# Patient Record
Sex: Male | Born: 1970 | Race: Black or African American | Hispanic: No | Marital: Single | Smoking: Former smoker
Health system: Southern US, Community
[De-identification: ages and names within clinical notes are randomized; demographics above are authoritative.]

## PROBLEM LIST (undated history)

## (undated) DIAGNOSIS — E119 Type 2 diabetes mellitus without complications: Secondary | ICD-10-CM

## (undated) DIAGNOSIS — K219 Gastro-esophageal reflux disease without esophagitis: Secondary | ICD-10-CM

## (undated) DIAGNOSIS — E782 Mixed hyperlipidemia: Secondary | ICD-10-CM

## (undated) DIAGNOSIS — I10 Essential (primary) hypertension: Secondary | ICD-10-CM

## (undated) DIAGNOSIS — G4730 Sleep apnea, unspecified: Secondary | ICD-10-CM

## (undated) HISTORY — PX: KNEE SURGERY: SHX244

---

## 2011-01-15 ENCOUNTER — Emergency Department (HOSPITAL_BASED_OUTPATIENT_CLINIC_OR_DEPARTMENT_OTHER)
Admission: EM | Admit: 2011-01-15 | Discharge: 2011-01-16 | Disposition: A | Discharge status: Home / Self Care | Payer: Self-pay | Attending: Emergency Medicine | Admitting: Emergency Medicine

## 2011-01-15 ENCOUNTER — Encounter: Payer: Self-pay | Admitting: *Deleted

## 2011-01-15 DIAGNOSIS — K219 Gastro-esophageal reflux disease without esophagitis: Secondary | ICD-10-CM | POA: Insufficient documentation

## 2011-01-15 DIAGNOSIS — R350 Frequency of micturition: Secondary | ICD-10-CM | POA: Insufficient documentation

## 2011-01-15 DIAGNOSIS — Z202 Contact with and (suspected) exposure to infections with a predominantly sexual mode of transmission: Secondary | ICD-10-CM | POA: Insufficient documentation

## 2011-01-15 DIAGNOSIS — I10 Essential (primary) hypertension: Secondary | ICD-10-CM | POA: Insufficient documentation

## 2011-01-15 HISTORY — DX: Gastro-esophageal reflux disease without esophagitis: K21.9

## 2011-01-15 HISTORY — DX: Essential (primary) hypertension: I10

## 2011-01-15 LAB — GLUCOSE, CAPILLARY: Glucose-Capillary: 91 mg/dL (ref 70–99)

## 2011-01-15 LAB — URINALYSIS, ROUTINE W REFLEX MICROSCOPIC
Bilirubin Urine: NEGATIVE
Glucose, UA: NEGATIVE mg/dL
Hgb urine dipstick: NEGATIVE
Protein, ur: NEGATIVE mg/dL
Urobilinogen, UA: 1 mg/dL (ref 0.0–1.0)

## 2011-01-15 NOTE — ED Notes (Signed)
Pt c/o bilateral side pain and urinary symptoms

## 2011-01-16 MED ORDER — AZITHROMYCIN 250 MG PO TABS
1000.0000 mg | ORAL_TABLET | Freq: Once | ORAL | Status: AC
  Administered 2011-01-16: 1000 mg via ORAL
  Filled 2011-01-16: qty 4

## 2011-01-16 MED ORDER — METRONIDAZOLE 500 MG PO TABS
2000.0000 mg | ORAL_TABLET | Freq: Once | ORAL | Status: AC
  Administered 2011-01-16: 2000 mg via ORAL
  Filled 2011-01-16: qty 4

## 2011-01-16 MED ORDER — CEFTRIAXONE SODIUM 250 MG IJ SOLR
250.0000 mg | Freq: Once | INTRAMUSCULAR | Status: AC
  Administered 2011-01-16: 250 mg via INTRAMUSCULAR
  Filled 2011-01-16: qty 250

## 2011-01-16 NOTE — ED Provider Notes (Signed)
Scribed for Hanley Seamen, MD, the patient was seen in room MH06/MH06 . This chart was scribed by Ellie Lunch.   CSN: [id number] Arrival date & time: 01/15/2011 11:10 PM   First MD Initiated Contact with Patient 01/16/11 0017      Chief Complaint  Patient presents with  . Urinary Frequency    (Consider location/radiation/quality/duration/timing/severity/associated sxs/prior treatment) HPI Cody [name] is a 40 y.o. male who presents to the Emergency Department complaining of dysuria. Pt reports that his partner dx with trichomoniasis last week. He is here to be tested for trichomoniasis and other STD's. Pt reports dysuria with associated pain in side and lower abdominal pain. Denies penile discharge, nausea, and vomiting. There are no other associated symptoms and no other alleviating or aggravating factors.    Past Medical History  Diagnosis Date  . Hypertension   . Acid reflux     History reviewed. No pertinent past surgical history.  History reviewed. No pertinent family history.  History  Substance Use Topics  . Smoking status: Current Everyday Smoker  . Smokeless tobacco: Not on file  . Alcohol Use: Yes    Review of Systems  Gastrointestinal: Positive for abdominal pain. Negative for nausea and vomiting.  Genitourinary: Positive for dysuria. Negative for discharge.  All other systems reviewed and are negative.    Allergies  Review of patient's allergies indicates no known allergies.  Home Medications   Current Outpatient Rx  Name Route Sig Dispense Refill  . LISINOPRIL-HYDROCHLOROTHIAZIDE 20-25 MG PO TABS Oral Take 1 tablet by mouth daily.      Marland Kitchen RANITIDINE HCL 150 MG PO TABS Oral Take 150 mg by mouth daily.        BP 149/93  Pulse 74  Temp(Src) 98 F (36.7 C) (Oral)  Resp 18  Ht 6\' 2"  (1.88 m)  Wt 300 lb (136.079 kg)  BMI 38.52 kg/m2  SpO2 100%  Physical Exam  Nursing note and vitals reviewed. Constitutional: He is oriented to person, place, and  time. He appears well-developed and well-nourished. No distress.  HENT:  Head: Normocephalic and atraumatic.  Eyes: Conjunctivae and EOM are normal.  Neck: Neck supple.  Cardiovascular: Normal rate, regular rhythm and normal heart sounds.   Pulmonary/Chest: Effort normal and breath sounds normal. No accessory muscle usage. No respiratory distress.  Abdominal: Soft. There is tenderness.       suprapubic tenderness  Genitourinary:       Normal external genital exam. Uncircumcised. No penile discharge.   Musculoskeletal: Normal range of motion.  Neurological: He is alert and oriented to person, place, and time.  Skin: Skin is warm and dry.  Psychiatric: He has a normal mood and affect.    ED Course  Procedures (including critical care time)  DIAGNOSTIC STUDIES: Oxygen Saturation is 100% on room air, normal by my interpretation.     MDM   Nursing notes and vitals signs, including pulse oximetry, reviewed.  Summary of this visit's results, reviewed by myself:  Labs:  Results for orders placed during the hospital encounter of 01/15/11  URINALYSIS, ROUTINE W REFLEX MICROSCOPIC      Component Value Range   Color, Urine YELLOW  YELLOW    Appearance CLEAR  CLEAR    Specific Gravity, Urine 1.019  1.005 - 1.030    pH 6.5  5.0 - 8.0    Glucose, UA NEGATIVE  NEGATIVE (mg/dL)   Hgb urine dipstick NEGATIVE  NEGATIVE    Bilirubin Urine NEGATIVE  NEGATIVE  Ketones, ur NEGATIVE  NEGATIVE (mg/dL)   Protein, ur NEGATIVE  NEGATIVE (mg/dL)   Urobilinogen, UA 1.0  0.0 - 1.0 (mg/dL)   Nitrite NEGATIVE  NEGATIVE    Leukocytes, UA NEGATIVE  NEGATIVE   GLUCOSE, CAPILLARY      Component Value Range   Glucose-Capillary 91  70 - 99 (mg/dL)   Imaging Studies: No results found.  I personally performed the services described in this documentation, which was scribed in my presence.  The recorded information has been reviewed and considered.  We'll treat for trichomoniasis given that his  girlfriend was diagnosed with this. His girlfriend is also on doxycycline suggesting that she was either diagnosed with, or suspect of having, chlamydia. We will thus treat for GC and Chlamydia as well.     Hanley Seamen, MD 01/16/11 [phone]

## 2011-01-17 LAB — GC/CHLAMYDIA PROBE AMP, GENITAL: GC Probe Amp, Genital: NEGATIVE

## 2015-08-19 ENCOUNTER — Emergency Department (HOSPITAL_BASED_OUTPATIENT_CLINIC_OR_DEPARTMENT_OTHER): Payer: No Typology Code available for payment source

## 2015-08-19 ENCOUNTER — Emergency Department (HOSPITAL_BASED_OUTPATIENT_CLINIC_OR_DEPARTMENT_OTHER)
Admission: EM | Admit: 2015-08-19 | Discharge: 2015-08-19 | Disposition: A | Discharge status: Home / Self Care | Payer: No Typology Code available for payment source | Attending: Emergency Medicine | Admitting: Emergency Medicine

## 2015-08-19 ENCOUNTER — Encounter (HOSPITAL_BASED_OUTPATIENT_CLINIC_OR_DEPARTMENT_OTHER): Payer: Self-pay

## 2015-08-19 DIAGNOSIS — Y999 Unspecified external cause status: Secondary | ICD-10-CM | POA: Diagnosis not present

## 2015-08-19 DIAGNOSIS — Z87891 Personal history of nicotine dependence: Secondary | ICD-10-CM | POA: Insufficient documentation

## 2015-08-19 DIAGNOSIS — I10 Essential (primary) hypertension: Secondary | ICD-10-CM | POA: Insufficient documentation

## 2015-08-19 DIAGNOSIS — M549 Dorsalgia, unspecified: Secondary | ICD-10-CM

## 2015-08-19 DIAGNOSIS — Y92410 Unspecified street and highway as the place of occurrence of the external cause: Secondary | ICD-10-CM | POA: Insufficient documentation

## 2015-08-19 DIAGNOSIS — M542 Cervicalgia: Secondary | ICD-10-CM | POA: Insufficient documentation

## 2015-08-19 DIAGNOSIS — Y939 Activity, unspecified: Secondary | ICD-10-CM | POA: Insufficient documentation

## 2015-08-19 MED ORDER — METHOCARBAMOL 500 MG PO TABS: 500.0000 mg | ORAL_TABLET | Freq: Four times a day (QID) | ORAL | Status: DC | PRN

## 2015-08-19 MED ORDER — IBUPROFEN 800 MG PO TABS: 800.0000 mg | ORAL_TABLET | Freq: Three times a day (TID) | ORAL | Status: DC | PRN

## 2015-08-19 MED ORDER — KETOROLAC TROMETHAMINE 60 MG/2ML IM SOLN
60.0000 mg | Freq: Once | INTRAMUSCULAR | Status: AC
  Administered 2015-08-19: 60 mg via INTRAMUSCULAR
  Filled 2015-08-19: qty 2
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
## 2015-08-19 NOTE — ED Notes (Signed)
PA at bedside.
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
## 2015-12-03 ENCOUNTER — Emergency Department (HOSPITAL_BASED_OUTPATIENT_CLINIC_OR_DEPARTMENT_OTHER)
Admission: EM | Admit: 2015-12-03 | Discharge: 2015-12-03 | Disposition: A | Discharge status: Home / Self Care | Payer: Self-pay | Attending: Emergency Medicine | Admitting: Emergency Medicine

## 2015-12-03 ENCOUNTER — Encounter (HOSPITAL_BASED_OUTPATIENT_CLINIC_OR_DEPARTMENT_OTHER): Payer: Self-pay | Admitting: Adult Health

## 2015-12-03 DIAGNOSIS — Z79899 Other long term (current) drug therapy: Secondary | ICD-10-CM | POA: Insufficient documentation

## 2015-12-03 DIAGNOSIS — I10 Essential (primary) hypertension: Secondary | ICD-10-CM | POA: Insufficient documentation

## 2015-12-03 DIAGNOSIS — R0602 Shortness of breath: Secondary | ICD-10-CM | POA: Insufficient documentation

## 2015-12-03 DIAGNOSIS — Z87891 Personal history of nicotine dependence: Secondary | ICD-10-CM | POA: Insufficient documentation

## 2015-12-03 DIAGNOSIS — L03115 Cellulitis of right lower limb: Secondary | ICD-10-CM | POA: Insufficient documentation

## 2015-12-03 LAB — BASIC METABOLIC PANEL
ANION GAP: 8 (ref 5–15)
BUN: 16 mg/dL (ref 6–20)
CALCIUM: 9.5 mg/dL (ref 8.9–10.3)
CO2: 26 mmol/L (ref 22–32)
Chloride: 102 mmol/L (ref 101–111)
Creatinine, Ser: 1.41 mg/dL — ABNORMAL HIGH (ref 0.61–1.24)
GFR, EST NON AFRICAN AMERICAN: 58 mL/min — AB (ref >60)
Glucose, Bld: 120 mg/dL — ABNORMAL HIGH (ref 65–99)
POTASSIUM: 4 mmol/L (ref 3.5–5.1)
Sodium: 136 mmol/L (ref 135–145)

## 2015-12-03 LAB — SEDIMENTATION RATE: Sed Rate: 1 mm/hr (ref 0–16)

## 2015-12-03 LAB — CBC WITH DIFFERENTIAL/PLATELET
BASOS ABS: 0 10*3/uL (ref 0.0–0.1)
Basophils Relative: 0 %
EOS PCT: 0 %
Eosinophils Absolute: 0 10*3/uL (ref 0.0–0.7)
HEMATOCRIT: 39.6 % (ref 39.0–52.0)
Hemoglobin: 13.7 g/dL (ref 13.0–17.0)
LYMPHS ABS: 0.9 10*3/uL (ref 0.7–4.0)
Lymphocytes Relative: 7 %
MCH: 32 pg (ref 26.0–34.0)
MCHC: 34.6 g/dL (ref 30.0–36.0)
MCV: 92.5 fL (ref 78.0–100.0)
MONO ABS: 0.5 10*3/uL (ref 0.1–1.0)
MONOS PCT: 4 %
NEUTROS PCT: 89 %
Neutro Abs: 11.9 10*3/uL — ABNORMAL HIGH (ref 1.7–7.7)
PLATELETS: 88 10*3/uL — AB (ref 150–400)
RBC: 4.28 MIL/uL (ref 4.22–5.81)
RDW: 12.6 % (ref 11.5–15.5)
WBC: 13.3 10*3/uL — AB (ref 4.0–10.5)

## 2015-12-03 MED ORDER — VANCOMYCIN HCL 10 G IV SOLR
2000.0000 mg | Freq: Once | INTRAVENOUS | Status: DC
  Filled 2015-12-03: qty 2000

## 2015-12-03 MED ORDER — DOXYCYCLINE HYCLATE 100 MG PO CAPS: 100.0000 mg | ORAL_CAPSULE | Freq: Two times a day (BID) | ORAL | 0 refills | Status: DC

## 2015-12-03 MED ORDER — ACETAMINOPHEN 500 MG PO TABS
1000.0000 mg | ORAL_TABLET | Freq: Once | ORAL | Status: AC
  Administered 2015-12-03: 1000 mg via ORAL
  Filled 2015-12-03: qty 2

## 2015-12-03 MED ORDER — CEPHALEXIN 500 MG PO CAPS: 500.0000 mg | ORAL_CAPSULE | Freq: Three times a day (TID) | ORAL | 0 refills | Status: DC

## 2015-12-03 MED ORDER — VANCOMYCIN HCL IN DEXTROSE 1-5 GM/200ML-% IV SOLN
1000.0000 mg | INTRAVENOUS | Status: AC
  Administered 2015-12-03 (×2): 1000 mg via INTRAVENOUS
  Filled 2015-12-03 (×2): qty 200

## 2015-12-03 NOTE — ED Provider Notes (Signed)
[facility] Provider Note   CSN: [id number] Arrival date & time: 12/03/15  1748  By signing my name below, I, Cody [name], attest that this documentation has been prepared under the direction and in the presence of Lyndal Pulley, MD . Electronically Signed: Nelwyn [name], Scribe. 12/03/2015. 6:17 PM.   History   Chief Complaint Chief Complaint  Patient presents with  . Leg Pain   The history is provided by the patient. No language interpreter was used.  Leg Pain   This is a new problem. The current episode started more than 2 days ago. The problem occurs constantly. The problem has been rapidly worsening. The pain is present in the right upper leg. The quality of the pain is described as constant ("Like my leg is on fire"). The pain is moderate. The symptoms are aggravated by activity and contact.   HPI Comments:  Cody [name] is a 45 y.o. male with PMHx of HTN who presents to the Emergency Department complaining of worsening constant right leg pain onset 5 days ago. He describes his pain as "like [his] leg is on fire", exacerbated by movement and palpation and rapidly worsening today. He endorses associated swelling, fever, nausea, and shortness of breath. Pt denies any hx of DM or kidney problems.   Past Medical History:  Diagnosis Date  . Acid reflux   . Hypertension     There are no active problems to display for this patient.   Past Surgical History:  Procedure Laterality Date  . KNEE SURGERY       Home Medications    Prior to Admission medications   Medication Sig Start Date End Date Taking? Authorizing Provider  ibuprofen (ADVIL,MOTRIN) 800 MG tablet Take 1 tablet (800 mg total) by mouth every 8 (eight) hours as needed. 08/19/15   Trixie Dredge, PA-C  lisinopril-hydrochlorothiazide (PRINZIDE,ZESTORETIC) 20-25 MG per tablet Take 1 tablet by mouth daily.      Historical Provider, MD  methocarbamol (ROBAXIN) 500 MG tablet Take 1-2 tablets (500-1,000 mg total) by  mouth every 6 (six) hours as needed for muscle spasms (and pain). 08/19/15   Trixie Dredge, PA-C  ranitidine (ZANTAC) 150 MG tablet Take 150 mg by mouth daily.      Historical Provider, MD    Family History History reviewed. No pertinent family history.  Social History Social History  Substance Use Topics  . Smoking status: Former Games developer  . Smokeless tobacco: Not on file  . Alcohol use No    Allergies   Review of patient's allergies indicates no known allergies.   Review of Systems Review of Systems  Constitutional: Positive for fever.  Respiratory: Positive for shortness of breath.   Gastrointestinal: Positive for nausea.  Musculoskeletal: Positive for arthralgias and myalgias.       Positive for Swelling  All other systems reviewed and are negative.    Physical Exam Updated Vital Signs BP (!) 185/110 (BP Location: Right Arm)   Pulse 87   Temp 100.8 F (38.2 C) (Oral)   Resp 22   SpO2 96%   Physical Exam  Constitutional: He appears well-developed and well-nourished.  HENT:  Head: Normocephalic and atraumatic.  Eyes: Conjunctivae are normal.  Neck: Neck supple.  Cardiovascular: Normal rate and regular rhythm.   No murmur heard. Pulmonary/Chest: No respiratory distress.  Abdominal: Soft. There is no tenderness.  Musculoskeletal: He exhibits no edema.  Neurological: He is alert.  Skin: Skin is warm and dry. There is erythema.  8cm area of  induration and erythema that is well demarcated extending to the posterior thigh from medial thigh. Tenderness over hamstring muscles.  Psychiatric: He has a normal mood and affect.  Nursing note and vitals reviewed.    ED Treatments / Results  DIAGNOSTIC STUDIES:  Oxygen Saturation is 96% on RA, adequate by my interpretation.    COORDINATION OF CARE:  7:03 PM Discussed treatment plan with pt at bedside which included imaging and antibiotics and pt agreed to plan.  Labs (all labs ordered are listed, but only abnormal  results are displayed) Labs Reviewed  CBC WITH DIFFERENTIAL/PLATELET - Abnormal; Notable for the following:       Result Value   WBC 13.3 (*)    Platelets 88 (*)    Neutro Abs 11.9 (*)    All other components within normal limits  BASIC METABOLIC PANEL - Abnormal; Notable for the following:    Glucose, Bld 120 (*)    Creatinine, Ser 1.41 (*)    GFR calc non Af Amer 58 (*)    All other components within normal limits  SEDIMENTATION RATE  C-REACTIVE PROTEIN    EKG  EKG Interpretation None       Radiology No results found.  Procedures Procedures (including critical care time)  Emergency Focused Ultrasound Exam Limited Ultrasound of Soft Tissue   Performed and interpreted by Dr. Clydene PughKnott Indication: evaluation for infection or foreign body Transverse and Sagittal views of right thigh are obtained in real time for the purposes of evaluation of skin and underlying soft tissues.  Findings: no heterogeneous fluid collection, + hyperemia/edema of surrounding tissue Interpretation: no abscess, + cellulitis Images archived electronically.  CPT Codes:   Lower extremity [phone]    Medications Ordered in ED Medications  acetaminophen (TYLENOL) tablet 1,000 mg (1,000 mg Oral Given 12/03/15 1917)  vancomycin (VANCOCIN) IVPB 1000 mg/200 mL premix (0 mg Intravenous Stopped 12/03/15 2202)     Initial Impression / Assessment and Plan / ED Course  I have reviewed the triage vital signs and the nursing notes.  Pertinent labs & imaging results that were available during my care of the patient were reviewed by me and considered in my medical decision making (see chart for details).  Clinical Course    45 y.o. male presents with apparent cellulitis of right medial thigh extending posteriorly. Progressed over last few days. No indication of traumatic onset. Demarcated area as pictured above has been spreading today. Distribution not c/w DVT but considered, after hours for doppler  evaluation which is not available. No elevation of inflammatory markers but low grade fever and WBC evident today. No crepitus or extension to groin to suggest nec fasc or fournier's. Dose of IV vancomycin provided here. Will place on doxy and keflex for double coverage of cellulitis. I recommended he return in 24 hours or less for recheck to monitor progress on treatment, would consider serial visits to monitor progression or admission for IV ABx if clinically worsening.   Final Clinical Impressions(s) / ED Diagnoses   Final diagnoses:  Cellulitis of right leg without foot    New Prescriptions Discharge Medication List as of 12/03/2015  9:18 PM    START taking these medications   Details  cephALEXin (KEFLEX) 500 MG capsule Take 1 capsule (500 mg total) by mouth 3 (three) times daily., Starting Sat 12/03/2015, Until Tue 12/13/2015, Print    doxycycline (VIBRAMYCIN) 100 MG capsule Take 1 capsule (100 mg total) by mouth 2 (two) times daily., Starting Sat 12/03/2015, Print  I personally performed the services described in this documentation, which was scribed in my presence. The recorded information has been reviewed and is accurate.       Lyndal Pulley, MD 12/04/15 [phone]

## 2015-12-03 NOTE — ED Notes (Signed)
MD at bedside. 

## 2015-12-03 NOTE — ED Triage Notes (Signed)
Presents with right leg pain and swelling-described as "it feels like my lg is on fire" began last night associatd with nausea and SOB.

## 2015-12-04 ENCOUNTER — Encounter (HOSPITAL_BASED_OUTPATIENT_CLINIC_OR_DEPARTMENT_OTHER): Payer: Self-pay | Admitting: Emergency Medicine

## 2015-12-04 ENCOUNTER — Inpatient Hospital Stay (HOSPITAL_COMMUNITY): Payer: Self-pay

## 2015-12-04 ENCOUNTER — Inpatient Hospital Stay (HOSPITAL_BASED_OUTPATIENT_CLINIC_OR_DEPARTMENT_OTHER)
Admission: EM | Admit: 2015-12-04 | Discharge: 2015-12-06 | DRG: 603 | Disposition: A | Discharge status: Home / Self Care | Payer: Self-pay | Attending: Internal Medicine | Admitting: Internal Medicine

## 2015-12-04 ENCOUNTER — Emergency Department (HOSPITAL_BASED_OUTPATIENT_CLINIC_OR_DEPARTMENT_OTHER): Payer: Self-pay

## 2015-12-04 DIAGNOSIS — L0390 Cellulitis, unspecified: Secondary | ICD-10-CM

## 2015-12-04 DIAGNOSIS — E119 Type 2 diabetes mellitus without complications: Secondary | ICD-10-CM

## 2015-12-04 DIAGNOSIS — Z79899 Other long term (current) drug therapy: Secondary | ICD-10-CM

## 2015-12-04 DIAGNOSIS — K219 Gastro-esophageal reflux disease without esophagitis: Secondary | ICD-10-CM | POA: Diagnosis present

## 2015-12-04 DIAGNOSIS — Z87891 Personal history of nicotine dependence: Secondary | ICD-10-CM

## 2015-12-04 DIAGNOSIS — L03115 Cellulitis of right lower limb: Principal | ICD-10-CM

## 2015-12-04 DIAGNOSIS — I10 Essential (primary) hypertension: Secondary | ICD-10-CM | POA: Diagnosis present

## 2015-12-04 DIAGNOSIS — N179 Acute kidney failure, unspecified: Secondary | ICD-10-CM | POA: Diagnosis present

## 2015-12-04 DIAGNOSIS — D696 Thrombocytopenia, unspecified: Secondary | ICD-10-CM | POA: Diagnosis present

## 2015-12-04 LAB — PROTIME-INR
INR: 1.14
PROTHROMBIN TIME: 14.7 s (ref 11.4–15.2)

## 2015-12-04 LAB — CBC WITH DIFFERENTIAL/PLATELET
Basophils Absolute: 0 10*3/uL (ref 0.0–0.1)
Basophils Relative: 0 %
EOS PCT: 1 %
Eosinophils Absolute: 0.2 10*3/uL (ref 0.0–0.7)
HEMATOCRIT: 38.8 % — AB (ref 39.0–52.0)
HEMOGLOBIN: 13.2 g/dL (ref 13.0–17.0)
LYMPHS ABS: 1.8 10*3/uL (ref 0.7–4.0)
Lymphocytes Relative: 11 %
MCH: 31.9 pg (ref 26.0–34.0)
MCHC: 34 g/dL (ref 30.0–36.0)
MCV: 93.7 fL (ref 78.0–100.0)
MONO ABS: 0.5 10*3/uL (ref 0.1–1.0)
MONOS PCT: 3 %
NEUTROS ABS: 13.8 10*3/uL — AB (ref 1.7–7.7)
Neutrophils Relative %: 85 %
Platelets: 85 10*3/uL — ABNORMAL LOW (ref 150–400)
RBC: 4.14 MIL/uL — AB (ref 4.22–5.81)
RDW: 12.7 % (ref 11.5–15.5)
WBC: 16.3 10*3/uL — ABNORMAL HIGH (ref 4.0–10.5)

## 2015-12-04 LAB — RETICULOCYTES
RBC.: 4.19 MIL/uL — ABNORMAL LOW (ref 4.22–5.81)
RETIC COUNT ABSOLUTE: 79.6 10*3/uL (ref 19.0–186.0)
Retic Ct Pct: 1.9 % (ref 0.4–3.1)

## 2015-12-04 LAB — C-REACTIVE PROTEIN: CRP: 0.5 mg/dL (ref <1.0)

## 2015-12-04 LAB — COMPREHENSIVE METABOLIC PANEL
ALBUMIN: 4.5 g/dL (ref 3.5–5.0)
ALK PHOS: 44 U/L (ref 38–126)
ALT: 43 U/L (ref 17–63)
AST: 28 U/L (ref 15–41)
Anion gap: 5 (ref 5–15)
BILIRUBIN TOTAL: 1 mg/dL (ref 0.3–1.2)
BUN: 22 mg/dL — AB (ref 6–20)
CALCIUM: 9.1 mg/dL (ref 8.9–10.3)
CO2: 27 mmol/L (ref 22–32)
Chloride: 103 mmol/L (ref 101–111)
Creatinine, Ser: 1.61 mg/dL — ABNORMAL HIGH (ref 0.61–1.24)
GFR calc Af Amer: 57 mL/min — ABNORMAL LOW (ref >60)
GFR calc non Af Amer: 49 mL/min — ABNORMAL LOW (ref >60)
GLUCOSE: 110 mg/dL — AB (ref 65–99)
Potassium: 3.7 mmol/L (ref 3.5–5.1)
Sodium: 135 mmol/L (ref 135–145)
TOTAL PROTEIN: 7.2 g/dL (ref 6.5–8.1)

## 2015-12-04 LAB — TSH: TSH: 3.85 u[IU]/mL (ref 0.350–4.500)

## 2015-12-04 LAB — LACTIC ACID, PLASMA: Lactic Acid, Venous: 1 mmol/L (ref 0.5–1.9)

## 2015-12-04 MED ORDER — VANCOMYCIN HCL IN DEXTROSE 1-5 GM/200ML-% IV SOLN
1000.0000 mg | Freq: Once | INTRAVENOUS | Status: AC
  Administered 2015-12-04: 1000 mg via INTRAVENOUS
  Filled 2015-12-04: qty 200

## 2015-12-04 MED ORDER — OXYCODONE-ACETAMINOPHEN 5-325 MG PO TABS
2.0000 | ORAL_TABLET | Freq: Once | ORAL | Status: AC
  Administered 2015-12-04: 2 via ORAL
  Filled 2015-12-04 (×2): qty 2

## 2015-12-04 MED ORDER — CEFAZOLIN SODIUM-DEXTROSE 2-4 GM/100ML-% IV SOLN
2.0000 g | Freq: Three times a day (TID) | INTRAVENOUS | Status: DC
  Administered 2015-12-04 – 2015-12-06 (×5): 2 g via INTRAVENOUS
  Filled 2015-12-04 (×8): qty 100

## 2015-12-04 MED ORDER — FAMOTIDINE 20 MG PO TABS
20.0000 mg | ORAL_TABLET | Freq: Two times a day (BID) | ORAL | Status: DC
  Administered 2015-12-04 – 2015-12-06 (×4): 20 mg via ORAL
  Filled 2015-12-04 (×4): qty 1

## 2015-12-04 MED ORDER — HYDROMORPHONE HCL 1 MG/ML IJ SOLN
1.0000 mg | Freq: Once | INTRAMUSCULAR | Status: AC
  Administered 2015-12-04: 1 mg via INTRAVENOUS
  Filled 2015-12-04: qty 1

## 2015-12-04 MED ORDER — CEFTRIAXONE SODIUM 1 G IJ SOLR: 1.0000 g | Freq: Once | INTRAMUSCULAR | Status: DC

## 2015-12-04 MED ORDER — OXYCODONE HCL 5 MG PO TABS
5.0000 mg | ORAL_TABLET | ORAL | Status: DC | PRN
  Administered 2015-12-05: 5 mg via ORAL
  Filled 2015-12-04: qty 1

## 2015-12-04 MED ORDER — HYDROMORPHONE HCL 1 MG/ML IJ SOLN
1.0000 mg | INTRAMUSCULAR | Status: DC | PRN
  Administered 2015-12-04 – 2015-12-05 (×3): 1 mg via INTRAVENOUS
  Filled 2015-12-04 (×3): qty 1

## 2015-12-04 MED ORDER — HYDROMORPHONE HCL 1 MG/ML IJ SOLN: 0.5000 mg | INTRAMUSCULAR | Status: DC | PRN

## 2015-12-04 MED ORDER — IOPAMIDOL (ISOVUE-300) INJECTION 61%
INTRAVENOUS | Status: AC
  Administered 2015-12-04: 100 mL
  Filled 2015-12-04: qty 100

## 2015-12-04 MED ORDER — SODIUM CHLORIDE 0.9 % IV SOLN
INTRAVENOUS | Status: DC
  Administered 2015-12-04: via INTRAVENOUS

## 2015-12-04 MED ORDER — VANCOMYCIN HCL 10 G IV SOLR
1500.0000 mg | Freq: Two times a day (BID) | INTRAVENOUS | Status: DC
  Administered 2015-12-05 – 2015-12-06 (×4): 1500 mg via INTRAVENOUS
  Filled 2015-12-04 (×5): qty 1500

## 2015-12-04 MED ORDER — ACETAMINOPHEN 325 MG PO TABS
650.0000 mg | ORAL_TABLET | ORAL | Status: DC | PRN
  Administered 2015-12-05 – 2015-12-06 (×2): 650 mg via ORAL
  Filled 2015-12-04 (×3): qty 2

## 2015-12-04 MED ORDER — AMLODIPINE BESYLATE 5 MG PO TABS
5.0000 mg | ORAL_TABLET | Freq: Every day | ORAL | Status: DC
  Administered 2015-12-04: 5 mg via ORAL
  Filled 2015-12-04: qty 1

## 2015-12-04 MED ORDER — SODIUM CHLORIDE 0.9 % IV SOLN
Freq: Once | INTRAVENOUS | Status: AC
  Administered 2015-12-04: 1000 mL via INTRAVENOUS

## 2015-12-04 MED ORDER — HYDRALAZINE HCL 20 MG/ML IJ SOLN
20.0000 mg | Freq: Four times a day (QID) | INTRAMUSCULAR | Status: DC | PRN
  Administered 2015-12-05 (×2): 20 mg via INTRAVENOUS
  Filled 2015-12-04 (×2): qty 1

## 2015-12-04 NOTE — ED Provider Notes (Signed)
[facility] Provider Note   CSN: [id number] Arrival date & time: 12/04/15  1302     History   Chief Complaint Chief Complaint  Patient presents with  . Follow-up    HPI Cody [name] is a 45 y.o. male.  The history is provided by the patient. No language interpreter was used.  Leg Pain   This is a new problem. The problem occurs constantly. The problem has been gradually worsening. The pain is present in the right upper leg and right toes. The quality of the pain is described as aching. The pain is at a severity of 7/10. The pain is moderate. He has tried nothing for the symptoms. The treatment provided no relief.  Pt seen here yesterday and diagnosed with cellulitis.  Pt reports increased swelling and increased pain.  Pt did not get rx for antibiotics.   Past Medical History:  Diagnosis Date  . Acid reflux   . Hypertension     There are no active problems to display for this patient.   Past Surgical History:  Procedure Laterality Date  . KNEE SURGERY         Home Medications    Prior to Admission medications   Medication Sig Start Date End Date Taking? Authorizing Provider  lisinopril-hydrochlorothiazide (PRINZIDE,ZESTORETIC) 20-25 MG per tablet Take 1 tablet by mouth daily.     Yes Historical Provider, MD  ranitidine (ZANTAC) 150 MG tablet Take 150 mg by mouth daily.     Yes Historical Provider, MD  cephALEXin (KEFLEX) 500 MG capsule Take 1 capsule (500 mg total) by mouth 3 (three) times daily. 12/03/15 12/13/15  Lyndal Pulley, MD  doxycycline (VIBRAMYCIN) 100 MG capsule Take 1 capsule (100 mg total) by mouth 2 (two) times daily. 12/03/15   Lyndal Pulley, MD  ibuprofen (ADVIL,MOTRIN) 800 MG tablet Take 1 tablet (800 mg total) by mouth every 8 (eight) hours as needed. 08/19/15   Trixie Dredge, PA-C  methocarbamol (ROBAXIN) 500 MG tablet Take 1-2 tablets (500-1,000 mg total) by mouth every 6 (six) hours as needed for muscle spasms (and pain). 08/19/15   Trixie Dredge,  PA-C    Family History History reviewed. No pertinent family history.  Social History Social History  Substance Use Topics  . Smoking status: Former Games developer  . Smokeless tobacco: Not on file  . Alcohol use No     Allergies   Review of patient's allergies indicates no known allergies.   Review of Systems Review of Systems  All other systems reviewed and are negative.    Physical Exam Updated Vital Signs BP (!) 157/110 (BP Location: Right Arm)   Pulse 74   Temp 98.7 F (37.1 C) (Oral)   Resp 20   Ht 6\' 2"  (1.88 m)   Wt (!) 151 kg   SpO2 97%   BMI 42.75 kg/m   Physical Exam  Constitutional: He appears well-developed and well-nourished.  HENT:  Head: Normocephalic and atraumatic.  Eyes: Conjunctivae are normal.  Neck: Neck supple.  Cardiovascular: Normal rate and regular rhythm.   No murmur heard. Pulmonary/Chest: Effort normal and breath sounds normal. No respiratory distress.  Abdominal: Soft. There is no tenderness.  Musculoskeletal: He exhibits edema and tenderness.  Large swollen discolored area left inner thigh,  Area has extended beyond markings.   Neurological: He is alert.  Skin: Skin is warm and dry.  Psychiatric: He has a normal mood and affect.  Nursing note and vitals reviewed.    ED Treatments / Results  Labs (all labs ordered are listed, but only abnormal results are displayed) Labs Reviewed  CBC WITH DIFFERENTIAL/PLATELET - Abnormal; Notable for the following:       Result Value   WBC 16.3 (*)    RBC 4.14 (*)    HCT 38.8 (*)    Platelets 85 (*)    Neutro Abs 13.8 (*)    All other components within normal limits  COMPREHENSIVE METABOLIC PANEL - Abnormal; Notable for the following:    Glucose, Bld 110 (*)    BUN 22 (*)    Creatinine, Ser 1.61 (*)    GFR calc non Af Amer 49 (*)    GFR calc Af Amer 57 (*)    All other components within normal limits    EKG  EKG Interpretation None       Radiology US Venous Img Lower  Unilateral Right  Result Date: 12/04/2015 EXAM: RIGHT LOWER EXTREMITY VENOUS DOPPLER ULTRASOUND TECHNIQUE: Gray-scale sonography with graded compression, as well as color Doppler and duplex ultrasound were performed to evaluate the lower extremity deep venous systems from the level of the common femoral vein and including the common femoral, femoral, profunda femoral, popliteal and calf veins including the posterior tibial, peroneal and gastrocnemius veins when visible. The superficial great saphenous vein was also interrogated. Spectral Doppler was utilized to evaluate flow at rest and with distal augmentation maneuvers in the common femoral, femoral and popliteal veins. COMPARISON:  None. FINDINGS: Contralateral Common Femoral Vein: Respiratory phasicity is normal and symmetric with the symptomatic side. No evidence of thrombus. Normal compressibility. Common Femoral Vein: No evidence of thrombus. Normal compressibility, respiratory phasicity and response to augmentation. Saphenofemoral Junction: No evidence of thrombus. Normal compressibility and flow on color Doppler imaging. Profunda Femoral Vein: No evidence of thrombus. Normal compressibility and flow on color Doppler imaging. Femoral Vein: No evidence of thrombus. Normal compressibility, respiratory phasicity and response to augmentation. Popliteal Vein: No evidence of thrombus. Normal compressibility, respiratory phasicity and response to augmentation. Calf Veins: No evidence of thrombus. Normal compressibility and flow on color Doppler imaging. The peroneal vein was not visualized. Superficial Great Saphenous Vein: No evidence of thrombus. Normal compressibility and flow on color Doppler imaging. Venous Reflux:  None. Other Findings: Multiple prominent nodes in the upper medial thigh with the largest measuring 2.2 x 0.8 x 3.4 cm. These are in the region of symptoms. IMPRESSION: No evidence of deep venous thrombosis. There are several prominent lymph  nodes in the upper medial thigh at the region of swelling. Electronically Signed   By: Gerome Sam III M.D   On: 12/04/2015 15:35    Procedures Procedures (including critical care time)  Medications Ordered in ED Medications  HYDROmorphone (DILAUDID) injection 1 mg (not administered)  0.9 %  sodium chloride infusion (1,000 mLs Intravenous New Bag/Given 12/04/15 1511)     Initial Impression / Assessment and Plan / ED Course  I have reviewed the triage vital signs and the nursing notes.  Pertinent labs & imaging results that were available during my care of the patient were reviewed by me and considered in my medical decision making (see chart for details).  Clinical Course    Due to increased swelling, redness and elevation of WBC's.  I will ask hospitalist to admit for treatment.  Final Clinical Impressions(s) / ED Diagnoses   Final diagnoses:  Cellulitis of leg, right    New Prescriptions New Prescriptions   No medications on file     Elson Areas,  PA-C 12/04/15 1622    Benjiman CoreNathan Pickering, MD 12/06/15 [phone]

## 2015-12-04 NOTE — ED Notes (Signed)
Report called to Daniel, RN.

## 2015-12-04 NOTE — Progress Notes (Signed)
Pharmacy Antibiotic Note Cody [name] is a 45 y.o. male admitted on 12/04/2015 with cellulitis.  Pharmacy has been consulted for Ancef and Vancomycin dosing.  Plan: 1. Vancomycin 1500 IV every 12 hours.  Goal trough 10-15 mcg/mL.  2. Ancef 2 grams IV every 8 hours. 3. SCr every 72 hours.   Height: 6\' 2"  (188 cm) Weight: (!) 333 lb (151 kg) IBW/kg (Calculated) : 82.2  Temp (24hrs), Avg:98.4 F (36.9 C), Min:98 F (36.7 C), Max:98.7 F (37.1 C)   Recent Labs Lab 12/03/15 1925 12/04/15 1515  WBC 13.3* 16.3*  CREATININE 1.41* 1.61*    Estimated Creatinine Clearance: 88 mL/min (by C-G formula based on SCr of 1.61 mg/dL).    No Known Allergies  Antimicrobials this admission: 9/10 vancomycin  >>  9/10 Ancef >>   Dose adjustments this admission: n/a  Microbiology results: 9/10 BCx: px   Thank you for allowing pharmacy to be a part of this patient's care.  Pollyann SamplesAndy Roizy Harold, PharmD, BCPS 12/04/2015, 9:42 PM Pager: [phone]

## 2015-12-04 NOTE — ED Notes (Signed)
Pt sent home with 2 Rx anitbiotics, filled one, could not fill the second due to cost.

## 2015-12-04 NOTE — H&P (Signed)
History and Physical    Cody [name] WUJ:[id number] DOB: 11/14/1970 DOA: 12/04/2015  PCP: The patient sees a Dr[facility] Hyacinth [facility]Miller [facility] [place]  [facility]  Chief Complaint: Progressive right thigh pain, swelling, redness  HPI: Cody [name] is a 45 y.o. gentleman with a history of HTN, GERD, and pre-diabetes (but no known history of renal disease) who developed right thigh pain and swelling two days ago.  No known history of trauma or bites.  He reports subjective fevers, chills, and sweats.  He has had light-headedness but no LOC.  Nausea, but no vomiting.  PO intake has been down.  He went to the ED yesterday for evaluation.  He received IV vancomycin and was discharged with prescriptions for oral doxycycline and keflex.  Both prescriptions were sent to the pharmacy but were never picked up.  The patient came back to the ED today for repeat evaluation.  He reports progressive symptoms over the past 24 hours.  ED Course: RLE ultrasound negative for DVT.  WBC increased from 13 to 16.  Platelet count stable around 85,000.  BUN 22.  Creatinine 1.6.  IV vancomycin ordered.  Hospitalist asked to admit.  Review of Systems: As per HPI otherwise 10 point review of systems negative.    Past Medical History:  Diagnosis Date  . Acid reflux   . Hypertension     Past Surgical History:  Procedure Laterality Date  . KNEE SURGERY       reports that he has quit smoking. He does not have any smokeless tobacco history on file. He reports that he does not drink alcohol or use drugs. He is a Scientist, research (physical sciences)freelance contractor.  He is uninsured.  He is engaged.  He has a 39six yo son.  No Known Allergies  FAMILY HISTORY: Maternal grandmother is deceased.  She had HTN and DM. Mother deceased.  She had HTN, DM, ESRD requiring HD.  Prior to Admission medications   Medication Sig Start Date End Date Taking? Authorizing Provider  lisinopril-hydrochlorothiazide  (PRINZIDE,ZESTORETIC) 20-25 MG per tablet Take 1 tablet by mouth daily.     Yes Historical Provider, MD  ranitidine (ZANTAC) 150 MG tablet Take 150 mg by mouth daily.     Yes Historical Provider, MD  cephALEXin (KEFLEX) 500 MG capsule Take 1 capsule (500 mg total) by mouth 3 (three) times daily. 12/03/15 12/13/15  Lyndal Pulleyaniel Knott, MD  doxycycline (VIBRAMYCIN) 100 MG capsule Take 1 capsule (100 mg total) by mouth 2 (two) times daily. 12/03/15   Lyndal Pulleyaniel Knott, MD    Physical Exam: Vitals:   12/04/15 1318 12/04/15 1617 12/04/15 1745 12/04/15 2115  BP:  (!) 168/111 (!) 188/102 (!) 185/11  Pulse:  63 71 71  Resp:  16 16   Temp:   98 F (36.7 C) 98.5 F (36.9 C)  TempSrc:    Oral  SpO2:  100% 97% 98%  Weight: (!) 151 kg (333 lb)     Height: 6\' 2"  (1.88 m)         Constitutional: NAD, calm, comfortable Vitals:   12/04/15 1318 12/04/15 1617 12/04/15 1745 12/04/15 2115  BP:  (!) 168/111 (!) 188/102 (!) 185/11  Pulse:  63 71 71  Resp:  16 16   Temp:   98 F (36.7 C) 98.5 F (36.9 C)  TempSrc:    Oral  SpO2:  100% 97% 98%  Weight: (!) 151 kg (333 lb)     Height: 6\' 2"  (1.88 m)  Eyes: PERRL, lids and conjunctivae normal ENMT: Mucous membranes are moist. Posterior pharynx clear of any exudate or lesions. Normal dentition.  Neck: normal appearance, supple, no masses Respiratory: clear to auscultation bilaterally, no wheezing, no crackles. Normal respiratory effort. No accessory muscle use.  Cardiovascular: Normal rate, regular rhythm, no murmurs / rubs / gallops. No extremity edema. 2+ pedal pulses. No carotid bruits.  GI: abdomen is soft and compressible.  No distention.  No tenderness.  No masses palpated.  Bowel sounds are present. Musculoskeletal:  No joint deformity in upper and lower extremities. Good ROM, no contractures. Normal muscle tone.  Skin: Right thigh has large area of erythema and induration.  Skin is warm to touch.  No ulcers or active drainage.  Mild fluctuance  He has dry  scaly skin on his feet.  There is a fissure at the base of one of his toes on his right foot.  No ulceration or active drainage. Neurologic: No focal deficits. Psychiatric: Normal judgment and insight. Alert and oriented x 3. Normal mood.     Labs on Admission: I have personally reviewed following labs and imaging studies  CBC:  Recent Labs Lab 12/03/15 1925 12/04/15 1515  WBC 13.3* 16.3*  NEUTROABS 11.9* 13.8*  HGB 13.7 13.2  HCT 39.6 38.8*  MCV 92.5 93.7  PLT 88* 85*   Basic Metabolic Panel:  Recent Labs Lab 12/03/15 1925 12/04/15 1515  NA 136 135  K 4.0 3.7  CL 102 103  CO2 26 27  GLUCOSE 120* 110*  BUN 16 22*  CREATININE 1.41* 1.61*  CALCIUM 9.5 9.1   GFR: Estimated Creatinine Clearance: 88 mL/min (by C-G formula based on SCr of 1.61 mg/dL). Liver Function Tests:  Recent Labs Lab 12/04/15 1515  AST 28  ALT 43  ALKPHOS 44  BILITOT 1.0  PROT 7.2  ALBUMIN 4.5   Urine analysis:    Component Value Date/Time   COLORURINE YELLOW 01/15/2011 2309   APPEARANCEUR CLEAR 01/15/2011 2309   LABSPEC 1.019 01/15/2011 2309   PHURINE 6.5 01/15/2011 2309   GLUCOSEU NEGATIVE 01/15/2011 2309   HGBUR NEGATIVE 01/15/2011 2309   BILIRUBINUR NEGATIVE 01/15/2011 2309   KETONESUR NEGATIVE 01/15/2011 2309   PROTEINUR NEGATIVE 01/15/2011 2309   UROBILINOGEN 1.0 01/15/2011 2309   NITRITE NEGATIVE 01/15/2011 2309   LEUKOCYTESUR NEGATIVE 01/15/2011 2309   Sepsis Labs:  Lactic acid level is 1  Radiological Exams on Admission: Ct Femur Right W Contrast  Result Date: 12/05/2015 CLINICAL DATA:  Cellulitis. Right thigh swelling, redness and tenderness. Concern for abscess. EXAM: CT OF THE LOWER RIGHT EXTREMITY WITH CONTRAST TECHNIQUE: Multidetector CT imaging of the lower right extremity was performed according to the standard protocol following intravenous contrast administration. COMPARISON:  None. CONTRAST:  100 cc Isovue-300 IV FINDINGS: Bones/Joint/Cartilage No acute  osseous abnormality. Right knee osteoarthritis with small joint effusion. No fracture or bony destructive change. Muscles and Tendons No evidence of intramuscular fluid collection allowing for lack contrast and body habitus. Quadriceps and included patellar tendons are intact. Soft tissues Skin thickening and reticulation involving the medial thigh skin and subcutaneous tissues. No soft tissue air. No focal fluid collection to suggest abscess. Separate from the inflammatory changes is today thin, 1 cm, crescentic prepatellar collection that is most consistent with bursitis. Prominent right inguinal lymph nodes. IMPRESSION: 1. Skin and soft tissue inflammation in the upper medial right thigh, most consistent with cellulitis. No evidence of subjacent abscess. 2. Right knee osteoarthritis with small joint effusion. Small amount of  prepatellar fluid is likely secondary to bursitis, and not adjacent to the thigh subcutaneous edema. Electronically Signed   By: Rubye Oaks M.D.   On: 12/05/2015 00:55   US Venous Img Lower Unilateral Right  Result Date: 12/04/2015 EXAM: RIGHT LOWER EXTREMITY VENOUS DOPPLER ULTRASOUND TECHNIQUE: Gray-scale sonography with graded compression, as well as color Doppler and duplex ultrasound were performed to evaluate the lower extremity deep venous systems from the level of the common femoral vein and including the common femoral, femoral, profunda femoral, popliteal and calf veins including the posterior tibial, peroneal and gastrocnemius veins when visible. The superficial great saphenous vein was also interrogated. Spectral Doppler was utilized to evaluate flow at rest and with distal augmentation maneuvers in the common femoral, femoral and popliteal veins. COMPARISON:  None. FINDINGS: Contralateral Common Femoral Vein: Respiratory phasicity is normal and symmetric with the symptomatic side. No evidence of thrombus. Normal compressibility. Common Femoral Vein: No evidence of  thrombus. Normal compressibility, respiratory phasicity and response to augmentation. Saphenofemoral Junction: No evidence of thrombus. Normal compressibility and flow on color Doppler imaging. Profunda Femoral Vein: No evidence of thrombus. Normal compressibility and flow on color Doppler imaging. Femoral Vein: No evidence of thrombus. Normal compressibility, respiratory phasicity and response to augmentation. Popliteal Vein: No evidence of thrombus. Normal compressibility, respiratory phasicity and response to augmentation. Calf Veins: No evidence of thrombus. Normal compressibility and flow on color Doppler imaging. The peroneal vein was not visualized. Superficial Great Saphenous Vein: No evidence of thrombus. Normal compressibility and flow on color Doppler imaging. Venous Reflux:  None. Other Findings: Multiple prominent nodes in the upper medial thigh with the largest measuring 2.2 x 0.8 x 3.4 cm. These are in the region of symptoms. IMPRESSION: No evidence of deep venous thrombosis. There are several prominent lymph nodes in the upper medial thigh at the region of swelling. Electronically Signed   By: Gerome Sam III M.D   On: 12/04/2015 15:35    Assessment/Plan Principal Problem:   Cellulitis of leg, right Active Problems:   AKI (acute kidney injury) (HCC)   Thrombocytopenia (HCC)   Cellulitis      Right thigh cellulitis without evidence of severe sepsis or abscess formation at this point --Admit to inpatient status as I suspect he will need at least a couple of days of IV antibiotics --IV ancef and vancomycin for now --Blood cultures pending --Analgesics as needed --Follow WBC count  AKI --Hydrate with NS --HOLD lisinopril-HCTZ combo tablet --Repeat BMP in the AM  Thrombocytopenia --etiology unclear --Serological work-up including HIV and hep C antibodies, B12 and folate levels  HTN --Manage with hydralazine and amlodipine for now  GERD --Pepcid BID    DVT  prophylaxis: SCDs (bleeding risk with low platelets) Code Status: FULL Family Communication: Patient's fiance present in the room at time of admission. Disposition Plan: Expect he will go home when evaluation is complete. Consults called: NONE Admission status: Inpatient, med surg   TIME SPENT: 75 minutes   Jerene Bears MD [facility] Pager [phone]  If 7PM-7AM, please contact night-coverage [email or web address] Password [facility]  12/04/2015, 9:38 PM

## 2015-12-04 NOTE — ED Triage Notes (Signed)
Patient c/o right leg pain, patient was seen here yesterday and d/c, advised to follow up today. Patient states he did not get all his medications filled as they were too expensive. Patient states that he noticed a wound to her right foot and thinks this may have been the source of the infection they were looking for last night.

## 2015-12-04 NOTE — ED Notes (Signed)
Patient transported to Ultrasound 

## 2015-12-05 ENCOUNTER — Encounter (HOSPITAL_COMMUNITY): Payer: Self-pay | Admitting: *Deleted

## 2015-12-05 DIAGNOSIS — K219 Gastro-esophageal reflux disease without esophagitis: Secondary | ICD-10-CM | POA: Diagnosis present

## 2015-12-05 DIAGNOSIS — N179 Acute kidney failure, unspecified: Secondary | ICD-10-CM

## 2015-12-05 DIAGNOSIS — I10 Essential (primary) hypertension: Secondary | ICD-10-CM | POA: Diagnosis present

## 2015-12-05 DIAGNOSIS — L03115 Cellulitis of right lower limb: Principal | ICD-10-CM

## 2015-12-05 DIAGNOSIS — D696 Thrombocytopenia, unspecified: Secondary | ICD-10-CM

## 2015-12-05 LAB — URINALYSIS, ROUTINE W REFLEX MICROSCOPIC
BILIRUBIN URINE: NEGATIVE
GLUCOSE, UA: 250 mg/dL — AB
Hgb urine dipstick: NEGATIVE
KETONES UR: NEGATIVE mg/dL
LEUKOCYTES UA: NEGATIVE
Nitrite: NEGATIVE
PH: 5.5 (ref 5.0–8.0)
Protein, ur: NEGATIVE mg/dL
SPECIFIC GRAVITY, URINE: 1.02 (ref 1.005–1.030)

## 2015-12-05 LAB — CBC WITH DIFFERENTIAL/PLATELET
BASOS PCT: 0 %
Basophils Absolute: 0 10*3/uL (ref 0.0–0.1)
EOS ABS: 0.3 10*3/uL (ref 0.0–0.7)
EOS PCT: 2 %
HCT: 37.3 % — ABNORMAL LOW (ref 39.0–52.0)
HEMOGLOBIN: 12.2 g/dL — AB (ref 13.0–17.0)
Lymphocytes Relative: 15 %
Lymphs Abs: 2 10*3/uL (ref 0.7–4.0)
MCH: 31.2 pg (ref 26.0–34.0)
MCHC: 32.7 g/dL (ref 30.0–36.0)
MCV: 95.4 fL (ref 78.0–100.0)
Monocytes Absolute: 0.6 10*3/uL (ref 0.1–1.0)
Monocytes Relative: 5 %
NEUTROS PCT: 79 %
Neutro Abs: 10.6 10*3/uL — ABNORMAL HIGH (ref 1.7–7.7)
PLATELETS: 78 10*3/uL — AB (ref 150–400)
RBC: 3.91 MIL/uL — ABNORMAL LOW (ref 4.22–5.81)
RDW: 13.3 % (ref 11.5–15.5)
WBC: 13.5 10*3/uL — ABNORMAL HIGH (ref 4.0–10.5)

## 2015-12-05 LAB — VITAMIN B12: Vitamin B-12: 286 pg/mL (ref 180–914)

## 2015-12-05 LAB — IRON AND TIBC
Iron: 92 ug/dL (ref 45–182)
Saturation Ratios: 30 % (ref 17.9–39.5)
TIBC: 307 ug/dL (ref 250–450)
UIBC: 215 ug/dL

## 2015-12-05 LAB — BASIC METABOLIC PANEL
Anion gap: 7 (ref 5–15)
BUN: 16 mg/dL (ref 6–20)
CALCIUM: 8.5 mg/dL — AB (ref 8.9–10.3)
CO2: 27 mmol/L (ref 22–32)
CREATININE: 1.39 mg/dL — AB (ref 0.61–1.24)
Chloride: 104 mmol/L (ref 101–111)
GFR, EST NON AFRICAN AMERICAN: 59 mL/min — AB (ref >60)
Glucose, Bld: 188 mg/dL — ABNORMAL HIGH (ref 65–99)
Potassium: 3.9 mmol/L (ref 3.5–5.1)
SODIUM: 138 mmol/L (ref 135–145)

## 2015-12-05 LAB — GLUCOSE, CAPILLARY
GLUCOSE-CAPILLARY: 147 mg/dL — AB (ref 65–99)
GLUCOSE-CAPILLARY: 129 mg/dL — AB (ref 65–99)
GLUCOSE-CAPILLARY: 127 mg/dL — AB (ref 65–99)

## 2015-12-05 LAB — HIV ANTIBODY (ROUTINE TESTING W REFLEX): HIV SCREEN 4TH GENERATION: NONREACTIVE

## 2015-12-05 LAB — SAVE SMEAR

## 2015-12-05 LAB — FERRITIN: FERRITIN: 300 ng/mL (ref 24–336)

## 2015-12-05 LAB — FOLATE: Folate: 10.5 ng/mL (ref >5.9)

## 2015-12-05 LAB — SEDIMENTATION RATE: SED RATE: 1 mm/h (ref 0–16)

## 2015-12-05 LAB — C-REACTIVE PROTEIN: CRP: 0.6 mg/dL (ref <1.0)

## 2015-12-05 MED ORDER — OXYCODONE-ACETAMINOPHEN 5-325 MG PO TABS
1.0000 | ORAL_TABLET | ORAL | Status: DC | PRN
  Administered 2015-12-05 – 2015-12-06 (×5): 2 via ORAL
  Filled 2015-12-05 (×5): qty 2

## 2015-12-05 MED ORDER — INSULIN ASPART 100 UNIT/ML ~~LOC~~ SOLN
0.0000 [IU] | Freq: Three times a day (TID) | SUBCUTANEOUS | Status: DC
  Administered 2015-12-05 – 2015-12-06 (×4): 1 [IU] via SUBCUTANEOUS

## 2015-12-05 MED ORDER — HYDRALAZINE HCL 25 MG PO TABS
25.0000 mg | ORAL_TABLET | Freq: Three times a day (TID) | ORAL | Status: DC
  Administered 2015-12-05 – 2015-12-06 (×4): 25 mg via ORAL
  Filled 2015-12-05 (×4): qty 1

## 2015-12-05 MED ORDER — INSULIN ASPART 100 UNIT/ML ~~LOC~~ SOLN: 0.0000 [IU] | Freq: Every day | SUBCUTANEOUS | Status: DC

## 2015-12-05 MED ORDER — POLYETHYLENE GLYCOL 3350 17 G PO PACK: 17.0000 g | PACK | Freq: Every day | ORAL | Status: DC | PRN

## 2015-12-05 MED ORDER — HYDROCODONE-ACETAMINOPHEN 5-325 MG PO TABS
1.0000 | ORAL_TABLET | ORAL | Status: DC | PRN
  Administered 2015-12-05: 1 via ORAL
  Filled 2015-12-05 (×2): qty 1

## 2015-12-05 MED ORDER — LISINOPRIL 20 MG PO TABS
20.0000 mg | ORAL_TABLET | Freq: Every day | ORAL | Status: DC
  Administered 2015-12-05 – 2015-12-06 (×2): 20 mg via ORAL
  Filled 2015-12-05 (×2): qty 1

## 2015-12-05 MED ORDER — LIVING WELL WITH DIABETES BOOK
Freq: Once | Status: AC
  Administered 2015-12-05: 16:00:00
  Filled 2015-12-05: qty 1

## 2015-12-05 MED ORDER — AMLODIPINE BESYLATE 5 MG PO TABS
5.0000 mg | ORAL_TABLET | Freq: Two times a day (BID) | ORAL | Status: DC
  Administered 2015-12-05 – 2015-12-06 (×3): 5 mg via ORAL
  Filled 2015-12-05 (×3): qty 1

## 2015-12-05 NOTE — Progress Notes (Addendum)
Inpatient Diabetes Program Recommendations  AACE/ADA: New Consensus Statement on Inpatient Glycemic Control (2015)  Target Ranges:  Prepandial:   less than 140 mg/dL      Peak postprandial:   less than 180 mg/dL (1-2 hours)      Critically ill patients:  140 - 180 mg/dL   Lab Results  Component Value Date   GLUCAP 147 (H) 12/05/2015    Review of Glycemic Control:  Results for Cody [name], Cody [name] (MRN [id number]) as of 12/05/2015 13:35  Ref. Range 12/05/2015 11:28  Glucose-Capillary Latest Ref Range: 65 - 99 mg/dL 119147 (H)   Diabetes history: New onset diabetes Outpatient Diabetes medications: None Current orders for Inpatient glycemic control:  Novolog sensitive tid with meals and HS  Inpatient Diabetes Program Recommendations:   Note new diagnosis of diabetes per MD.  Ordered Living Well with diabetes booklet for patient.  Will likely need oral medication for diabetes started at d/c and follow-up with PCP.  Thanks, Beryl MeagerJenny Shealee Yordy, RN, BC-ADM Inpatient Diabetes Coordinator Pager [phone])  1400 Spoke with patient briefly regarding diabetes.  He states he's never been told that he has diabetes but that MD had mentioned that his blood sugars were elevated.  Discussed that A1C results would give us more information regarding the 2-3 month average of glucose.  I discussed that I had ordered him a book about diabetes and briefly discussed diet and drink choices.  Patient states he is going to call his dr. When he gets out of the hospital, b/c he doesn't think that he has diabetes. He also seemed opposed to booklet about diabetes at this point.  Briefly discussed importance of glucose control for healing but patient not engaged at this point.  Will follow and assess A1C results when available.

## 2015-12-05 NOTE — Progress Notes (Signed)
Progress Note    Cody [name]  ZOX:[id number] DOB: 11-23-1970  DOA: 12/04/2015 PCP: Pcp Not In System    Brief Narrative:   Chief complaint: Follow-up thrombocytopenia and right thigh cellulitis/abscess  Cody [name] is an 45 y.o. male with a PMH of hypertension and GERD, but no prior history of renal disease, who was admitted 12/04/15 for evaluation of right thigh pain and swelling. Doppler studies were negative for DVT. CT revealed findings consistent with cellulitis. He was also incidentally discovered to have thrombocytopenia and acute kidney injury.  Assessment/Plan:   Principal Problem:   Cellulitis of leg, right CT negative for abscess. Empirically placed on Ancef and vancomycin. Follow-up blood cultures.  Active Problems:   AKI (acute kidney injury) (HCC) Continue to hydrate and monitor creatinine closely while on vancomycin given association with nephrotoxicity. Lisinopril/HCTZ remain on hold.    Thrombocytopenia (HCC) May be associated with acute infection. Anemia panel unremarkable. Follow-up HIV/hepatitis C antibodies.    Essential hypertension Blood pressure reasonable this morning. Continue amlodipine    GERD (gastroesophageal reflux disease) Continue Pepcid.    Newly diagnosed diabetes mellitus Patient has a fasting glucose of 188, consistent with a new diagnosis of diabetes. Hemoglobin A1c pending. This will help to determine if he has persistent elevation of his blood glucoses or if they are temporarily elevated due to infection. Counseled the patient extensively regarding his elevated blood glucoses. He has poor health literacy and seemed to think that taking insulin would "cause diabetes ". Will ask diabetes coordinator to counsel patient. Change diet to carbohydrate modified.   Family Communication/Anticipated D/C date and plan/Code Status   DVT prophylaxis: SCDs ordered. Code Status: Full Code.  Family Communication: No family currently at the  bedside. Disposition Plan: Home when can safely transition to oral antibiotics.   Medical Consultants:    None.   Procedures:    None  Anti-Infectives:    Vancomycin 12/04/15--->  Ancef 12/04/15--->  Subjective:   The patient reports that he is having 8/10 aching, throbbing, sharp right thigh pain, associated with skin senstivity. Review of symptoms is positive for mild constipation and negative for nausea, vomiting, diarrhea, fever/chills.  Objective:    Vitals:   12/04/15 2115 12/05/15 0451 12/05/15 1223 12/05/15 1345  BP: (!) 185/11 139/79 (!) 174/60 (!) 171/94  Pulse: 71 (!) 59  76  Resp:      Temp: 98.5 F (36.9 C) 97.9 F (36.6 C)    TempSrc: Oral Oral    SpO2: 98% 98%    Weight:      Height:        Intake/Output Summary (Last 24 hours) at 12/05/15 1359 Last data filed at 12/05/15 0900  Gross per 24 hour  Intake              940 ml  Output             1050 ml  Net             -110 ml   Filed Weights   12/04/15 1318  Weight: (!) 151 kg (333 lb)    Exam: General exam: Appears calm and comfortable.  Respiratory system: Clear to auscultation. Respiratory effort normal. Cardiovascular system: S1 & S2 heard, RRR. No JVD,  rubs, gallops or clicks. No murmurs. Gastrointestinal system: Abdomen is nondistended, soft and nontender. No organomegaly or masses felt. Normal bowel sounds heard. Central nervous system: Alert and oriented. No focal neurological deficits. Extremities: No clubbing, or cyanosis. Skin:  Right thigh cellulitis has pictured below. Psychiatry: Judgement and insight appear normal. Mood & affect appropriate.      Data Reviewed:   I have personally reviewed following labs and imaging studies:  Labs: Basic Metabolic Panel:  Recent Labs Lab 12/03/15 1925 12/04/15 1515 12/05/15 0342  NA 136 135 138  K 4.0 3.7 3.9  CL 102 103 104  CO2 26 27 27   GLUCOSE 120* 110* 188*  BUN 16 22* 16  CREATININE 1.41* 1.61* 1.39*  CALCIUM 9.5  9.1 8.5*   GFR Estimated Creatinine Clearance: 101.9 mL/min (by C-G formula based on SCr of 1.39 mg/dL). Liver Function Tests:  Recent Labs Lab 12/04/15 1515  AST 28  ALT 43  ALKPHOS 44  BILITOT 1.0  PROT 7.2  ALBUMIN 4.5   Coagulation profile  Recent Labs Lab 12/04/15 2219  INR 1.14    CBC:  Recent Labs Lab 12/03/15 1925 12/04/15 1515 12/05/15 0342  WBC 13.3* 16.3* 13.5*  NEUTROABS 11.9* 13.8* 10.6*  HGB 13.7 13.2 12.2*  HCT 39.6 38.8* 37.3*  MCV 92.5 93.7 95.4  PLT 88* 85* 78*   CBG:  Recent Labs Lab 12/05/15 1128  GLUCAP 147*   Hgb A1c: No results for input(s): HGBA1C in the last 72 hours. Thyroid function studies:  Recent Labs  12/04/15 2219  TSH 3.850   Anemia work up:  Recent Labs  12/04/15 2219  VITAMINB12 286  FOLATE 10.5  FERRITIN 300  TIBC 307  IRON 92  RETICCTPCT 1.9   Sepsis Labs:  Recent Labs Lab 12/03/15 1925 12/04/15 1515 12/04/15 2219 12/05/15 0342  WBC 13.3* 16.3*  --  13.5*  LATICACIDVEN  --   --  1.0  --     Microbiology No results found for this or any previous visit (from the past 240 hour(s)).  Radiology: Ct Femur Right W Contrast  Result Date: 12/05/2015 CLINICAL DATA:  Cellulitis. Right thigh swelling, redness and tenderness. Concern for abscess. EXAM: CT OF THE LOWER RIGHT EXTREMITY WITH CONTRAST TECHNIQUE: Multidetector CT imaging of the lower right extremity was performed according to the standard protocol following intravenous contrast administration. COMPARISON:  None. CONTRAST:  100 cc Isovue-300 IV FINDINGS: Bones/Joint/Cartilage No acute osseous abnormality. Right knee osteoarthritis with small joint effusion. No fracture or bony destructive change. Muscles and Tendons No evidence of intramuscular fluid collection allowing for lack contrast and body habitus. Quadriceps and included patellar tendons are intact. Soft tissues Skin thickening and reticulation involving the medial thigh skin and  subcutaneous tissues. No soft tissue air. No focal fluid collection to suggest abscess. Separate from the inflammatory changes is today thin, 1 cm, crescentic prepatellar collection that is most consistent with bursitis. Prominent right inguinal lymph nodes. IMPRESSION: 1. Skin and soft tissue inflammation in the upper medial right thigh, most consistent with cellulitis. No evidence of subjacent abscess. 2. Right knee osteoarthritis with small joint effusion. Small amount of prepatellar fluid is likely secondary to bursitis, and not adjacent to the thigh subcutaneous edema. Electronically Signed   By: Rubye Oaks M.D.   On: 12/05/2015 00:55   US Venous Img Lower Unilateral Right  Result Date: 12/04/2015 EXAM: RIGHT LOWER EXTREMITY VENOUS DOPPLER ULTRASOUND TECHNIQUE: Gray-scale sonography with graded compression, as well as color Doppler and duplex ultrasound were performed to evaluate the lower extremity deep venous systems from the level of the common femoral vein and including the common femoral, femoral, profunda femoral, popliteal and calf veins including the posterior tibial, peroneal and gastrocnemius veins when  visible. The superficial great saphenous vein was also interrogated. Spectral Doppler was utilized to evaluate flow at rest and with distal augmentation maneuvers in the common femoral, femoral and popliteal veins. COMPARISON:  None. FINDINGS: Contralateral Common Femoral Vein: Respiratory phasicity is normal and symmetric with the symptomatic side. No evidence of thrombus. Normal compressibility. Common Femoral Vein: No evidence of thrombus. Normal compressibility, respiratory phasicity and response to augmentation. Saphenofemoral Junction: No evidence of thrombus. Normal compressibility and flow on color Doppler imaging. Profunda Femoral Vein: No evidence of thrombus. Normal compressibility and flow on color Doppler imaging. Femoral Vein: No evidence of thrombus. Normal compressibility,  respiratory phasicity and response to augmentation. Popliteal Vein: No evidence of thrombus. Normal compressibility, respiratory phasicity and response to augmentation. Calf Veins: No evidence of thrombus. Normal compressibility and flow on color Doppler imaging. The peroneal vein was not visualized. Superficial Great Saphenous Vein: No evidence of thrombus. Normal compressibility and flow on color Doppler imaging. Venous Reflux:  None. Other Findings: Multiple prominent nodes in the upper medial thigh with the largest measuring 2.2 x 0.8 x 3.4 cm. These are in the region of symptoms. IMPRESSION: No evidence of deep venous thrombosis. There are several prominent lymph nodes in the upper medial thigh at the region of swelling. Electronically Signed   By: Gerome Samavid  Williams III M.D   On: 12/04/2015 15:35    Medications:   . amLODipine  5 mg Oral BID  .  ceFAZolin (ANCEF) IV  2 g Intravenous Q8H  . famotidine  20 mg Oral BID  . hydrALAZINE  25 mg Oral TID  . insulin aspart  0-5 Units Subcutaneous QHS  . insulin aspart  0-9 Units Subcutaneous TID WC  . living well with diabetes book   Does not apply Once  . vancomycin  1,500 mg Intravenous Q12H   Continuous Infusions: . sodium chloride 75 mL/hr at 12/04/15 2342    Patient requires intensive monitoring of renal function due to treatment with vancomycin and high risk for renal toxicity given his underlying acute kidney injury. Medical decision making is of high complexity and this patient is at high risk of deterioration, therefore this is a level 3 visit.    LOS: 1 day   Aubrei Bouchie  [facility] Pager [phone]. If unable to reach me by pager, please call my cell phone at [phone].  *Please refer to [email or web address], password [email or web address] to get updated schedule on who will round on this patient, as hospitalists switch teams weekly. If 7PM-7AM, please contact night-coverage at [email or web address], password [email or web address] for any overnight needs.  12/05/2015, 1:59 PM

## 2015-12-06 DIAGNOSIS — E119 Type 2 diabetes mellitus without complications: Secondary | ICD-10-CM

## 2015-12-06 LAB — HEMOGLOBIN A1C
HEMOGLOBIN A1C: 6.5 % — AB (ref 4.8–5.6)
Mean Plasma Glucose: 140 mg/dL

## 2015-12-06 LAB — BASIC METABOLIC PANEL
Anion gap: 9 (ref 5–15)
BUN: 9 mg/dL (ref 6–20)
CALCIUM: 9 mg/dL (ref 8.9–10.3)
CHLORIDE: 105 mmol/L (ref 101–111)
CO2: 23 mmol/L (ref 22–32)
CREATININE: 1.25 mg/dL — AB (ref 0.61–1.24)
GFR calc non Af Amer: >60 mL/min (ref >60)
Glucose, Bld: 131 mg/dL — ABNORMAL HIGH (ref 65–99)
Potassium: 4 mmol/L (ref 3.5–5.1)
SODIUM: 137 mmol/L (ref 135–145)

## 2015-12-06 LAB — GLUCOSE, CAPILLARY
GLUCOSE-CAPILLARY: 137 mg/dL — AB (ref 65–99)
Glucose-Capillary: 132 mg/dL — ABNORMAL HIGH (ref 65–99)

## 2015-12-06 LAB — HEPATITIS C ANTIBODY

## 2015-12-06 MED ORDER — TRAMADOL HCL 50 MG PO TABS
50.0000 mg | ORAL_TABLET | Freq: Four times a day (QID) | ORAL | Status: DC | PRN
  Administered 2015-12-06: 50 mg via ORAL
  Filled 2015-12-06: qty 1

## 2015-12-06 MED ORDER — DOXYCYCLINE HYCLATE 100 MG PO CAPS: 100.0000 mg | ORAL_CAPSULE | Freq: Two times a day (BID) | ORAL | 0 refills | Status: DC

## 2015-12-06 NOTE — Progress Notes (Signed)
Pt being discharged home via wheelchair with family. Pt alert and oriented x4. VSS. Pt c/o no pain at this time. No signs of respiratory distress. Education complete and care plans resolved. IV removed with catheter intact and pt tolerated well. No further issues at this time. Pt to follow up with PCP. Tamsen Reist R, RN 

## 2015-12-06 NOTE — Discharge Instructions (Signed)

## 2015-12-06 NOTE — Discharge Summary (Addendum)
Physician Discharge Summary  [facility] ZOX:[id number] DOB: 1970/10/10 DOA: 12/04/2015  PCP: Pcp Not In System: Dr. Jacquelyne Balint at [facility]  Admit date: 12/04/2015 Discharge date: 12/06/2015  Admitted From: Home Discharge disposition: Home    Recommendations for Outpatient Follow-Up:   1. Recommend close F/U of the patient's newly diagnosed diabetes.  He did not accept his diagnosis and refused to discuss any kind of education/treatment for this.   2. PCP please follow up on final blood culture results. He also will need F/U of thrombocytopenia.   Discharge Diagnosis:   Principal Problem:    Cellulitis of leg, right Active Problems:    AKI (acute kidney injury) (HCC)    Thrombocytopenia (HCC)    Essential hypertension    GERD (gastroesophageal reflux disease)    New onset type 2 diabetes mellitus (HCC)   Discharge Condition: Improved.  Diet recommendation: Low sodium, heart healthy.  Carbohydrate-modified.     History of Present Illness:   Cody [name] is an 45 y.o. male with a PMH of hypertension and GERD, but no prior history of renal disease, who was admitted 12/04/15 for evaluation of right thigh pain and swelling. Doppler studies were negative for DVT. CT revealed findings consistent with cellulitis. He was also incidentally discovered to have thrombocytopenia and acute kidney injury.  Hospital Course by Problem:   Principal Problem:   Cellulitis of leg, right CT negative for abscess. Empirically placed on Ancef and vancomycin, with improved appearance of cellulitis.  He had not started any antibiotics prior to admission as he could not afford the Keflex, and had not yet picked up his script for doxy. I advised him to have his pharmacist page me if he could not afford the doxycycline. Declined my offer to have the case management see if he is eligible for help with his prescriptions. Follow-up blood cultures.  Active Problems:   AKI (acute kidney injury)  (HCC) Lisinopril/HCTZ held on admission secondary to AKI, creatinine improved at D/C, OK to resume.    Thrombocytopenia (HCC) May be associated with acute infection. Anemia panel unremarkable. HIV/hepatitis C negative.    Essential hypertension Blood pressure suboptimally controlled. Continue amlodipine    GERD (gastroesophageal reflux disease) Continue Pepcid.   New onset diabetes Hemoglobin A1c is 6.5%, consistent with new onset diabetes. Counseled the patient extensively regarding his elevated blood glucoses. Likely can be controlled with diet changes weight loss. Counseled by diabetes coordinator. The patient is in denial about this and was resistant to discussing. I strongly advised him to F/U with his PCP.   Medical Consultants:    None.   Discharge Exam:   Vitals:   12/05/15 2357 12/06/15 0440  BP: (!) 158/80 (!) 143/78  Pulse:  62  Resp:  20  Temp:  98.2 F (36.8 C)   Vitals:   12/05/15 2000 12/05/15 2111 12/05/15 2357 12/06/15 0440  BP: (!) 191/83 (!) 180/92 (!) 158/80 (!) 143/78  Pulse: (!) 110   62  Resp: 18   20  Temp: 98.3 F (36.8 C)   98.2 F (36.8 C)  TempSrc: Oral   Oral  SpO2: 100%   98%  Weight:      Height:        General exam: Appears comfortable.  Respiratory system: Clear to auscultation. Respiratory effort normal. Cardiovascular system: S1 & S2 heard, RRR. No JVD,  rubs, gallops or clicks. No murmurs. Gastrointestinal system: Abdomen is nondistended, soft and nontender. No organomegaly or masses felt. Normal bowel sounds  heard. Central nervous system: Alert and oriented. No focal neurological deficits. Extremities: No clubbing, or cyanosis. Skin: Right thigh cellulitis less erythematous today. Psychiatry: Judgement and insight appear normal. Mood & affect appropriate.   Appearance of cellulitis on admission:     The results of significant diagnostics from this hospitalization (including imaging, microbiology, ancillary and  laboratory) are listed below for reference.     Procedures and Diagnostic Studies:   Ct Femur Right W Contrast  Result Date: 12/05/2015 CLINICAL DATA:  Cellulitis. Right thigh swelling, redness and tenderness. Concern for abscess. EXAM: CT OF THE LOWER RIGHT EXTREMITY WITH CONTRAST TECHNIQUE: Multidetector CT imaging of the lower right extremity was performed according to the standard protocol following intravenous contrast administration. COMPARISON:  None. CONTRAST:  100 cc Isovue-300 IV FINDINGS: Bones/Joint/Cartilage No acute osseous abnormality. Right knee osteoarthritis with small joint effusion. No fracture or bony destructive change. Muscles and Tendons No evidence of intramuscular fluid collection allowing for lack contrast and body habitus. Quadriceps and included patellar tendons are intact. Soft tissues Skin thickening and reticulation involving the medial thigh skin and subcutaneous tissues. No soft tissue air. No focal fluid collection to suggest abscess. Separate from the inflammatory changes is today thin, 1 cm, crescentic prepatellar collection that is most consistent with bursitis. Prominent right inguinal lymph nodes. IMPRESSION: 1. Skin and soft tissue inflammation in the upper medial right thigh, most consistent with cellulitis. No evidence of subjacent abscess. 2. Right knee osteoarthritis with small joint effusion. Small amount of prepatellar fluid is likely secondary to bursitis, and not adjacent to the thigh subcutaneous edema. Electronically Signed   By: Rubye Oaks M.D.   On: 12/05/2015 00:55   US Venous Img Lower Unilateral Right  Result Date: 12/04/2015 EXAM: RIGHT LOWER EXTREMITY VENOUS DOPPLER ULTRASOUND TECHNIQUE: Gray-scale sonography with graded compression, as well as color Doppler and duplex ultrasound were performed to evaluate the lower extremity deep venous systems from the level of the common femoral vein and including the common femoral, femoral, profunda  femoral, popliteal and calf veins including the posterior tibial, peroneal and gastrocnemius veins when visible. The superficial great saphenous vein was also interrogated. Spectral Doppler was utilized to evaluate flow at rest and with distal augmentation maneuvers in the common femoral, femoral and popliteal veins. COMPARISON:  None. FINDINGS: Contralateral Common Femoral Vein: Respiratory phasicity is normal and symmetric with the symptomatic side. No evidence of thrombus. Normal compressibility. Common Femoral Vein: No evidence of thrombus. Normal compressibility, respiratory phasicity and response to augmentation. Saphenofemoral Junction: No evidence of thrombus. Normal compressibility and flow on color Doppler imaging. Profunda Femoral Vein: No evidence of thrombus. Normal compressibility and flow on color Doppler imaging. Femoral Vein: No evidence of thrombus. Normal compressibility, respiratory phasicity and response to augmentation. Popliteal Vein: No evidence of thrombus. Normal compressibility, respiratory phasicity and response to augmentation. Calf Veins: No evidence of thrombus. Normal compressibility and flow on color Doppler imaging. The peroneal vein was not visualized. Superficial Great Saphenous Vein: No evidence of thrombus. Normal compressibility and flow on color Doppler imaging. Venous Reflux:  None. Other Findings: Multiple prominent nodes in the upper medial thigh with the largest measuring 2.2 x 0.8 x 3.4 cm. These are in the region of symptoms. IMPRESSION: No evidence of deep venous thrombosis. There are several prominent lymph nodes in the upper medial thigh at the region of swelling. Electronically Signed   By: Gerome Sam III M.D   On: 12/04/2015 15:35     Labs:   Basic  Metabolic Panel:  Recent Labs Lab 12/03/15 1925 12/04/15 1515 12/05/15 0342 12/06/15 0344  NA 136 135 138 137  K 4.0 3.7 3.9 4.0  CL 102 103 104 105  CO2 26 27 27 23   GLUCOSE 120* 110* 188* 131*    BUN 16 22* 16 9  CREATININE 1.41* 1.61* 1.39* 1.25*  CALCIUM 9.5 9.1 8.5* 9.0   GFR Estimated Creatinine Clearance: 113.4 mL/min (by C-G formula based on SCr of 1.25 mg/dL). Liver Function Tests:  Recent Labs Lab 12/04/15 1515  AST 28  ALT 43  ALKPHOS 44  BILITOT 1.0  PROT 7.2  ALBUMIN 4.5   Coagulation profile  Recent Labs Lab 12/04/15 2219  INR 1.14    CBC:  Recent Labs Lab 12/03/15 1925 12/04/15 1515 12/05/15 0342  WBC 13.3* 16.3* 13.5*  NEUTROABS 11.9* 13.8* 10.6*  HGB 13.7 13.2 12.2*  HCT 39.6 38.8* 37.3*  MCV 92.5 93.7 95.4  PLT 88* 85* 78*   CBG:  Recent Labs Lab 12/05/15 1128 12/05/15 1641 12/05/15 2234 12/06/15 0645 12/06/15 1148  GLUCAP 147* 129* 127* 137* 132*   Hgb A1c  Recent Labs  12/04/15 2219  HGBA1C 6.5*   Thyroid function studies  Recent Labs  12/04/15 2219  TSH 3.850   Anemia work up  Recent Labs  12/04/15 2219  VITAMINB12 286  FOLATE 10.5  FERRITIN 300  TIBC 307  IRON 92  RETICCTPCT 1.9   Microbiology No results found for this or any previous visit (from the past 240 hour(s)).   Discharge Instructions:   Discharge Instructions    Call MD for:  severe uncontrolled pain    Complete by:  As directed   Call MD for:  temperature >100.4    Complete by:  As directed   Diet - low sodium heart healthy    Complete by:  As directed   Diet Carb Modified    Complete by:  As directed   Discharge instructions    Complete by:  As directed   Your blood tests show that your blood sugars are high and that your average blood sugar has been about 140 over the past 3 months.  High blood sugars can cause problems with your health.  You now have mild diabetes, which can likely be controlled with diet and exercise.  You should follow up with your regular doctor to discuss your test results and treatment options more closely.   Increase activity slowly    Complete by:  As directed       Medication List    STOP taking these  medications   cephALEXin 500 MG capsule Commonly known as:  KEFLEX     TAKE these medications   doxycycline 100 MG capsule Commonly known as:  VIBRAMYCIN Take 1 capsule (100 mg total) by mouth 2 (two) times daily.   lisinopril-hydrochlorothiazide 20-25 MG tablet Commonly known as:  PRINZIDE,ZESTORETIC Take 1 tablet by mouth daily.   ranitidine 150 MG tablet Commonly known as:  ZANTAC Take 150 mg by mouth daily.      Follow-up Information    Dr. Terance IceBrooke Crossley. Schedule an appointment as soon as possible for a visit in 1 week(s).   Why:  Hospital follow up of your cellulitis and high blood sugars.           Time coordinating discharge: > 35 minutes.  Signed:  Deyonte Cadden  Pager [phone] [facility] 12/06/2015, 2:23 PM

## 2015-12-07 NOTE — Progress Notes (Signed)
Responded to consult b/c patient had major life  Transition. Patient was concerned about how his diagnosis of diabetes would affect his life, health, and family relationships -- especially his 47-yr-old son. Patient's mother and a grandparent had died after complication with diabetes. Spiritual, emotional support and prayer provided. Patient will be discharged soon.   12/04/15 1321  Advance Directives (For Healthcare)  Does patient have an advance directive? No  Would patient like information on creating an advanced directive? No - patient declined information

## 2015-12-10 LAB — CULTURE, BLOOD (ROUTINE X 2)
CULTURE: NO GROWTH
CULTURE: NO GROWTH

## 2017-04-21 ENCOUNTER — Other Ambulatory Visit: Payer: Self-pay

## 2017-04-21 ENCOUNTER — Emergency Department (HOSPITAL_BASED_OUTPATIENT_CLINIC_OR_DEPARTMENT_OTHER)
Admission: EM | Admit: 2017-04-21 | Discharge: 2017-04-21 | Disposition: A | Discharge status: Home / Self Care | Payer: Self-pay | Attending: Emergency Medicine | Admitting: Emergency Medicine

## 2017-04-21 ENCOUNTER — Encounter (HOSPITAL_BASED_OUTPATIENT_CLINIC_OR_DEPARTMENT_OTHER): Payer: Self-pay | Admitting: Emergency Medicine

## 2017-04-21 ENCOUNTER — Emergency Department (HOSPITAL_BASED_OUTPATIENT_CLINIC_OR_DEPARTMENT_OTHER): Payer: Self-pay

## 2017-04-21 DIAGNOSIS — Y939 Activity, unspecified: Secondary | ICD-10-CM | POA: Insufficient documentation

## 2017-04-21 DIAGNOSIS — Z87891 Personal history of nicotine dependence: Secondary | ICD-10-CM | POA: Insufficient documentation

## 2017-04-21 DIAGNOSIS — R0789 Other chest pain: Secondary | ICD-10-CM | POA: Insufficient documentation

## 2017-04-21 DIAGNOSIS — S43422A Sprain of left rotator cuff capsule, initial encounter: Secondary | ICD-10-CM | POA: Insufficient documentation

## 2017-04-21 DIAGNOSIS — R079 Chest pain, unspecified: Secondary | ICD-10-CM | POA: Insufficient documentation

## 2017-04-21 DIAGNOSIS — X58XXXA Exposure to other specified factors, initial encounter: Secondary | ICD-10-CM | POA: Insufficient documentation

## 2017-04-21 DIAGNOSIS — Z79899 Other long term (current) drug therapy: Secondary | ICD-10-CM | POA: Insufficient documentation

## 2017-04-21 DIAGNOSIS — L03012 Cellulitis of left finger: Secondary | ICD-10-CM | POA: Insufficient documentation

## 2017-04-21 DIAGNOSIS — I10 Essential (primary) hypertension: Secondary | ICD-10-CM | POA: Insufficient documentation

## 2017-04-21 DIAGNOSIS — E119 Type 2 diabetes mellitus without complications: Secondary | ICD-10-CM | POA: Insufficient documentation

## 2017-04-21 DIAGNOSIS — Y929 Unspecified place or not applicable: Secondary | ICD-10-CM | POA: Insufficient documentation

## 2017-04-21 DIAGNOSIS — Y998 Other external cause status: Secondary | ICD-10-CM | POA: Insufficient documentation

## 2017-04-21 LAB — CBC
HCT: 41.7 % (ref 39.0–52.0)
HEMOGLOBIN: 14.7 g/dL (ref 13.0–17.0)
MCH: 31.7 pg (ref 26.0–34.0)
MCHC: 35.3 g/dL (ref 30.0–36.0)
MCV: 89.9 fL (ref 78.0–100.0)
Platelets: 96 10*3/uL — ABNORMAL LOW (ref 150–400)
RBC: 4.64 MIL/uL (ref 4.22–5.81)
RDW: 12.3 % (ref 11.5–15.5)
WBC: 7.6 10*3/uL (ref 4.0–10.5)

## 2017-04-21 LAB — BASIC METABOLIC PANEL
Anion gap: 9 (ref 5–15)
BUN: 18 mg/dL (ref 6–20)
CO2: 26 mmol/L (ref 22–32)
Calcium: 9.6 mg/dL (ref 8.9–10.3)
Chloride: 100 mmol/L — ABNORMAL LOW (ref 101–111)
Creatinine, Ser: 1.33 mg/dL — ABNORMAL HIGH (ref 0.61–1.24)
GFR calc Af Amer: >60 mL/min (ref >60)
GLUCOSE: 166 mg/dL — AB (ref 65–99)
POTASSIUM: 4 mmol/L (ref 3.5–5.1)
Sodium: 135 mmol/L (ref 135–145)

## 2017-04-21 LAB — TROPONIN I: Troponin I: <0.03 ng/mL (ref <0.03)

## 2017-04-21 MED ORDER — IBUPROFEN 600 MG PO TABS: 600.0000 mg | ORAL_TABLET | Freq: Four times a day (QID) | ORAL | 0 refills | Status: DC | PRN

## 2017-04-21 MED ORDER — SULFAMETHOXAZOLE-TRIMETHOPRIM 800-160 MG PO TABS: 1.0000 | ORAL_TABLET | Freq: Two times a day (BID) | ORAL | 0 refills | Status: AC

## 2017-04-21 MED ORDER — BUPIVACAINE HCL (PF) 0.5 % IJ SOLN
10.0000 mL | Freq: Once | INTRAMUSCULAR | Status: AC
Start: 2017-04-21 — End: 2017-04-21
  Administered 2017-04-21: 10 mL
  Filled 2017-04-21: qty 10

## 2017-04-21 MED ORDER — SULFAMETHOXAZOLE-TRIMETHOPRIM 800-160 MG PO TABS
1.0000 | ORAL_TABLET | Freq: Once | ORAL | Status: AC
  Administered 2017-04-21: 1 via ORAL
  Filled 2017-04-21: qty 1

## 2017-04-21 NOTE — ED Notes (Signed)
ED Provider at bedside. 

## 2017-04-21 NOTE — ED Notes (Signed)
Patient transported to X-ray 

## 2017-04-21 NOTE — ED Provider Notes (Signed)
[facility] EMERGENCY DEPARTMENT Provider Note   CSN: [id number] Arrival date & time: 04/21/17  1244     History   Chief Complaint No chief complaint on file.   HPI Cody [name] is a 47 y.o. male.  Pt presents to the ED today with left shoulder and left upper chest pain.  It hurts to touch and to move.  He also has pain to his left middle finger.  Pt denies sob.  No n/v.      Past Medical History:  Diagnosis Date  . Acid reflux   . Hypertension     Patient Active Problem List   Diagnosis Date Noted  . New onset type 2 diabetes mellitus (HCC) 12/06/2015  . Essential hypertension 12/05/2015  . GERD (gastroesophageal reflux disease) 12/05/2015  . Cellulitis of leg, right 12/04/2015  . AKI (acute kidney injury) (HCC) 12/04/2015  . Thrombocytopenia (HCC) 12/04/2015    Past Surgical History:  Procedure Laterality Date  . KNEE SURGERY         Home Medications    Prior to Admission medications   Medication Sig Start Date End Date Taking? Authorizing Provider  doxycycline (VIBRAMYCIN) 100 MG capsule Take 1 capsule (100 mg total) by mouth 2 (two) times daily. 12/06/15  Yes Rama, Maryruth Bun, MD  lisinopril-hydrochlorothiazide (PRINZIDE,ZESTORETIC) 20-25 MG per tablet Take 1 tablet by mouth daily.     Yes [provider]  ranitidine (ZANTAC) 150 MG tablet Take 150 mg by mouth daily.     Yes [provider]  ibuprofen (ADVIL,MOTRIN) 600 MG tablet Take 1 tablet (600 mg total) by mouth [name] 6 (six) hours as needed. 04/21/17   Jacalyn Lefevre, MD  sulfamethoxazole-trimethoprim (BACTRIM DS,SEPTRA DS) 800-160 MG tablet Take 1 tablet by mouth 2 (two) times daily for 7 days. 04/21/17 04/28/17  Jacalyn Lefevre, MD    Family History No family history on file.  Social History Social History   Tobacco Use  . Smoking status: Former Games developer  . Smokeless tobacco: Never Used  Substance Use Topics  . Alcohol use: No  . Drug use: No     Allergies     Patient has no known allergies.   Review of Systems Review of Systems  Cardiovascular: Positive for chest pain.  Musculoskeletal:       Left shoulder  All other systems reviewed and are negative.    Physical Exam Updated Vital Signs BP (!) 144/100   Pulse 79   Resp 17   Ht 6\' 2"  (1.88 m)   Wt (!) 147 kg (324 lb)   SpO2 100%   BMI 41.60 kg/m   Physical Exam  Constitutional: He is oriented to person, place, and time. He appears well-developed and well-nourished.  HENT:  Head: Normocephalic and atraumatic.  Right Ear: External ear normal.  Left Ear: External ear normal.  Nose: Nose normal.  Mouth/Throat: Oropharynx is clear and moist.  Eyes: Conjunctivae and EOM are normal. Pupils are equal, round, and reactive to light.  Neck: Normal range of motion. Neck supple.  Cardiovascular: Normal rate, regular rhythm, normal heart sounds and intact distal pulses.  Pulmonary/Chest: Effort normal and breath sounds normal.    Abdominal: Soft. Bowel sounds are normal.  Musculoskeletal:       Left shoulder: He exhibits decreased range of motion and tenderness.  Paronychia to left middle finger  Neurological: He is alert and oriented to person, place, and time.  Skin: Skin is warm and dry. Capillary refill takes less than  2 seconds.  Psychiatric: He has a normal mood and affect. His behavior is normal. Judgment and thought content normal.  Nursing note and vitals reviewed.    ED Treatments / Results  Labs (all labs ordered are listed, but only abnormal results are displayed) Labs Reviewed  BASIC METABOLIC PANEL - Abnormal; Notable for the following components:      Result Value   Chloride 100 (*)    Glucose, Bld 166 (*)    Creatinine, Ser 1.33 (*)    All other components within normal limits  CBC - Abnormal; Notable for the following components:   Platelets 96 (*)    All other components within normal limits  TROPONIN I    EKG  EKG Interpretation  Date/Time:  Sunday  April 21 2017 13:08:35 EST Ventricular Rate:  81 PR Interval:  166 QRS Duration: 100 QT Interval:  368 QTC Calculation: 427 R Axis:   -43 Text Interpretation:  Normal sinus rhythm Left axis deviation Pulmonary disease pattern Abnormal ECG No old tracing to compare Confirmed by Jacalyn Lefevre [phone]) on 04/21/2017 4:14:06 PM       Radiology Dg Chest 2 View  Result Date: 04/21/2017 CLINICAL DATA:  Intermittent left-sided chest pain, initial encounter EXAM: CHEST  2 VIEW COMPARISON:  None. FINDINGS: The heart size and mediastinal contours are within normal limits. Both lungs are clear. The visualized skeletal structures are unremarkable. IMPRESSION: No active cardiopulmonary disease. Electronically Signed   By: Alcide Clever M.D.   On: 04/21/2017 15:51   Dg Shoulder Left  Result Date: 04/21/2017 CLINICAL DATA:  Left shoulder pain, no known injury, initial encounter EXAM: LEFT SHOULDER - 2+ VIEW COMPARISON:  None. FINDINGS: Mild degenerative changes of the glenohumeral joint are seen. No acute fracture or dislocation is noted. The underlying bony thorax is within normal limits. IMPRESSION: Mild degenerative change without acute abnormality. Electronically Signed   By: Alcide Clever M.D.   On: 04/21/2017 15:52    Procedures .Marland KitchenIncision and Drainage Date/Time: 04/21/2017 5:05 PM Performed by: Jacalyn Lefevre, MD Authorized by: Jacalyn Lefevre, MD   Consent:    Consent obtained:  Verbal   Consent given by:  Patient   Risks discussed:  Incomplete drainage, bleeding and pain   Alternatives discussed:  No treatment Location:    Type:  Abscess   Size:  1 cm   Location:  Upper extremity   Upper extremity location:  Finger   Finger location:  L long finger Pre-procedure details:    Skin preparation:  Betadine Anesthesia (see MAR for exact dosages):    Anesthesia method:  Nerve block   Block anesthetic:  Bupivacaine 0.5% w/o epi   Block technique:  Digital block   Block injection  procedure:  Anatomic landmarks identified, introduced needle, incremental injection, negative aspiration for blood and anatomic landmarks palpated   Block outcome:  Anesthesia achieved Procedure type:    Complexity:  Simple Procedure details:    Incision types:  Single straight   Incision depth:  Dermal   Scalpel blade:  11   Wound management:  Probed and deloculated   Drainage:  Purulent   Drainage amount:  Moderate   Wound treatment:  Wound left open   (including critical care time)  Medications Ordered in ED Medications  sulfamethoxazole-trimethoprim (BACTRIM DS,SEPTRA DS) 800-160 MG per tablet 1 tablet (not administered)  bupivacaine (MARCAINE) 0.5 % injection 10 mL (10 mLs Infiltration Given by Other 04/21/17 1546)     Initial Impression / Assessment and Plan /  ED Course  I have reviewed the triage vital signs and the nursing notes.  Pertinent labs & imaging results that were available during my care of the patient were reviewed by me and considered in my medical decision making (see chart for details).     CP is not typical and EKG/Troponin ok.  Pain is likely from his shoulder.  Pt also told his blood sugar was slightly high and to f/u with pcp.  Return if worse.  Final Clinical Impressions(s) / ED Diagnoses   Final diagnoses:  Atypical chest pain  Paronychia of left middle finger  Sprain of left rotator cuff capsule, initial encounter    ED Discharge Orders        Ordered    sulfamethoxazole-trimethoprim (BACTRIM DS,SEPTRA DS) 800-160 MG tablet  2 times daily     04/21/17 1704    ibuprofen (ADVIL,MOTRIN) 600 MG tablet  [name] 6 hours PRN     04/21/17 1704       Jacalyn LefevreHaviland, Charlie Char, MD 04/21/17 1707

## 2017-04-21 NOTE — ED Triage Notes (Signed)
Pt c/o intermiittent left sided chest pain for days and swelling to left middle finger x 2-3 days. Pt denies injury to finger. Chest pain does not radiate. Pt denies cough, N/V or shortness of breath.

## 2017-04-21 NOTE — Discharge Instructions (Signed)
Your blood sugar was slightly high today (166).  You will need to get a repeat blood sugar by your doctor in about a month.

## 2017-09-04 ENCOUNTER — Encounter (HOSPITAL_BASED_OUTPATIENT_CLINIC_OR_DEPARTMENT_OTHER): Payer: Self-pay | Admitting: *Deleted

## 2017-09-04 ENCOUNTER — Other Ambulatory Visit: Payer: Self-pay

## 2017-09-04 ENCOUNTER — Emergency Department (HOSPITAL_BASED_OUTPATIENT_CLINIC_OR_DEPARTMENT_OTHER)
Admission: EM | Admit: 2017-09-04 | Discharge: 2017-09-04 | Disposition: A | Discharge status: Home / Self Care | Payer: PRIVATE HEALTH INSURANCE | Attending: Emergency Medicine | Admitting: Emergency Medicine

## 2017-09-04 DIAGNOSIS — Z79899 Other long term (current) drug therapy: Secondary | ICD-10-CM | POA: Diagnosis not present

## 2017-09-04 DIAGNOSIS — Z87891 Personal history of nicotine dependence: Secondary | ICD-10-CM | POA: Diagnosis not present

## 2017-09-04 DIAGNOSIS — I10 Essential (primary) hypertension: Secondary | ICD-10-CM | POA: Diagnosis not present

## 2017-09-04 DIAGNOSIS — J069 Acute upper respiratory infection, unspecified: Secondary | ICD-10-CM | POA: Diagnosis not present

## 2017-09-04 DIAGNOSIS — R05 Cough: Secondary | ICD-10-CM | POA: Diagnosis present

## 2017-09-04 DIAGNOSIS — B9789 Other viral agents as the cause of diseases classified elsewhere: Secondary | ICD-10-CM

## 2017-09-04 MED ORDER — NAPROXEN 500 MG PO TABS: 500.0000 mg | ORAL_TABLET | Freq: Two times a day (BID) | ORAL | 0 refills | Status: DC

## 2017-09-04 MED ORDER — BENZONATATE 100 MG PO CAPS: 200.0000 mg | ORAL_CAPSULE | Freq: Three times a day (TID) | ORAL | 0 refills | Status: DC

## 2017-09-04 MED ORDER — BENZONATATE 100 MG PO CAPS
200.0000 mg | ORAL_CAPSULE | Freq: Once | ORAL | Status: AC
  Administered 2017-09-04: 200 mg via ORAL
  Filled 2017-09-04: qty 2

## 2017-09-04 MED ORDER — KETOROLAC TROMETHAMINE 30 MG/ML IJ SOLN
30.0000 mg | Freq: Once | INTRAMUSCULAR | Status: AC
  Administered 2017-09-04: 30 mg via INTRAMUSCULAR
  Filled 2017-09-04: qty 1

## 2017-09-04 MED ORDER — FLUTICASONE PROPIONATE 50 MCG/ACT NA SUSP: 1.0000 | Freq: Every day | NASAL | 2 refills | Status: DC

## 2017-09-04 NOTE — ED Provider Notes (Signed)
[facility] EMERGENCY DEPARTMENT Provider Note   CSN: [id number] Arrival date & time: 09/04/17  1357     History   Chief Complaint Chief Complaint  Patient presents with  . URI    HPI Cody [name] is a 47 y.o. male with past medical history of hypertension, GERD, who presents to ED for evaluation of 6-day history of chest congestion, sinus pressure, generalized fatigue.  He also reports cough productive with clear mucus.  He was seen and evaluated at [facility] yesterday with unremarkable chest x-ray and EKG.  He was sent home with diagnosis of bronchitis and albuterol inhaler.  He has been using the inhaler with no improvement in his symptoms.  He has not taken any medications to help with his symptoms otherwise.  No sick contacts with similar symptoms.  He states that the symptoms began after he was walking at night in the rain.  States that his clothes were soaked.  He denies any vomiting, abdominal pain, chest pain, hemoptysis, shortness of breath, wheezing, fevers, tick bites, headache, vision changes.  HPI  Past Medical History:  Diagnosis Date  . Acid reflux   . Hypertension     Patient Active Problem List   Diagnosis Date Noted  . New onset type 2 diabetes mellitus (HCC) 12/06/2015  . Essential hypertension 12/05/2015  . GERD (gastroesophageal reflux disease) 12/05/2015  . Cellulitis of leg, right 12/04/2015  . AKI (acute kidney injury) (HCC) 12/04/2015  . Thrombocytopenia (HCC) 12/04/2015    Past Surgical History:  Procedure Laterality Date  . KNEE SURGERY          Home Medications    Prior to Admission medications   Medication Sig Start Date End Date Taking? Authorizing Provider  benzonatate (TESSALON) 100 MG capsule Take 2 capsules (200 mg total) by mouth every 8 (eight) hours. 09/04/17   Afton Lavalle, PA-C  doxycycline (VIBRAMYCIN) 100 MG capsule Take 1 capsule (100 mg total) by mouth 2 (two) times daily. 12/06/15   Rama, Maryruth Bunhristina P, MD    fluticasone (FLONASE) 50 MCG/ACT nasal spray Place 1 spray into both nostrils daily. 09/04/17   Cambell Stanek, PA-C  ibuprofen (ADVIL,MOTRIN) 600 MG tablet Take 1 tablet (600 mg total) by mouth every 6 (six) hours as needed. 04/21/17   Jacalyn LefevreHaviland, Julie, MD  lisinopril-hydrochlorothiazide (PRINZIDE,ZESTORETIC) 20-25 MG per tablet Take 1 tablet by mouth daily.      [provider]  naproxen (NAPROSYN) 500 MG tablet Take 1 tablet (500 mg total) by mouth 2 (two) times daily. 09/04/17   Elizbeth Posa, PA-C  ranitidine (ZANTAC) 150 MG tablet Take 150 mg by mouth daily.      [provider]    Family History History reviewed. No pertinent family history.  Social History Social History   Tobacco Use  . Smoking status: Former Games developermoker  . Smokeless tobacco: Never Used  Substance Use Topics  . Alcohol use: No  . Drug use: No     Allergies   Patient has no known allergies.   Review of Systems Review of Systems  Constitutional: Negative for chills and fever.  HENT: Positive for congestion, sinus pressure and sore throat. Negative for ear discharge, facial swelling, hearing loss, trouble swallowing and voice change.   Respiratory: Positive for cough. Negative for shortness of breath.   Cardiovascular: Negative for chest pain.  Gastrointestinal: Negative for vomiting.  Neurological: Negative for headaches.     Physical Exam Updated Vital Signs BP (!) 177/114   Pulse  76   Temp 98 F (36.7 C) (Oral)   Resp 20   Ht 6\' 2"  (1.88 m)   Wt (!) 147 kg (324 lb)   SpO2 100%   BMI 41.60 kg/m   Physical Exam  Constitutional: He appears well-developed and well-nourished. No distress.  Nontoxic-appearing and in no acute distress.  Peaking in complete sentences without difficulty.  HENT:  Head: Normocephalic and atraumatic.  Right Ear: Tympanic membrane normal.  Left Ear: Tympanic membrane normal.  Nose: Right sinus exhibits maxillary sinus tenderness. Left sinus exhibits  maxillary sinus tenderness.  Mouth/Throat: Uvula is midline.  Patient does not appear to be in acute distress. No trismus or drooling present. No pooling of secretions. Patient is tolerating secretions and is not in respiratory distress. No neck pain or tenderness to palpation of the neck. Full active and passive range of motion of the neck. No evidence of RPA or PTA.  Eyes: Conjunctivae and EOM are normal. Right eye exhibits no discharge. Left eye exhibits no discharge. No scleral icterus.  Neck: Normal range of motion. Neck supple.  Cardiovascular: Normal rate, regular rhythm, normal heart sounds and intact distal pulses. Exam reveals no gallop and no friction rub.  No murmur heard. Pulmonary/Chest: Effort normal and breath sounds normal. No respiratory distress.  Abdominal: Soft. Bowel sounds are normal. He exhibits no distension. There is no tenderness. There is no guarding.  Musculoskeletal: Normal range of motion. He exhibits no edema.  Neurological: He is alert. He exhibits normal muscle tone. Coordination normal.  Skin: Skin is warm and dry. No rash noted. He is not diaphoretic.  Psychiatric: He has a normal mood and affect.  Nursing note and vitals reviewed.    ED Treatments / Results  Labs (all labs ordered are listed, but only abnormal results are displayed) Labs Reviewed - No data to display  EKG None  Radiology No results found.  Procedures Procedures (including critical care time)  Medications Ordered in ED Medications  ketorolac (TORADOL) 30 MG/ML injection 30 mg (has no administration in time range)  benzonatate (TESSALON) capsule 200 mg (has no administration in time range)     Initial Impression / Assessment and Plan / ED Course  I have reviewed the triage vital signs and the nursing notes.  Pertinent labs & imaging results that were available during my care of the patient were reviewed by me and considered in my medical decision making (see chart for  details).     Aeration of 6-day history of cough productive with clear mucus, chest and nasal congestion, generalized fatigue.  He was seen and evaluated at the ED yesterday and was discharged home with diagnosis of bronchitis and albuterol inhaler.  He has not used any other medications to help with his symptoms.  Physical exam he is overall well-appearing.  Lungs are clear to auscultation bilaterally.  He is afebrile.  He denies any chest pain, shortness of breath, hemoptysis.  I reviewed the note from yesterday's visit which showed EKG with no changes from prior tracings, chest x-ray unremarkable.  He is speaking complete sentences without difficulty.  No wheezing noted on examination.  Suspect that his symptoms are viral in nature considering that they started after he was walking outside in the rain.  Will discharge home with supportive measures including antitussives, anti-inflammatories and nasal spray.  He states that he has been taking his blood pressure medication as prescribed. Patient's blood pressure initially elevated in the emergency department today. Patient denies headache, change in  vision, numbness, weakness, chest pain, dyspnea, dizziness, or lightheadedness therefore doubt hypertensive emergency. Discussed elevated blood pressure with the patient and the need for primary care follow up with potential need to initiate or change antihypertensive medications and or for further evaluation. Discussed return precaution signs/symptoms for hypertensive emergency as listed above with the patient.  Advised to return to ED for any severe worsening symptoms.  Portions of this note were generated with Scientist, clinical (histocompatibility and immunogenetics). Dictation errors may occur despite best attempts at proofreading.   Final Clinical Impressions(s) / ED Diagnoses   Final diagnoses:  Viral URI with cough    ED Discharge Orders        Ordered    benzonatate (TESSALON) 100 MG capsule  Every 8 hours     09/04/17 1422      naproxen (NAPROSYN) 500 MG tablet  2 times daily     09/04/17 1422    fluticasone (FLONASE) 50 MCG/ACT nasal spray  Daily     09/04/17 1422       Dietrich Pates, PA-C 09/04/17 1426    Raeford Razor, MD 09/08/17 [phone]

## 2017-09-04 NOTE — Discharge Instructions (Signed)
Please follow-up with your primary care provider for further management of your high blood pressure.

## 2017-09-04 NOTE — ED Triage Notes (Addendum)
Pt c/o chest congestion and generalized fatigue x 1 week, seen by [facility] ED yesterday for same  Chest xray , ekg done given and inhaler reports no relief

## 2017-09-04 NOTE — ED Notes (Signed)
ED Provider at bedside. 

## 2018-08-26 ENCOUNTER — Other Ambulatory Visit (HOSPITAL_COMMUNITY): Payer: Self-pay | Admitting: Orthopedic Surgery

## 2018-08-30 ENCOUNTER — Emergency Department (HOSPITAL_BASED_OUTPATIENT_CLINIC_OR_DEPARTMENT_OTHER): Payer: PRIVATE HEALTH INSURANCE

## 2018-08-30 ENCOUNTER — Encounter (HOSPITAL_BASED_OUTPATIENT_CLINIC_OR_DEPARTMENT_OTHER): Payer: Self-pay | Admitting: *Deleted

## 2018-08-30 ENCOUNTER — Emergency Department (HOSPITAL_BASED_OUTPATIENT_CLINIC_OR_DEPARTMENT_OTHER)
Admission: EM | Admit: 2018-08-30 | Discharge: 2018-08-30 | Disposition: A | Discharge status: Home / Self Care | Payer: PRIVATE HEALTH INSURANCE | Attending: Emergency Medicine | Admitting: Emergency Medicine

## 2018-08-30 ENCOUNTER — Other Ambulatory Visit: Payer: Self-pay

## 2018-08-30 DIAGNOSIS — R51 Headache: Secondary | ICD-10-CM | POA: Insufficient documentation

## 2018-08-30 DIAGNOSIS — Z79899 Other long term (current) drug therapy: Secondary | ICD-10-CM | POA: Diagnosis not present

## 2018-08-30 DIAGNOSIS — E119 Type 2 diabetes mellitus without complications: Secondary | ICD-10-CM | POA: Insufficient documentation

## 2018-08-30 DIAGNOSIS — R6883 Chills (without fever): Secondary | ICD-10-CM | POA: Insufficient documentation

## 2018-08-30 DIAGNOSIS — I10 Essential (primary) hypertension: Secondary | ICD-10-CM | POA: Diagnosis not present

## 2018-08-30 DIAGNOSIS — R531 Weakness: Secondary | ICD-10-CM | POA: Diagnosis not present

## 2018-08-30 DIAGNOSIS — R42 Dizziness and giddiness: Secondary | ICD-10-CM | POA: Insufficient documentation

## 2018-08-30 DIAGNOSIS — R112 Nausea with vomiting, unspecified: Secondary | ICD-10-CM | POA: Insufficient documentation

## 2018-08-30 DIAGNOSIS — Z87891 Personal history of nicotine dependence: Secondary | ICD-10-CM | POA: Insufficient documentation

## 2018-08-30 LAB — COMPREHENSIVE METABOLIC PANEL
ALT: 38 U/L (ref 0–44)
AST: 26 U/L (ref 15–41)
Albumin: 4.6 g/dL (ref 3.5–5.0)
Alkaline Phosphatase: 42 U/L (ref 38–126)
Anion gap: 7 (ref 5–15)
BUN: 18 mg/dL (ref 6–20)
CO2: 23 mmol/L (ref 22–32)
Calcium: 9.1 mg/dL (ref 8.9–10.3)
Chloride: 107 mmol/L (ref 98–111)
Creatinine, Ser: 1.2 mg/dL (ref 0.61–1.24)
GFR calc Af Amer: >60 mL/min (ref >60)
GFR calc non Af Amer: >60 mL/min (ref >60)
Glucose, Bld: 155 mg/dL — ABNORMAL HIGH (ref 70–99)
Potassium: 3.9 mmol/L (ref 3.5–5.1)
Sodium: 137 mmol/L (ref 135–145)
Total Bilirubin: 0.7 mg/dL (ref 0.3–1.2)
Total Protein: 7 g/dL (ref 6.5–8.1)

## 2018-08-30 LAB — CBC WITH DIFFERENTIAL/PLATELET
Abs Immature Granulocytes: 0.02 10*3/uL (ref 0.00–0.07)
Basophils Absolute: 0 10*3/uL (ref 0.0–0.1)
Basophils Relative: 0 %
Eosinophils Absolute: 0.1 10*3/uL (ref 0.0–0.5)
Eosinophils Relative: 2 %
HCT: 39.9 % (ref 39.0–52.0)
Hemoglobin: 13.4 g/dL (ref 13.0–17.0)
Immature Granulocytes: 0 %
Lymphocytes Relative: 22 %
Lymphs Abs: 1.9 10*3/uL (ref 0.7–4.0)
MCH: 32.1 pg (ref 26.0–34.0)
MCHC: 33.6 g/dL (ref 30.0–36.0)
MCV: 95.5 fL (ref 80.0–100.0)
Monocytes Absolute: 0.6 10*3/uL (ref 0.1–1.0)
Monocytes Relative: 7 %
Neutro Abs: 6.1 10*3/uL (ref 1.7–7.7)
Neutrophils Relative %: 69 %
Platelets: 87 10*3/uL — ABNORMAL LOW (ref 150–400)
RBC: 4.18 MIL/uL — ABNORMAL LOW (ref 4.22–5.81)
RDW: 12.4 % (ref 11.5–15.5)
WBC: 8.8 10*3/uL (ref 4.0–10.5)
nRBC: 0 % (ref 0.0–0.2)

## 2018-08-30 LAB — URINALYSIS, ROUTINE W REFLEX MICROSCOPIC
Bilirubin Urine: NEGATIVE
Glucose, UA: NEGATIVE mg/dL
Hgb urine dipstick: NEGATIVE
Ketones, ur: NEGATIVE mg/dL
Leukocytes,Ua: NEGATIVE
Nitrite: NEGATIVE
Protein, ur: NEGATIVE mg/dL
Specific Gravity, Urine: 1.025 (ref 1.005–1.030)
pH: 6 (ref 5.0–8.0)

## 2018-08-30 LAB — CBG MONITORING, ED: Glucose-Capillary: 140 mg/dL — ABNORMAL HIGH (ref 70–99)

## 2018-08-30 LAB — LIPASE, BLOOD: Lipase: 48 U/L (ref 11–51)

## 2018-08-30 MED ORDER — LISINOPRIL 10 MG PO TABS
40.0000 mg | ORAL_TABLET | Freq: Once | ORAL | Status: AC
  Administered 2018-08-30: 40 mg via ORAL
  Filled 2018-08-30: qty 4

## 2018-08-30 MED ORDER — ONDANSETRON HCL 4 MG/2ML IJ SOLN
4.0000 mg | Freq: Once | INTRAMUSCULAR | Status: AC
  Administered 2018-08-30: 4 mg via INTRAVENOUS
  Filled 2018-08-30: qty 2

## 2018-08-30 MED ORDER — ONDANSETRON 4 MG PO TBDP: 4.0000 mg | ORAL_TABLET | Freq: Three times a day (TID) | ORAL | 0 refills | Status: DC | PRN

## 2018-08-30 MED ORDER — AMLODIPINE BESYLATE 5 MG PO TABS
10.0000 mg | ORAL_TABLET | Freq: Once | ORAL | Status: AC
  Administered 2018-08-30: 10 mg via ORAL
  Filled 2018-08-30: qty 2

## 2018-08-30 MED ORDER — CHLORTHALIDONE 25 MG PO TABS
25.0000 mg | ORAL_TABLET | Freq: Every day | ORAL | Status: DC
  Filled 2018-08-30: qty 1

## 2018-08-30 MED ORDER — SODIUM CHLORIDE 0.9 % IV BOLUS
1000.0000 mL | Freq: Once | INTRAVENOUS | Status: AC
  Administered 2018-08-30: 1000 mL via INTRAVENOUS

## 2018-08-30 NOTE — ED Notes (Signed)
Pt states he feels like he has a "migraine", denies hx of migraines.

## 2018-08-30 NOTE — ED Notes (Signed)
EDP Kaitlyn PA notified of pts hypertension with last vital check. Pt states he did not take bp medication this am because he didnt feel well. Pt given water for PO challenge per PA request

## 2018-08-30 NOTE — ED Notes (Signed)
Patient transported to X-ray 

## 2018-08-30 NOTE — ED Notes (Signed)
ED Provider at bedside. 

## 2018-08-30 NOTE — ED Notes (Signed)
Pt given hot chocolate, states he feels chilled. Pt afebrile

## 2018-08-30 NOTE — ED Notes (Addendum)
Pt denies fever, diaphoresis, denies chest pain. Pt verbalizes decreased appetite r/t nausea and vomiting.

## 2018-08-30 NOTE — Discharge Instructions (Signed)
Prescription has been sent to your pharmacy for Zofran.  Please take as needed for nausea.   There are many causes of abdominal pain. Most pain is not serious and goes away, but some pain gets worse, changes, or will not go away. Please return to the emergency department or see your doctor right away if you (or your family member) experience any of the following:   Pain that gets worse or moves to just one spot.  Pain that gets worse if you cough or sneeze.  Pain with going over a bump in the road.  Pain that does not get better in 24 hours.  Inability to keep down liquids (vomiting)--especially if you are making less urine.  Fainting.  Blood in the vomit or stool.  High fever or shaking chills.  Swelling of the abdomen.  Any new or worsening problem.   Follow-up Instructions  Follow-up with primary care doctor in 2 to 5 days for further evaluation.  Please call Monday morning to schedule follow-up appointment. Medications  Take the following medications:    Additional Instructions  No alcohol.  No caffeine, aspirin, or cigarettes.

## 2018-08-30 NOTE — ED Triage Notes (Signed)
Pt reports "feeling weak" and dizzy yesterday, with n/v and SOB. Sweating also. Sx started around 2-3pm. Denies chest pain. States he has vomited several times since then and states he still "feels weak" and SOB

## 2018-08-30 NOTE — ED Provider Notes (Signed)
[facility] Provider Note   CSN: [id number] Arrival date & time: 08/30/18  1446    History   Chief Complaint Chief Complaint  Patient presents with  . Emesis    HPI Cody [name] is a 48 y.o. male with history of acid reflux and hypertension presents emergency department today with chief complaint of weakness x2 days.  Patient states yesterday while sitting on the couch watching TV he started to feel weak and dizzy.  He states he fell like the room was spinning.  The dizziness lasted approximately 2 minutes. He also reports associated nausea with emesis.  He estimates 8 episodes of nonbloody nonbilious emesis in the last 24 hours.  Also reports he is feeling short of breath after coughing. His cough is non productive. He admits to decreased PO intake over last 24 hours.  He did not take any medications for his symptoms prior to arrival. He did not take his morning dose of hypertension medications because he was feeling so weak.  Denies fever, chills,chest pain, abdominal pain, diarrhea, urinary symptoms, suspicious food consumption.Marland Kitchen  History provided by patient with additional history obtained from chart review.    Past Medical History:  Diagnosis Date  . Acid reflux   . Hypertension     Patient Active Problem List   Diagnosis Date Noted  . New onset type 2 diabetes mellitus ([place]) 12/06/2015  . Essential hypertension 12/05/2015  . GERD (gastroesophageal reflux disease) 12/05/2015  . Cellulitis of leg, right 12/04/2015  . AKI (acute kidney injury) ([place]) 12/04/2015  . Thrombocytopenia ([place]) 12/04/2015    Past Surgical History:  Procedure Laterality Date  . KNEE SURGERY          Home Medications    Prior to Admission medications   Medication Sig Start Date End Date Taking? Authorizing Provider  amLODipine (NORVASC) 10 MG tablet amlodipine 10 mg tablet  TAKE ONE TABLET BY MOUTH ONE TIME DAILY 04/04/18  Yes [provider]   atorvastatin (LIPITOR) 40 MG tablet atorvastatin 40 mg tablet  TAKE ONE TABLET BY MOUTH ONE TIME DAILY 05/30/18  Yes [provider]  chlorthalidone (HYGROTON) 25 MG tablet chlorthalidone 25 mg tablet  TAKE ONE TABLET BY MOUTH ONE TIME DAILY 04/04/18  Yes [provider]  lisinopril (ZESTRIL) 40 MG tablet lisinopril 40 mg tablet  TAKE ONE TABLET BY MOUTH ONE TIME DAILY 04/04/18  Yes [provider]  pantoprazole (PROTONIX) 40 MG tablet pantoprazole 40 mg tablet,delayed release  TAKE ONE TABLET BY MOUTH TWICE A DAY BEFORE MEALS 04/04/18  Yes [provider]  benzonatate (TESSALON) 100 MG capsule Take 2 capsules (200 mg total) by mouth every 8 (eight) hours. 09/04/17   Khatri, Hina, PA-C  cetirizine (ZYRTEC) 10 MG tablet Take 10 mg by mouth daily. 04/10/18   [provider]  doxycycline (VIBRAMYCIN) 100 MG capsule Take 1 capsule (100 mg total) by mouth 2 (two) times daily. 12/06/15   Rama, Venetia Maxon, MD  fluticasone (FLONASE) 50 MCG/ACT nasal spray Place 1 spray into both nostrils daily. 09/04/17   Khatri, Hina, PA-C  hydrochlorothiazide (HYDRODIURIL) 25 MG tablet hydrochlorothiazide 25 mg tablet  TAKE ONE TABLET BY MOUTH ONE TIME DAILY    [provider]  ibuprofen (ADVIL,MOTRIN) 600 MG tablet Take 1 tablet (600 mg total) by mouth every 6 (six) hours as needed. 04/21/17   Isla Pence, MD  lisinopril-hydrochlorothiazide (PRINZIDE,ZESTORETIC) 20-25 MG per tablet Take 1 tablet by mouth daily.  [provider]  naproxen (NAPROSYN) 500 MG tablet Take 1 tablet (500 mg total) by mouth 2 (two) times daily. 09/04/17   Khatri, Hina, PA-C  naproxen (NAPROSYN) 500 MG tablet naproxen 500 mg tablet  TAKE ONE TABLET BY MOUTH TWICE A DAY    [provider]  ondansetron (ZOFRAN ODT) 4 MG disintegrating tablet Take 1 tablet (4 mg total) by mouth every 8 (eight) hours as needed for nausea or vomiting. 08/30/18   Mikela Senn E, PA-C   ranitidine (ZANTAC) 150 MG tablet Take 150 mg by mouth daily.      [provider]  traMADol (ULTRAM) 50 MG tablet tramadol 50 mg tablet  TAKE ONE TABLET BY MOUTH EVERY 6 HOURS    [provider]    Family History No family history on file.  Social History Social History   Tobacco Use  . Smoking status: Former Games developermoker  . Smokeless tobacco: Never Used  Substance Use Topics  . Alcohol use: No  . Drug use: No     Allergies   Patient has no known allergies.   Review of Systems Review of Systems  Constitutional: Positive for diaphoresis. Negative for chills and fever.  HENT: Negative for congestion, rhinorrhea, sinus pressure and sore throat.   Eyes: Negative for pain and redness.  Respiratory: Positive for shortness of breath. Negative for cough and wheezing.   Cardiovascular: Negative for chest pain and palpitations.  Gastrointestinal: Positive for abdominal pain, nausea and vomiting. Negative for constipation and diarrhea.  Genitourinary: Negative for dysuria.  Musculoskeletal: Negative for arthralgias, back pain, myalgias and neck pain.  Skin: Negative for rash and wound.  Neurological: Positive for dizziness and headaches. Negative for syncope, weakness and numbness.  Psychiatric/Behavioral: Negative for confusion.     Physical Exam Updated Vital Signs BP 131/81 (BP Location: Right Arm)   Pulse 81   Temp 98.4 F (36.9 C)   Resp 18   Ht 6\' 2"  (1.88 m)   Wt (!) 145.2 kg   SpO2 100%   BMI 41.09 kg/m   Physical Exam Vitals signs and nursing note reviewed.  Constitutional:      General: He is not in acute distress.    Appearance: He is not ill-appearing.  HENT:     Head: Normocephalic and atraumatic.     Right Ear: Tympanic membrane and external ear normal.     Left Ear: Tympanic membrane and external ear normal.     Nose: Nose normal.     Mouth/Throat:     Mouth: Mucous membranes are dry.     Pharynx: Oropharynx is clear.  Eyes:      General: No scleral icterus.       Right eye: No discharge.        Left eye: No discharge.     Extraocular Movements: Extraocular movements intact.     Conjunctiva/sclera: Conjunctivae normal.     Pupils: Pupils are equal, round, and reactive to light.  Neck:     Musculoskeletal: Normal range of motion.     Vascular: No JVD.  Cardiovascular:     Rate and Rhythm: Normal rate and regular rhythm.     Pulses: Normal pulses.          Radial pulses are 2+ on the right side and 2+ on the left side.     Heart sounds: Normal heart sounds.  Pulmonary:     Comments: Lungs clear to auscultation in all fields. Symmetric chest rise. No wheezing, rales,  or rhonchi. Abdominal:     Comments: Abdomen is soft, non-distended, and non-tender in all quadrants. No rigidity, no guarding. No peritoneal signs.  Musculoskeletal: Normal range of motion.  Skin:    General: Skin is warm and dry.     Capillary Refill: Capillary refill takes less than 2 seconds.  Neurological:     Mental Status: He is oriented to person, place, and time.     GCS: GCS eye subscore is 4. GCS verbal subscore is 5. GCS motor subscore is 6.     Comments: Fluent speech, no facial droop.  Psychiatric:        Behavior: Behavior normal.      ED Treatments / Results  Labs (all labs ordered are listed, but only abnormal results are displayed) Labs Reviewed  COMPREHENSIVE METABOLIC PANEL - Abnormal; Notable for the following components:      Result Value   Glucose, Bld 155 (*)    All other components within normal limits  CBC WITH DIFFERENTIAL/PLATELET - Abnormal; Notable for the following components:   RBC 4.18 (*)    Platelets 87 (*)    All other components within normal limits  CBG MONITORING, ED - Abnormal; Notable for the following components:   Glucose-Capillary 140 (*)    All other components within normal limits  URINALYSIS, ROUTINE W REFLEX MICROSCOPIC  LIPASE, BLOOD    EKG None  Radiology Dg Chest 2 View   Result Date: 08/30/2018 CLINICAL DATA:  Shortness of breath with dizziness EXAM: CHEST - 2 VIEW COMPARISON:  September 03, 2017 FINDINGS: There is no appreciable edema or consolidation. Heart is upper normal in size with pulmonary vascularity normal. No adenopathy. No bone lesions. IMPRESSION: No edema or consolidation.  Heart upper normal in size. Electronically Signed   By: Bretta BangWilliam  Woodruff III M.D.   On: 08/30/2018 16:02    Procedures Procedures (including critical care time)  Medications Ordered in ED Medications  chlorthalidone (HYGROTON) tablet 25 mg (25 mg Oral Not Given 08/30/18 1755)  sodium chloride 0.9 % bolus 1,000 mL (0 mLs Intravenous Stopped 08/30/18 1652)  ondansetron (ZOFRAN) injection 4 mg (4 mg Intravenous Given 08/30/18 1546)  amLODipine (NORVASC) tablet 10 mg (10 mg Oral Given 08/30/18 1754)  lisinopril (ZESTRIL) tablet 40 mg (40 mg Oral Given 08/30/18 1754)     Initial Impression / Assessment and Plan / ED Course  I have reviewed the triage vital signs and the nursing notes.  Pertinent labs & imaging results that were available during my care of the patient were reviewed by me and considered in my medical decision making (see chart for details).  Pt is nontoxic-appearing.  He is afebrile. Lung sounds clear throughout and abdomen nontender on exam.  Mucous membranes are dry. IVF given. EKG viewed by me shows normal sinus rhythm, no ischemic changes.Chest xray viewed by me is without infiltrate, doubt acute infectious process. CBC and CMP unremarkable. Lipase within normal limits. UA without signs of infection.   Pt reports nausea has resolved after zofran. He is tolerating PO intake and has not had any episodes of emesis while in the ED. Serial abdominal exams are benign.  His blood pressure is elevated to 153/106, this is likely caused by missed a.m. medications.  Patient given home dose of lisinopril and amlodipine.  We do not have chlorthalidone, pt will take when he gets home.   Patient is hemodynamically stable, in NAD at discharge. Evaluation does not show pathology that would require ongoing emergent intervention or  inpatient treatment. I explained the diagnosis to the patient.   Patient is comfortable with above plan and is stable for discharge at this time. All questions were answered prior to disposition. Strict return precautions for returning to the ED were discussed. Encouraged follow up with PCP.  This note was prepared using Dragon voice recognition software and may include unintentional dictation errors due to the inherent limitations of voice recognition software.    Final Clinical Impressions(s) / ED Diagnoses   Final diagnoses:  Nausea and vomiting, intractability of vomiting not specified, unspecified vomiting type    ED Discharge Orders         Ordered    ondansetron (ZOFRAN ODT) 4 MG disintegrating tablet  Every 8 hours PRN     08/30/18 1805           Sherene Sireslbrizze, Ivett Luebbe E, PA-C 08/30/18 1856    Loren RacerYelverton, David, MD 08/30/18 2146

## 2018-08-30 NOTE — ED Notes (Signed)
Pt tolerated fluids with no complaints

## 2018-08-30 NOTE — ED Notes (Signed)
Pt aware we need urine specimen, urinal provided 

## 2018-08-30 NOTE — ED Notes (Signed)
cbg 140 

## 2018-09-18 ENCOUNTER — Other Ambulatory Visit: Payer: Self-pay

## 2018-09-18 ENCOUNTER — Encounter (HOSPITAL_BASED_OUTPATIENT_CLINIC_OR_DEPARTMENT_OTHER): Payer: Self-pay | Admitting: *Deleted

## 2018-09-22 ENCOUNTER — Other Ambulatory Visit (HOSPITAL_COMMUNITY)
Admission: RE | Admit: 2018-09-22 | Discharge: 2018-09-22 | Disposition: A | Discharge status: Home / Self Care | Payer: PRIVATE HEALTH INSURANCE | Source: Ambulatory Visit | Attending: Orthopedic Surgery | Admitting: Orthopedic Surgery

## 2018-09-22 DIAGNOSIS — Z1159 Encounter for screening for other viral diseases: Secondary | ICD-10-CM | POA: Insufficient documentation

## 2018-09-22 DIAGNOSIS — Z01812 Encounter for preprocedural laboratory examination: Secondary | ICD-10-CM | POA: Insufficient documentation

## 2018-09-22 LAB — SARS CORONAVIRUS 2 (TAT 6-24 HRS): SARS Coronavirus 2: NEGATIVE

## 2018-09-22 NOTE — Progress Notes (Signed)
Ensure pre surgery drink given with instructions to complete by 0645 dos, pt verbalized understanding. 

## 2018-09-25 ENCOUNTER — Ambulatory Visit (HOSPITAL_BASED_OUTPATIENT_CLINIC_OR_DEPARTMENT_OTHER): Payer: No Typology Code available for payment source | Admitting: Certified Registered"

## 2018-09-25 ENCOUNTER — Ambulatory Visit (HOSPITAL_BASED_OUTPATIENT_CLINIC_OR_DEPARTMENT_OTHER)
Admission: RE | Admit: 2018-09-25 | Discharge: 2018-09-25 | Disposition: A | Discharge status: Home / Self Care | Payer: No Typology Code available for payment source | Attending: Orthopedic Surgery | Admitting: Orthopedic Surgery

## 2018-09-25 ENCOUNTER — Encounter (HOSPITAL_BASED_OUTPATIENT_CLINIC_OR_DEPARTMENT_OTHER)
Admission: RE | Disposition: A | Discharge status: Home / Self Care | Payer: Self-pay | Source: Home / Self Care | Attending: Orthopedic Surgery

## 2018-09-25 ENCOUNTER — Encounter (HOSPITAL_BASED_OUTPATIENT_CLINIC_OR_DEPARTMENT_OTHER): Payer: Self-pay | Admitting: *Deleted

## 2018-09-25 DIAGNOSIS — G4730 Sleep apnea, unspecified: Secondary | ICD-10-CM | POA: Insufficient documentation

## 2018-09-25 DIAGNOSIS — Z791 Long term (current) use of non-steroidal anti-inflammatories (NSAID): Secondary | ICD-10-CM | POA: Diagnosis not present

## 2018-09-25 DIAGNOSIS — M65872 Other synovitis and tenosynovitis, left ankle and foot: Secondary | ICD-10-CM | POA: Diagnosis present

## 2018-09-25 DIAGNOSIS — E669 Obesity, unspecified: Secondary | ICD-10-CM | POA: Diagnosis not present

## 2018-09-25 DIAGNOSIS — E119 Type 2 diabetes mellitus without complications: Secondary | ICD-10-CM | POA: Insufficient documentation

## 2018-09-25 DIAGNOSIS — Z87891 Personal history of nicotine dependence: Secondary | ICD-10-CM | POA: Diagnosis not present

## 2018-09-25 DIAGNOSIS — Z6841 Body Mass Index (BMI) 40.0 and over, adult: Secondary | ICD-10-CM | POA: Insufficient documentation

## 2018-09-25 DIAGNOSIS — Z79899 Other long term (current) drug therapy: Secondary | ICD-10-CM | POA: Insufficient documentation

## 2018-09-25 DIAGNOSIS — I10 Essential (primary) hypertension: Secondary | ICD-10-CM | POA: Insufficient documentation

## 2018-09-25 DIAGNOSIS — E782 Mixed hyperlipidemia: Secondary | ICD-10-CM | POA: Insufficient documentation

## 2018-09-25 DIAGNOSIS — M19072 Primary osteoarthritis, left ankle and foot: Secondary | ICD-10-CM | POA: Diagnosis not present

## 2018-09-25 HISTORY — DX: Mixed hyperlipidemia: E78.2

## 2018-09-25 HISTORY — DX: Sleep apnea, unspecified: G47.30

## 2018-09-25 HISTORY — PX: FOOT ARTHRODESIS: SHX1655

## 2018-09-25 SURGERY — FUSION, JOINT, FOOT
Anesthesia: General | Site: Foot | Laterality: Left

## 2018-09-25 MED ORDER — ESMOLOL HCL 100 MG/10ML IV SOLN
INTRAVENOUS | Status: DC | PRN
  Administered 2018-09-25 (×2): 20 mg via INTRAVENOUS

## 2018-09-25 MED ORDER — PHENYLEPHRINE 40 MCG/ML (10ML) SYRINGE FOR IV PUSH (FOR BLOOD PRESSURE SUPPORT)
PREFILLED_SYRINGE | INTRAVENOUS | Status: AC
  Filled 2018-09-25: qty 10

## 2018-09-25 MED ORDER — PHENYLEPHRINE 40 MCG/ML (10ML) SYRINGE FOR IV PUSH (FOR BLOOD PRESSURE SUPPORT)
PREFILLED_SYRINGE | INTRAVENOUS | Status: DC | PRN
  Administered 2018-09-25 (×2): 80 ug via INTRAVENOUS
  Administered 2018-09-25: 160 ug via INTRAVENOUS
  Administered 2018-09-25 (×4): 80 ug via INTRAVENOUS

## 2018-09-25 MED ORDER — LIDOCAINE 2% (20 MG/ML) 5 ML SYRINGE
INTRAMUSCULAR | Status: AC
  Filled 2018-09-25: qty 5

## 2018-09-25 MED ORDER — ONDANSETRON HCL 4 MG/2ML IJ SOLN
INTRAMUSCULAR | Status: AC
  Filled 2018-09-25: qty 2

## 2018-09-25 MED ORDER — SUCCINYLCHOLINE CHLORIDE 200 MG/10ML IV SOSY
PREFILLED_SYRINGE | INTRAVENOUS | Status: AC
  Filled 2018-09-25: qty 10

## 2018-09-25 MED ORDER — ASPIRIN EC 81 MG PO TBEC: 81.0000 mg | DELAYED_RELEASE_TABLET | Freq: Two times a day (BID) | ORAL | 0 refills | Status: DC

## 2018-09-25 MED ORDER — PROPOFOL 10 MG/ML IV BOLUS
INTRAVENOUS | Status: DC | PRN
  Administered 2018-09-25 (×2): 300 mg via INTRAVENOUS

## 2018-09-25 MED ORDER — LIDOCAINE 2% (20 MG/ML) 5 ML SYRINGE
INTRAMUSCULAR | Status: DC | PRN
  Administered 2018-09-25: 80 mg via INTRAVENOUS

## 2018-09-25 MED ORDER — FENTANYL CITRATE (PF) 100 MCG/2ML IJ SOLN
INTRAMUSCULAR | Status: AC
  Filled 2018-09-25: qty 2

## 2018-09-25 MED ORDER — MEPERIDINE HCL 25 MG/ML IJ SOLN: 6.2500 mg | INTRAMUSCULAR | Status: DC | PRN

## 2018-09-25 MED ORDER — PROPOFOL 10 MG/ML IV BOLUS
INTRAVENOUS | Status: AC
  Filled 2018-09-25: qty 20

## 2018-09-25 MED ORDER — ACETAMINOPHEN 10 MG/ML IV SOLN: 1000.0000 mg | Freq: Once | INTRAVENOUS | Status: DC | PRN

## 2018-09-25 MED ORDER — SUCCINYLCHOLINE CHLORIDE 200 MG/10ML IV SOSY
PREFILLED_SYRINGE | INTRAVENOUS | Status: DC | PRN
  Administered 2018-09-25: 160 mg via INTRAVENOUS

## 2018-09-25 MED ORDER — FENTANYL CITRATE (PF) 100 MCG/2ML IJ SOLN
50.0000 ug | INTRAMUSCULAR | Status: DC | PRN
  Administered 2018-09-25: 10:00:00 via INTRAVENOUS

## 2018-09-25 MED ORDER — EPHEDRINE SULFATE-NACL 50-0.9 MG/10ML-% IV SOSY
PREFILLED_SYRINGE | INTRAVENOUS | Status: DC | PRN
  Administered 2018-09-25: 10 mg via INTRAVENOUS
  Administered 2018-09-25: 5 mg via INTRAVENOUS

## 2018-09-25 MED ORDER — MIDAZOLAM HCL 2 MG/2ML IJ SOLN
INTRAMUSCULAR | Status: AC
  Filled 2018-09-25: qty 2

## 2018-09-25 MED ORDER — HYDRALAZINE HCL 20 MG/ML IJ SOLN
INTRAMUSCULAR | Status: AC
  Filled 2018-09-25: qty 1

## 2018-09-25 MED ORDER — CEFAZOLIN SODIUM-DEXTROSE 2-4 GM/100ML-% IV SOLN
INTRAVENOUS | Status: AC
  Filled 2018-09-25: qty 100

## 2018-09-25 MED ORDER — OXYCODONE HCL 5 MG/5ML PO SOLN: 5.0000 mg | Freq: Once | ORAL | Status: DC | PRN

## 2018-09-25 MED ORDER — CHLORHEXIDINE GLUCONATE 4 % EX LIQD: 60.0000 mL | Freq: Once | CUTANEOUS | Status: DC

## 2018-09-25 MED ORDER — LACTATED RINGERS IV SOLN: INTRAVENOUS | Status: DC

## 2018-09-25 MED ORDER — ESMOLOL HCL 100 MG/10ML IV SOLN
INTRAVENOUS | Status: AC
  Filled 2018-09-25: qty 20

## 2018-09-25 MED ORDER — SODIUM CHLORIDE 0.9 % IV SOLN: INTRAVENOUS | Status: DC

## 2018-09-25 MED ORDER — PROMETHAZINE HCL 25 MG/ML IJ SOLN: 6.2500 mg | INTRAMUSCULAR | Status: DC | PRN

## 2018-09-25 MED ORDER — ACETAMINOPHEN 325 MG PO TABS: 325.0000 mg | ORAL_TABLET | Freq: Once | ORAL | Status: DC | PRN

## 2018-09-25 MED ORDER — LACTATED RINGERS IV SOLN
INTRAVENOUS | Status: DC
  Administered 2018-09-25 (×2): via INTRAVENOUS

## 2018-09-25 MED ORDER — CEFAZOLIN SODIUM-DEXTROSE 1-4 GM/50ML-% IV SOLN
INTRAVENOUS | Status: AC
  Filled 2018-09-25: qty 50

## 2018-09-25 MED ORDER — SCOPOLAMINE 1 MG/3DAYS TD PT72: 1.0000 | MEDICATED_PATCH | Freq: Once | TRANSDERMAL | Status: DC

## 2018-09-25 MED ORDER — SENNA 8.6 MG PO TABS: 2.0000 | ORAL_TABLET | Freq: Two times a day (BID) | ORAL | 0 refills | Status: DC

## 2018-09-25 MED ORDER — CEFAZOLIN SODIUM-DEXTROSE 2-4 GM/100ML-% IV SOLN
2.0000 g | INTRAVENOUS | Status: AC
  Administered 2018-09-25: 3 g via INTRAVENOUS

## 2018-09-25 MED ORDER — OXYCODONE HCL 5 MG PO TABS: 5.0000 mg | ORAL_TABLET | ORAL | 0 refills | Status: AC | PRN

## 2018-09-25 MED ORDER — BUPIVACAINE-EPINEPHRINE (PF) 0.5% -1:200000 IJ SOLN
INTRAMUSCULAR | Status: DC | PRN
  Administered 2018-09-25: 30 mL via PERINEURAL
  Administered 2018-09-25: 20 mL via PERINEURAL

## 2018-09-25 MED ORDER — DOCUSATE SODIUM 100 MG PO CAPS: 100.0000 mg | ORAL_CAPSULE | Freq: Two times a day (BID) | ORAL | 0 refills | Status: DC

## 2018-09-25 MED ORDER — FENTANYL CITRATE (PF) 100 MCG/2ML IJ SOLN
INTRAMUSCULAR | Status: DC | PRN
  Administered 2018-09-25: 50 ug via INTRAVENOUS

## 2018-09-25 MED ORDER — OXYCODONE HCL 5 MG PO TABS: 5.0000 mg | ORAL_TABLET | Freq: Once | ORAL | Status: DC | PRN

## 2018-09-25 MED ORDER — DEXAMETHASONE SODIUM PHOSPHATE 10 MG/ML IJ SOLN
INTRAMUSCULAR | Status: AC
  Filled 2018-09-25: qty 1

## 2018-09-25 MED ORDER — HYDRALAZINE HCL 20 MG/ML IJ SOLN
INTRAMUSCULAR | Status: DC | PRN
  Administered 2018-09-25: 5 mg via INTRAVENOUS

## 2018-09-25 MED ORDER — MIDAZOLAM HCL 2 MG/2ML IJ SOLN
1.0000 mg | INTRAMUSCULAR | Status: DC | PRN
  Administered 2018-09-25: 10:00:00 2 mg via INTRAVENOUS

## 2018-09-25 MED ORDER — ACETAMINOPHEN 160 MG/5ML PO SOLN: 325.0000 mg | Freq: Once | ORAL | Status: DC | PRN

## 2018-09-25 MED ORDER — ONDANSETRON HCL 4 MG/2ML IJ SOLN
INTRAMUSCULAR | Status: DC | PRN
  Administered 2018-09-25: 4 mg via INTRAVENOUS

## 2018-09-25 MED ORDER — EPHEDRINE 5 MG/ML INJ
INTRAVENOUS | Status: AC
  Filled 2018-09-25: qty 10

## 2018-09-25 MED ORDER — DEXAMETHASONE SODIUM PHOSPHATE 10 MG/ML IJ SOLN
INTRAMUSCULAR | Status: DC | PRN
  Administered 2018-09-25: 10 mg via INTRAVENOUS

## 2018-09-25 MED ORDER — FENTANYL CITRATE (PF) 100 MCG/2ML IJ SOLN: 25.0000 ug | INTRAMUSCULAR | Status: DC | PRN

## 2018-09-25 MED ORDER — 0.9 % SODIUM CHLORIDE (POUR BTL) OPTIME
TOPICAL | Status: DC | PRN
  Administered 2018-09-25: 60 mL

## 2018-09-25 SURGICAL SUPPLY — 68 items
BANDAGE ESMARK 6X9 LF (GAUZE/BANDAGES/DRESSINGS) IMPLANT
BIT DRILL 4.8X200 CANN (BIT) ×1 IMPLANT
BIT TREPHINE CORING 8 (BIT) IMPLANT
BLADE AVERAGE 25X9 (BLADE) IMPLANT
BLADE MICRO SAGITTAL (BLADE) IMPLANT
BLADE SURG 15 STRL LF DISP TIS (BLADE) ×2 IMPLANT
BLADE SURG 15 STRL SS (BLADE) ×2
BNDG COHESIVE 4X5 TAN STRL (GAUZE/BANDAGES/DRESSINGS) ×2 IMPLANT
BNDG COHESIVE 6X5 TAN STRL LF (GAUZE/BANDAGES/DRESSINGS) ×2 IMPLANT
BNDG ESMARK 6X9 LF (GAUZE/BANDAGES/DRESSINGS) ×2
CHLORAPREP W/TINT 26 (MISCELLANEOUS) ×2 IMPLANT
COVER BACK TABLE REUSABLE LG (DRAPES) ×2 IMPLANT
COVER WAND RF STERILE (DRAPES) IMPLANT
CUFF TOURN SGL QUICK 34 (TOURNIQUET CUFF)
CUFF TOURN SGL QUICK 42 (TOURNIQUET CUFF) ×1 IMPLANT
CUFF TRNQT CYL 34X4.125X (TOURNIQUET CUFF) IMPLANT
DRAPE EXTREMITY T 121X128X90 (DISPOSABLE) ×2 IMPLANT
DRAPE OEC MINIVIEW 54X84 (DRAPES) ×2 IMPLANT
DRAPE U-SHAPE 47X51 STRL (DRAPES) ×2 IMPLANT
DRSG MEPITEL 4X7.2 (GAUZE/BANDAGES/DRESSINGS) ×2 IMPLANT
DRSG PAD ABDOMINAL 8X10 ST (GAUZE/BANDAGES/DRESSINGS) ×4 IMPLANT
ELECT REM PT RETURN 9FT ADLT (ELECTROSURGICAL) ×2
ELECTRODE REM PT RTRN 9FT ADLT (ELECTROSURGICAL) ×1 IMPLANT
GAUZE SPONGE 4X4 12PLY STRL (GAUZE/BANDAGES/DRESSINGS) ×2 IMPLANT
GLOVE BIO SURGEON STRL SZ7 (GLOVE) ×1 IMPLANT
GLOVE BIO SURGEON STRL SZ8 (GLOVE) ×2 IMPLANT
GLOVE BIOGEL PI IND STRL 7.5 (GLOVE) IMPLANT
GLOVE BIOGEL PI IND STRL 8 (GLOVE) ×2 IMPLANT
GLOVE BIOGEL PI INDICATOR 7.5 (GLOVE) ×1
GLOVE BIOGEL PI INDICATOR 8 (GLOVE) ×2
GLOVE ECLIPSE 8.0 STRL XLNG CF (GLOVE) ×2 IMPLANT
GOWN STRL REUS W/ TWL LRG LVL3 (GOWN DISPOSABLE) ×1 IMPLANT
GOWN STRL REUS W/ TWL XL LVL3 (GOWN DISPOSABLE) ×2 IMPLANT
GOWN STRL REUS W/TWL LRG LVL3 (GOWN DISPOSABLE) ×2
GOWN STRL REUS W/TWL XL LVL3 (GOWN DISPOSABLE) ×2
K-WIRE 9  SMOOTH .062 (WIRE) IMPLANT
KIT ARCUS SIZING TEMPLATE STRL (KITS) IMPLANT
NDL SAFETY ECLIPSE 18X1.5 (NEEDLE) IMPLANT
NEEDLE HYPO 18GX1.5 SHARP (NEEDLE)
NEEDLE HYPO 22GX1.5 SAFETY (NEEDLE) ×2 IMPLANT
PACK BASIN DAY SURGERY FS (CUSTOM PROCEDURE TRAY) ×2 IMPLANT
PAD CAST 4YDX4 CTTN HI CHSV (CAST SUPPLIES) ×1 IMPLANT
PADDING CAST COTTON 4X4 STRL (CAST SUPPLIES) ×1
PADDING CAST COTTON 6X4 STRL (CAST SUPPLIES) ×2 IMPLANT
PENCIL BUTTON HOLSTER BLD 10FT (ELECTRODE) ×2 IMPLANT
PIN GUIDE DRILL TIP 2.8X300 (DRILL) ×2 IMPLANT
SANITIZER HAND PURELL 535ML FO (MISCELLANEOUS) ×2 IMPLANT
SCREW 8.0X80MMX16 (Screw) ×1 IMPLANT
SCREW PARTIAL THREAD 8.0X90MM (Screw) ×1 IMPLANT
SHEET MEDIUM DRAPE 40X70 STRL (DRAPES) ×2 IMPLANT
SLEEVE SCD COMPRESS KNEE MED (MISCELLANEOUS) ×2 IMPLANT
SPLINT FAST PLASTER 5X30 (CAST SUPPLIES) ×20
SPLINT PLASTER CAST FAST 5X30 (CAST SUPPLIES) ×20 IMPLANT
SPONGE LAP 18X18 RF (DISPOSABLE) ×2 IMPLANT
SPONGE SURGIFOAM ABS GEL 12-7 (HEMOSTASIS) IMPLANT
STOCKINETTE 6  STRL (DRAPES) ×1
STOCKINETTE 6 STRL (DRAPES) ×1 IMPLANT
SUCTION FRAZIER HANDLE 10FR (MISCELLANEOUS) ×1
SUCTION TUBE FRAZIER 10FR DISP (MISCELLANEOUS) ×1 IMPLANT
SUT ETHILON 3 0 PS 1 (SUTURE) ×2 IMPLANT
SUT MNCRL AB 3-0 PS2 18 (SUTURE) ×2 IMPLANT
SUT VIC AB 0 SH 27 (SUTURE) IMPLANT
SUT VIC AB 2-0 SH 27 (SUTURE) ×1
SUT VIC AB 2-0 SH 27XBRD (SUTURE) ×1 IMPLANT
SYR BULB 3OZ (MISCELLANEOUS) ×2 IMPLANT
TOWEL GREEN STERILE FF (TOWEL DISPOSABLE) ×4 IMPLANT
TUBE CONNECTING 20X1/4 (TUBING) ×2 IMPLANT
UNDERPAD 30X30 (UNDERPADS AND DIAPERS) ×2 IMPLANT

## 2018-09-25 NOTE — Anesthesia Procedure Notes (Signed)
Anesthesia Regional Block: Adductor canal block   Pre-Anesthetic Checklist: ,, timeout performed, Correct Patient, Correct Site, Correct Laterality, Correct Procedure, Correct Position, site marked, Risks and benefits discussed,  Surgical consent,  Pre-op evaluation,  At surgeon's request and post-op pain management  Laterality: Left  Prep: chloraprep       Needles:  Injection technique: Single-shot  Needle Type: Echogenic Stimulator Needle     Needle Length: 9cm  Needle Gauge: 21     Additional Needles:   Procedures:,,,, ultrasound used (permanent image in chart),,,,  Narrative:  Start time: 09/25/2018 10:25 AM End time: 09/25/2018 10:30 AM Injection made incrementally with aspirations every 5 mL.  Performed by: Personally  Anesthesiologist: Effie Berkshire, MD  Additional Notes: Patient tolerated the procedure well. Local anesthetic introduced in an incremental fashion under minimal resistance after negative aspirations. No paresthesias were elicited. After completion of the procedure, no acute issues were identified and patient continued to be monitored by RN.

## 2018-09-25 NOTE — Anesthesia Procedure Notes (Addendum)
Procedure Name: LMA Insertion Date/Time: 09/25/2018 10:48 AM Performed by: Gwyndolyn Saxon, CRNA Pre-anesthesia Checklist: Patient identified, Emergency Drugs available, Suction available and Patient being monitored Patient Re-evaluated:Patient Re-evaluated prior to induction Oxygen Delivery Method: Circle System Utilized Preoxygenation: Pre-oxygenation with 100% oxygen Induction Type: IV induction Ventilation: Mask ventilation without difficulty LMA: LMA with gastric port inserted LMA Size: 5.0 Number of attempts: 1 Airway Equipment and Method: Bite block and Patient positioned with wedge pillow Placement Confirmation: positive ETCO2 Tube secured with: Tape Dental Injury: Teeth and Oropharynx as per pre-operative assessment

## 2018-09-25 NOTE — Discharge Instructions (Addendum)
Cody Hewitt, MD °[facility] ° °Please read the following information regarding your care after surgery. ° °Medications  °You only need a prescription for the narcotic pain medicine (ex. oxycodone, Percocet, Norco).  All of the other medicines listed below are available over the counter. °X Aleve 2 pills twice a day for the first 3 days after surgery. °X acetominophen (Tylenol) 650 mg every 4-6 hours as you need for minor to moderate pain °X oxycodone as prescribed for severe pain ° °Narcotic pain medicine (ex. oxycodone, Percocet, Vicodin) will cause constipation.  To prevent this problem, take the following medicines while you are taking any pain medicine. °X docusate sodium (Colace) 100 mg twice a day X senna (Senokot) 2 tablets twice a day ° °X To help prevent blood clots, take a baby aspirin (81 mg) twice a day after surgery.  You should also get up every hour while you are awake to move around.   ° °Weight Bearing °X Do not bear any weight on the operated leg or foot. ° °Cast / Splint / Dressing °X Keep your splint, cast or dressing clean and dry.  Don’t put anything (coat hanger, pencil, etc) down inside of it.  If it gets damp, use a hair dryer on the cool setting to dry it.  If it gets soaked, call the office to schedule an appointment for a cast change. ° ° °After your dressing, cast or splint is removed; you may shower, but do not soak or scrub the wound.  Allow the water to run over it, and then gently pat it dry. ° °Swelling °It is normal for you to have swelling where you had surgery.  To reduce swelling and pain, keep your toes above your nose for at least 3 days after surgery.  It may be necessary to keep your foot or leg elevated for several weeks.  If it hurts, it should be elevated. ° °Follow Up °Call my office at [phone] when you are discharged from the hospital or surgery center to schedule an appointment to be seen two weeks after surgery. ° °Call my office at [phone] if you develop a  fever >101.5° F, nausea, vomiting, bleeding from the surgical site or severe pain.   ° °Post Anesthesia Home Care Instructions ° °Activity: °Get plenty of rest for the remainder of the day. A responsible individual must stay with you for 24 hours following the procedure.  °For the next 24 hours, DO NOT: °-Drive a car °-Operate machinery °-Drink alcoholic beverages °-Take any medication unless instructed by your physician °-Make any legal decisions or sign important papers. ° °Meals: °Start with liquid foods such as gelatin or soup. Progress to regular foods as tolerated. Avoid greasy, spicy, heavy foods. If nausea and/or vomiting occur, drink only clear liquids until the nausea and/or vomiting subsides. Call your physician if vomiting continues. ° °Special Instructions/Symptoms: °Your throat may feel dry or sore from the anesthesia or the breathing tube placed in your throat during surgery. If this causes discomfort, gargle with warm salt water. The discomfort should disappear within 24 hours. ° °If you had a scopolamine patch placed behind your ear for the management of post- operative nausea and/or vomiting: ° °1. The medication in the patch is effective for 72 hours, after which it should be removed.  Wrap patch in a tissue and discard in the trash. Wash hands thoroughly with soap and water. °2. You may remove the patch earlier than 72 hours if you experience unpleasant side effects which   may include dry mouth, dizziness or visual disturbances. °3. Avoid touching the patch. Wash your hands with soap and water after contact with the patch. °   °Regional Anesthesia Blocks ° °1. Numbness or the inability to move the "blocked" extremity may last from 3-48 hours after placement. The length of time depends on the medication injected and your individual response to the medication. If the numbness is not going away after 48 hours, call your surgeon. ° °2. The extremity that is blocked will need to be protected until the  numbness is gone and the  Strength has returned. Because you cannot feel it, you will need to take extra care to avoid injury. Because it may be weak, you may have difficulty moving it or using it. You may not know what position it is in without looking at it while the block is in effect. ° °3. For blocks in the legs and feet, returning to weight bearing and walking needs to be done carefully. You will need to wait until the numbness is entirely gone and the strength has returned. You should be able to move your leg and foot normally before you try and bear weight or walk. You will need someone to be with you when you first try to ensure you do not fall and possibly risk injury. ° °4. Bruising and tenderness at the needle site are common side effects and will resolve in a few days. ° °5. Persistent numbness or new problems with movement should be communicated to the surgeon or the [facility] ([phone])/ [facility] ([phone]). ° ° °

## 2018-09-25 NOTE — Anesthesia Procedure Notes (Signed)
Anesthesia Regional Block: Popliteal block   Pre-Anesthetic Checklist: ,, timeout performed, Correct Patient, Correct Site, Correct Laterality, Correct Procedure, Correct Position, site marked, Risks and benefits discussed,  Surgical consent,  Pre-op evaluation,  At surgeon's request and post-op pain management  Laterality: Left  Prep: chloraprep       Needles:  Injection technique: Single-shot  Needle Type: Echogenic Stimulator Needle     Needle Length: 9cm  Needle Gauge: 21     Additional Needles:   Procedures:, nerve stimulator,,, ultrasound used (permanent image in chart),,,,  Narrative:  Start time: 09/25/2018 10:20 AM End time: 09/25/2018 10:25 AM Injection made incrementally with aspirations every 5 mL.  Events: blood aspirated,,,,,,,,,,  Performed by: Personally  Anesthesiologist: Effie Berkshire, MD  Additional Notes: Patient tolerated the procedure well. Local anesthetic introduced in an incremental fashion under minimal resistance after negative aspirations. No paresthesias were elicited. After completion of the procedure, no acute issues were identified and patient continued to be monitored by RN.

## 2018-09-25 NOTE — Anesthesia Preprocedure Evaluation (Addendum)
Anesthesia Evaluation  Patient identified by MRN, date of birth, ID band Patient awake    Reviewed: Allergy & Precautions, NPO status , Patient's Chart, lab work & pertinent test results  Airway Mallampati: II  TM Distance: >3 FB Neck ROM: Full    Dental  (+) Teeth Intact, Dental Advisory Given   Pulmonary sleep apnea , former smoker,    breath sounds clear to auscultation       Cardiovascular hypertension, Pt. on medications  Rhythm:Regular Rate:Normal     Neuro/Psych negative neurological ROS  negative psych ROS   GI/Hepatic Neg liver ROS, GERD  Controlled and Medicated,  Endo/Other  diabetes  Renal/GU Renal disease     Musculoskeletal negative musculoskeletal ROS (+)   Abdominal (+) + obese,   Peds  Hematology negative hematology ROS (+)   Anesthesia Other Findings   Reproductive/Obstetrics                            Anesthesia Physical Anesthesia Plan  ASA: III  Anesthesia Plan: General   Post-op Pain Management: GA combined w/ Regional for post-op pain   Induction: Intravenous  PONV Risk Score and Plan: 3 and Ondansetron, Dexamethasone and Midazolam  Airway Management Planned: LMA  Additional Equipment: None  Intra-op Plan:   Post-operative Plan: Extubation in OR  Informed Consent: I have reviewed the patients History and Physical, chart, labs and discussed the procedure including the risks, benefits and alternatives for the proposed anesthesia with the patient or authorized representative who has indicated his/her understanding and acceptance.     Dental advisory given  Plan Discussed with: CRNA  Anesthesia Plan Comments:        Anesthesia Quick Evaluation

## 2018-09-25 NOTE — Op Note (Signed)
09/25/2018  12:59 PM  PATIENT:  Cody [name]  48 y.o. male  PRE-OPERATIVE DIAGNOSIS: 1.  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  Left subtalar joint arthritis  POST-OPERATIVE DIAGNOSIS: 1.  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  Left subtalar joint arthritis 3.  Left talus unstable os trigonum   Procedure(s): 1.  Left peroneus longus tenolysis 2.  Left peroneus brevis tenolysis 3.  Excision of left talus os trigonum 4.  Left subtalar joint arthrodesis 5.  AP and lateral radiographs of the left ankle 6.  AP, lateral and Harris heel radiographs of the left foot  SURGEON:  Wylene Simmer, MD  ASSISTANT: Mechele Claude, PA-C  ANESTHESIA:   General, regional  EBL:  minimal   TOURNIQUET:   Total Tourniquet Time Documented: Thigh (Left) - 4 minutes Thigh (Left) - 62 minutes Total: Thigh (Left) - 66 minutes  COMPLICATIONS:  None apparent  DISPOSITION:  Extubated, awake and stable to recovery.  INDICATION FOR PROCEDURE: The patient is a 48 year old male with a history of an injury to his left ankle.  He has signs and symptoms of peroneal tenosynovitis and subtalar joint arthritis.  He presents now for operative treatment of these painful and limiting conditions having failed nonoperative treatment to date.  The risks and benefits of the alternative treatment options have been discussed in detail.  The patient wishes to proceed with surgery and specifically understands risks of bleeding, infection, nerve damage, blood clots, need for additional surgery, amputation and death.  PROCEDURE IN DETAIL:  After pre operative consent was obtained, and the correct operative site was identified, the patient was brought to the operating room and placed supine on the OR table.  Anesthesia was administered.  Pre-operative antibiotics were administered.  A surgical timeout was taken.  The left lower extremity was prepped and draped in standard sterile fashion with a  tourniquet around the thigh.  The extremity was exsanguinated and the tourniquet was inflated to 350 mmHg.  A curvilinear incision was made along the sinus Tarsi adjacent to the peroneal tendons.  Dissection was carried down through the subcutaneous tissues.  The interval between the peroneus brevis and the extensor digitorum brevis was developed.  The peroneal tendon sheath was identified.  It was opened and both peroneal tendons were carefully inspected.  Both were noted to have tenosynovitis, but there was no evidence of peroneal tendon tear or degenerative changes.  Scissors were used to perform a careful tenolysis along the peroneus brevis removing all adherent soft tissue.  A tenolysis was then carried carefully along the peroneus brevis again with scissors.  The sinus Tarsi was then opened sharply and the cervical ligament was divided.  The calcaneofibular ligament was released from its insertion allowing adequate exposure of the subtalar joint.  There were degenerative changes noted at the lateral aspect of the joint.  The remaining articular cartilage and subchondral bone was removed from both sides of the joint with elevators, curettes and rongeurs.  Once the subtalar joint was cleared of articular cartilage the posterior aspect of the joint could be visualized.  A large unstable os trigonum was identified.  It was felt that this could represent a secondary pain generator.  The decision was made to proceed with excision of the os trigonum.  Dissection was then carried posteriorly around the posterior facet.  The os trigonum was elevated using an angled curette.  It was morselized and removed in its entirety with a rondure.  A drill bit  was then used to perforate the remaining subchondral bone on both sides of the posterior facet.  The resulting bone graft was left in place.  The joint was then reduced.  A guidewire was introduced from the calcaneal tuberosity at the level of the glabrous border.  The  guidewire was advanced across the posterior facet of the subtalar joint into the dome of the talus.  Ankle and foot radiographs confirmed appropriate alignment of the guidewire.  An incision was made and the guidewire was overdrilled.  An 8 mm partially-threaded Biomet cannulated screw was inserted.  It was passed across the subtalar joint and was noted to compress the subtalar joint appropriately.  A second guidewire was then inserted from the calcaneal tuberosity across the subtalar joint into the head of the talus.  Ankle and foot radiographs confirmed appropriate alignment of that guidewire.  It was overdrilled and a partially-threaded cannulated screw was inserted.  It was also noted to have excellent purchase.  Final AP and lateral ankle films were obtained as well as AP, lateral and Harris heel foot films.  These all show appropriate alignment of both screws and appropriate reduction of the subtalar joint.  Complete excision of the large os trigonum is noted as well.  The wounds were irrigated copiously.  The heel wounds were closed with nylon.  The joint capsule was repaired over the subtalar joint with 0 Vicryl.  The subcutaneous tissues were approximated with 3-0 Monocryl.  Skin incision was closed with a running 3-0 nylon.  Sterile dressings were applied followed by a well-padded short leg splint.  The tourniquet was released after application of the dressings.  The patient was awakened from anesthesia and transported to the recovery room in stable condition.   FOLLOW UP PLAN: Nonweightbearing on the left lower extremity.  Follow-up in the office in 2 weeks for suture removal and conversion to a short leg cast.  Aspirin for DVT prophylaxis.   RADIOGRAPHS: AP and lateral radiographs of the left ankle are obtained intraoperatively.  These show appropriate alignment of the guidepin crossing the posterior facet.  Excision of the os trigonum is noted in the interim.  AP, lateral and Harris heel  radiographs of the left foot are obtained intraoperatively.  These show interval arthrodesis of the subtalar joint with appropriate position and length of all hardware.  Interval excision of the os trigonum is also noted.    Alfredo MartinezJustin Ollis PA-C was present and scrubbed for the duration of the operative case. His assistance was essential in positioning the patient, prepping and draping, gaining and maintaining exposure, performing the operation, closing and dressing the wounds and applying the splint.

## 2018-09-25 NOTE — Progress Notes (Signed)
Assisted Dr. Hollis with left, ultrasound guided, popliteal, adductor canal block. Side rails up, monitors on throughout procedure. See vital signs in flow sheet. Tolerated Procedure well. 

## 2018-09-25 NOTE — Anesthesia Procedure Notes (Addendum)
Procedure Name: Intubation Date/Time: 09/25/2018 11:27 AM Performed by: Gwyndolyn Saxon, CRNA Pre-anesthesia Checklist: Patient identified, Emergency Drugs available, Suction available and Patient being monitored Patient Re-evaluated:Patient Re-evaluated prior to induction Oxygen Delivery Method: Circle System Utilized Preoxygenation: Pre-oxygenation with 100% oxygen Induction Type: IV induction, Rapid sequence and Cricoid Pressure applied Ventilation: Mask ventilation without difficulty Laryngoscope Size: Miller and 3 Grade View: Grade III Tube type: Oral Tube size: 8.0 mm Number of attempts: 1 Airway Equipment and Method: Stylet,  Oral airway and Patient positioned with wedge pillow Placement Confirmation: ETT inserted through vocal cords under direct vision,  positive ETCO2 and breath sounds checked- equal and bilateral Secured at: 23 cm Tube secured with: Tape Dental Injury: Teeth and Oropharynx as per pre-operative assessment  Difficulty Due To: Difficulty was anticipated and Difficult Airway- due to large tongue Future Recommendations: Recommend- induction with short-acting agent, and alternative techniques readily available

## 2018-09-25 NOTE — H&P (Signed)
Cody [name] is an 48 y.o. male.   Chief Complaint:  Left ankle pain HPI: The patient is a 48 year old male with a past medical history significant for sleep apnea.  He had an injury to his left ankle last year and has persistent lateral hindfoot pain.  He has failed nonoperative treatment to date including activity modification, oral anti-inflammatories, bracing and physical therapy.  A diagnostic injection in his subtalar joint relieved his pain.  He presents now for peroneal tendon exploration and debridement or repair and subtalar joint arthrodesis.  Past Medical History:  Diagnosis Date  . Acid reflux   . Hypertension   . Mixed hyperlipidemia   . Sleep apnea     Past Surgical History:  Procedure Laterality Date  . KNEE SURGERY      History reviewed. No pertinent family history. Social History:  reports that he has quit smoking. He has never used smokeless tobacco. He reports that he does not drink alcohol or use drugs.  Allergies: No Known Allergies  Medications Prior to Admission  Medication Sig Dispense Refill  . amLODipine (NORVASC) 10 MG tablet amlodipine 10 mg tablet  TAKE ONE TABLET BY MOUTH ONE TIME DAILY    . atorvastatin (LIPITOR) 40 MG tablet atorvastatin 40 mg tablet  TAKE ONE TABLET BY MOUTH ONE TIME DAILY    . chlorthalidone (HYGROTON) 25 MG tablet Take 25 mg by mouth daily.    Marland Kitchen ibuprofen (ADVIL,MOTRIN) 600 MG tablet Take 1 tablet (600 mg total) by mouth every 6 (six) hours as needed. 30 tablet 0  . lisinopril (ZESTRIL) 40 MG tablet Take 40 mg by mouth daily.    . naproxen (NAPROSYN) 500 MG tablet naproxen 500 mg tablet  TAKE ONE TABLET BY MOUTH TWICE A DAY    . ondansetron (ZOFRAN ODT) 4 MG disintegrating tablet Take 1 tablet (4 mg total) by mouth every 8 (eight) hours as needed for nausea or vomiting. 6 tablet 0  . traMADol (ULTRAM) 50 MG tablet tramadol 50 mg tablet  TAKE ONE TABLET BY MOUTH EVERY 6 HOURS      No results found for this or any previous visit  (from the past 48 hour(s)). No results found.  ROS no recent fever, chills, nausea, vomiting or changes in his appetite  Blood pressure (!) 145/90, pulse 75, temperature 97.7 F (36.5 C), temperature source Oral, resp. rate 18, height 6\' 2"  (1.88 m), weight (!) 150 kg, SpO2 100 %. Physical Exam  Well-nourished well-developed male in no apparent distress.  Alert and oriented x4.  Mood and affect are normal.  Extraocular motions are intact.  Respirations are unlabored.  Gait is antalgic to the left.  The left ankle and hindfoot have lateral swelling.  He is tender to palpation at the sinus Tarsi and along the course of the peroneal tendons.  4 out of 5 strength in plantarflexion and eversion.  Pulses are palpable in the foot.  Sensibility to light touch is intact dorsally and plantarly at the forefoot.    Assessment/Plan Left subtalar joint arthritis and peroneal tenosynovitis -to the operating room today for surgical treatment.  The risks and benefits of the alternative treatment options have been discussed in detail.  The patient wishes to proceed with surgery and specifically understands risks of bleeding, infection, nerve damage, blood clots, need for additional surgery, amputation and death.   Wylene Simmer, MD 09/30/2018, 10:26 AM

## 2018-09-25 NOTE — Transfer of Care (Signed)
Immediate Anesthesia Transfer of Care Note  Patient: Logen Guay  Procedure(s) Performed: Left subtalar joint arthrodesis and peroneal tendon repair (Left Foot)  Patient Location: PACU  Anesthesia Type:General and Regional  Level of Consciousness: drowsy and patient cooperative  Airway & Oxygen Therapy: Patient Spontanous Breathing and Patient connected to face mask oxygen  Post-op Assessment: Report given to RN and Post -op Vital signs reviewed and stable  Post vital signs: Reviewed and stable  Last Vitals:  Vitals Value Taken Time  BP 153/97 09/25/18 1258  Temp    Pulse 84 09/25/18 1300  Resp 24 09/25/18 1300  SpO2 100 % 09/25/18 1300  Vitals shown include unvalidated device data.  Last Pain:  Vitals:   09/25/18 0855  TempSrc: Oral  PainSc: 0-No pain         Complications: No apparent anesthesia complications

## 2018-09-29 ENCOUNTER — Encounter (HOSPITAL_BASED_OUTPATIENT_CLINIC_OR_DEPARTMENT_OTHER): Payer: Self-pay | Admitting: Orthopedic Surgery

## 2018-09-29 NOTE — Anesthesia Postprocedure Evaluation (Signed)
Anesthesia Post Note  Patient: Alp Faulds  Procedure(s) Performed: Left subtalar joint arthrodesis and peroneal tendon repair (Left Foot)     Patient location during evaluation: PACU Anesthesia Type: General Level of consciousness: awake and alert Pain management: pain level controlled Vital Signs Assessment: post-procedure vital signs reviewed and stable Respiratory status: spontaneous breathing, nonlabored ventilation, respiratory function stable and patient connected to nasal cannula oxygen Cardiovascular status: blood pressure returned to baseline and stable Postop Assessment: no apparent nausea or vomiting Anesthetic complications: no    Last Vitals:  Vitals:   09/25/18 1345 09/25/18 1430  BP: (!) 169/112 (!) 179/100  Pulse: 70 72  Resp: 11 20  Temp:  36.6 C  SpO2: 100% 100%    Last Pain:  Vitals:   09/25/18 1430  TempSrc: Oral  PainSc: 0-No pain                 Effie Berkshire

## 2019-03-29 ENCOUNTER — Encounter (HOSPITAL_BASED_OUTPATIENT_CLINIC_OR_DEPARTMENT_OTHER): Payer: Self-pay

## 2019-03-29 ENCOUNTER — Other Ambulatory Visit: Payer: Self-pay

## 2019-03-29 ENCOUNTER — Emergency Department (HOSPITAL_BASED_OUTPATIENT_CLINIC_OR_DEPARTMENT_OTHER)
Admission: EM | Admit: 2019-03-29 | Discharge: 2019-03-29 | Disposition: A | Discharge status: Home / Self Care | Payer: PRIVATE HEALTH INSURANCE | Attending: Emergency Medicine | Admitting: Emergency Medicine

## 2019-03-29 DIAGNOSIS — E782 Mixed hyperlipidemia: Secondary | ICD-10-CM | POA: Insufficient documentation

## 2019-03-29 DIAGNOSIS — I10 Essential (primary) hypertension: Secondary | ICD-10-CM | POA: Insufficient documentation

## 2019-03-29 DIAGNOSIS — L02214 Cutaneous abscess of groin: Secondary | ICD-10-CM

## 2019-03-29 DIAGNOSIS — E1165 Type 2 diabetes mellitus with hyperglycemia: Secondary | ICD-10-CM | POA: Diagnosis not present

## 2019-03-29 DIAGNOSIS — Z7984 Long term (current) use of oral hypoglycemic drugs: Secondary | ICD-10-CM | POA: Diagnosis not present

## 2019-03-29 DIAGNOSIS — Z7982 Long term (current) use of aspirin: Secondary | ICD-10-CM | POA: Insufficient documentation

## 2019-03-29 DIAGNOSIS — Z79899 Other long term (current) drug therapy: Secondary | ICD-10-CM | POA: Insufficient documentation

## 2019-03-29 DIAGNOSIS — M79645 Pain in left finger(s): Secondary | ICD-10-CM | POA: Diagnosis present

## 2019-03-29 DIAGNOSIS — R739 Hyperglycemia, unspecified: Secondary | ICD-10-CM

## 2019-03-29 DIAGNOSIS — Z87891 Personal history of nicotine dependence: Secondary | ICD-10-CM | POA: Insufficient documentation

## 2019-03-29 DIAGNOSIS — L03012 Cellulitis of left finger: Secondary | ICD-10-CM

## 2019-03-29 LAB — CBG MONITORING, ED: Glucose-Capillary: 259 mg/dL — ABNORMAL HIGH (ref 70–99)

## 2019-03-29 MED ORDER — LIDOCAINE HCL 1 % IJ SOLN
INTRAMUSCULAR | Status: AC
  Administered 2019-03-29: 20 mL
  Filled 2019-03-29: qty 20

## 2019-03-29 MED ORDER — AMOXICILLIN-POT CLAVULANATE 875-125 MG PO TABS: 1.0000 | ORAL_TABLET | Freq: Two times a day (BID) | ORAL | 0 refills | Status: DC

## 2019-03-29 MED ORDER — LIDOCAINE HCL 1 % IJ SOLN
INTRAMUSCULAR | Status: AC
  Administered 2019-03-29: 23:00:00 20 mL
  Filled 2019-03-29: qty 20

## 2019-03-29 MED ORDER — AMOXICILLIN-POT CLAVULANATE 875-125 MG PO TABS
1.0000 | ORAL_TABLET | Freq: Once | ORAL | Status: AC
  Administered 2019-03-29: 1 via ORAL
  Filled 2019-03-29: qty 1

## 2019-03-29 NOTE — ED Provider Notes (Addendum)
[facility] EMERGENCY DEPARTMENT Provider Note   CSN: [id number] Arrival date & time: 03/29/19  1603     History Chief Complaint  Patient presents with  . Paronychia  . Mass    L Groin    Cody [name] is a 49 y.o. male who presents emergency department chief complaint of left finger infection.  Patient has had 2 days of worsening pain and swelling in the finger.  He states that he feels it is infected.  He has had throbbing pain in the finger.  He denies fevers or chills.  Patient also concern for a recent abscess in the left groin.  It got bigger, opened and drained pus he now has a small ulcer there.  He denies any fevers or chills.  No unprotected intercourse in many months.  Patient states that he does not believe he is diabetic.  He says that in the past he was told he was prediabetic.  HPI     Past Medical History:  Diagnosis Date  . Acid reflux   . Hypertension   . Mixed hyperlipidemia   . Sleep apnea     Patient Active Problem List   Diagnosis Date Noted  . New onset type 2 diabetes mellitus (HCC) 12/06/2015  . Essential hypertension 12/05/2015  . GERD (gastroesophageal reflux disease) 12/05/2015  . Cellulitis of leg, right 12/04/2015  . AKI (acute kidney injury) (HCC) 12/04/2015  . Thrombocytopenia (HCC) 12/04/2015    Past Surgical History:  Procedure Laterality Date  . FOOT ARTHRODESIS Left 09/25/2018   Procedure: Left subtalar joint arthrodesis and peroneal tendon repair;  Surgeon: Toni Arthurs, MD;  Location: [facility];  Service: Orthopedics;  Laterality: Left;  . KNEE SURGERY         No family history on file.  Social History   Tobacco Use  . Smoking status: Former Games developer  . Smokeless tobacco: Never Used  Substance Use Topics  . Alcohol use: No  . Drug use: No    Home Medications Prior to Admission medications   Medication Sig Start Date End Date Taking? Authorizing Provider  amLODipine (NORVASC) 10 MG tablet  amlodipine 10 mg tablet  TAKE ONE TABLET BY MOUTH ONE TIME DAILY 04/04/18   [provider]  aspirin EC 81 MG tablet Take 1 tablet (81 mg total) by mouth 2 (two) times daily. 09/25/18   Jacinta Shoe, PA-C  atorvastatin (LIPITOR) 40 MG tablet atorvastatin 40 mg tablet  TAKE ONE TABLET BY MOUTH ONE TIME DAILY 05/30/18   [provider]  chlorthalidone (HYGROTON) 25 MG tablet Take 25 mg by mouth daily.    [provider]  docusate sodium (COLACE) 100 MG capsule Take 1 capsule (100 mg total) by mouth 2 (two) times daily. While taking narcotic pain medicine. 09/25/18   Jacinta Shoe, PA-C  lisinopril (ZESTRIL) 40 MG tablet Take 40 mg by mouth daily.    [provider]  naproxen (NAPROSYN) 500 MG tablet naproxen 500 mg tablet  TAKE ONE TABLET BY MOUTH TWICE A DAY    [provider]  ondansetron (ZOFRAN ODT) 4 MG disintegrating tablet Take 1 tablet (4 mg total) by mouth every 8 (eight) hours as needed for nausea or vomiting. 08/30/18   Albrizze, Kaitlyn E, PA-C  senna (SENOKOT) 8.6 MG TABS tablet Take 2 tablets (17.2 mg total) by mouth 2 (two) times daily. 09/25/18   Jacinta Shoe, PA-C    Allergies    Patient has no known  allergies.  Review of Systems   Review of Systems Ten systems reviewed and are negative for acute change, except as noted in the HPI.   Physical Exam Updated Vital Signs BP (!) 163/123 (BP Location: Left Arm)   Pulse 71   Temp 98.2 F (36.8 C) (Oral)   Resp 20   Ht 6\' 2"  (1.88 m)   Wt (!) 147 kg   SpO2 100%   BMI 41.60 kg/m   Physical Exam Vitals and nursing note reviewed.  Constitutional:      General: He is not in acute distress.    Appearance: He is well-developed. He is not diaphoretic.  HENT:     Head: Normocephalic and atraumatic.  Eyes:     General: No scleral icterus.    Conjunctiva/sclera: Conjunctivae normal.  Cardiovascular:     Rate and Rhythm: Normal rate and regular rhythm.     Heart sounds:  Normal heart sounds.  Pulmonary:     Effort: Pulmonary effort is normal. No respiratory distress.     Breath sounds: Normal breath sounds.  Abdominal:     Palpations: Abdomen is soft.     Tenderness: There is no abdominal tenderness.  Genitourinary:   Musculoskeletal:     Cervical back: Normal range of motion and neck supple.     Comments: Purulence around the nailbed of the left middle finger  Skin:    General: Skin is warm and dry.  Neurological:     Mental Status: He is alert.  Psychiatric:        Behavior: Behavior normal.     ED Results / Procedures / Treatments   Labs (all labs ordered are listed, but only abnormal results are displayed) Labs Reviewed - No data to display  EKG None  Radiology No results found.  Procedures Drain paronychia  Date/Time: 03/30/2019 12:52 PM Performed by: 05/28/2019, PA-C Authorized by: Arthor Captain, PA-C  Consent: Verbal consent obtained. Written consent obtained. Risks and benefits: risks, benefits and alternatives were discussed Consent given by: patient Patient identity confirmed: verbally with patient Time out: Immediately prior to procedure a "time out" was called to verify the correct patient, procedure, equipment, support staff and site/side marked as required. Preparation: Patient was prepped and draped in the usual sterile fashion. Local anesthesia used: yes Anesthesia: digital block  Anesthesia: Local anesthesia used: yes Local Anesthetic: lidocaine 1% without epinephrine Anesthetic total: 8 mL  Sedation: Patient sedated: no  Patient tolerance: patient tolerated the procedure well with no immediate complications Comments: I&D with 16 blade of the left middle finger, purulent discharge    (including critical care time)  Medications Ordered in ED Medications - No data to display  ED Course  I have reviewed the triage vital signs and the nursing notes.  Pertinent labs & imaging results that were  available during my care of the patient were reviewed by me and considered in my medical decision making (see chart for details).    MDM Rules/Calculators/A&P                      Patient here with paronychia. CBG is 259 and EMR suggests patient was previously diagnosed with DM and is untreated. I have discussed the fact that multiple skin infections and the high blood sugar is very suggestive of a dx of DM, and that it is very important he follow closely with his pcp to get started on medication. I will cover with ABX and first dose given  here. Discussed wound care and return precautions given. Patient appears appropriate for dc at this time. Final Clinical Impression(s) / ED Diagnoses Final diagnoses:  None    Rx / DC Orders ED Discharge Orders    None       Margarita Mail, PA-C 03/30/19 1256    Wyvonnia Dusky, MD 03/31/19 [place], [place], PA-C 04/15/19 1941    Wyvonnia Dusky, MD 04/16/19 [id number]

## 2019-03-29 NOTE — ED Notes (Signed)
ED Provider at bedside. 

## 2019-03-29 NOTE — ED Triage Notes (Signed)
Pt with multiple complaints. Pt c/o paronychia to L middle finger. Pt also c/o mass to his L groin with tenderness.

## 2019-03-29 NOTE — Discharge Instructions (Signed)
Contact a health care provider if: °Your symptoms get worse or do not improve with treatment. °You have continued or increased fluid, blood, or pus coming from the affected area. °Your finger or knuckle becomes swollen or difficult to move. °Get help right away if you have: °A fever or chills. °Redness spreading away from the affected area. °Joint or muscle pain. °

## 2019-03-29 NOTE — ED Notes (Signed)
cbg 259, reorted to EDP Abigail

## 2019-06-23 ENCOUNTER — Encounter (HOSPITAL_BASED_OUTPATIENT_CLINIC_OR_DEPARTMENT_OTHER): Payer: Self-pay | Admitting: Emergency Medicine

## 2019-06-23 ENCOUNTER — Emergency Department (HOSPITAL_BASED_OUTPATIENT_CLINIC_OR_DEPARTMENT_OTHER): Payer: PRIVATE HEALTH INSURANCE

## 2019-06-23 ENCOUNTER — Other Ambulatory Visit: Payer: Self-pay

## 2019-06-23 ENCOUNTER — Emergency Department (HOSPITAL_BASED_OUTPATIENT_CLINIC_OR_DEPARTMENT_OTHER)
Admission: EM | Admit: 2019-06-23 | Discharge: 2019-06-24 | Disposition: A | Discharge status: Home / Self Care | Payer: PRIVATE HEALTH INSURANCE | Attending: Emergency Medicine | Admitting: Emergency Medicine

## 2019-06-23 DIAGNOSIS — Y9301 Activity, walking, marching and hiking: Secondary | ICD-10-CM | POA: Insufficient documentation

## 2019-06-23 DIAGNOSIS — Y999 Unspecified external cause status: Secondary | ICD-10-CM | POA: Insufficient documentation

## 2019-06-23 DIAGNOSIS — M205X2 Other deformities of toe(s) (acquired), left foot: Secondary | ICD-10-CM

## 2019-06-23 DIAGNOSIS — S91112A Laceration without foreign body of left great toe without damage to nail, initial encounter: Secondary | ICD-10-CM | POA: Diagnosis not present

## 2019-06-23 DIAGNOSIS — Z7984 Long term (current) use of oral hypoglycemic drugs: Secondary | ICD-10-CM | POA: Diagnosis not present

## 2019-06-23 DIAGNOSIS — Y929 Unspecified place or not applicable: Secondary | ICD-10-CM | POA: Diagnosis not present

## 2019-06-23 DIAGNOSIS — E119 Type 2 diabetes mellitus without complications: Secondary | ICD-10-CM | POA: Diagnosis not present

## 2019-06-23 DIAGNOSIS — W231XXA Caught, crushed, jammed, or pinched between stationary objects, initial encounter: Secondary | ICD-10-CM | POA: Diagnosis not present

## 2019-06-23 DIAGNOSIS — S99922A Unspecified injury of left foot, initial encounter: Secondary | ICD-10-CM | POA: Diagnosis present

## 2019-06-23 MED ORDER — OXYCODONE-ACETAMINOPHEN 5-325 MG PO TABS
1.0000 | ORAL_TABLET | Freq: Once | ORAL | Status: AC
  Administered 2019-06-23: 1 via ORAL
  Filled 2019-06-23: qty 1

## 2019-06-23 NOTE — ED Notes (Signed)
Irrigated Left Great toe. Nail intact. No bleeding.

## 2019-06-23 NOTE — ED Triage Notes (Signed)
Pt states he was walking tripped and hit his left great toe  Pt is c/o toe pain   Pt is already in a walking boot because he had surgery on his ankle with screw placement

## 2019-06-24 MED ORDER — BACITRACIN ZINC 500 UNIT/GM EX OINT
TOPICAL_OINTMENT | Freq: Two times a day (BID) | CUTANEOUS | Status: DC
  Administered 2019-06-24: 1 via TOPICAL
  Filled 2019-06-24: qty 28.35

## 2019-06-24 MED ORDER — BACITRACIN ZINC 500 UNIT/GM EX OINT: 1.0000 "application " | TOPICAL_OINTMENT | Freq: Two times a day (BID) | CUTANEOUS | 0 refills | Status: DC

## 2019-06-24 NOTE — Discharge Instructions (Addendum)
We signed the ER for toe injury. Our imaging does not reveal a clear fracture.  It is possible that you have a small fracture that is not being picked up on the x-ray.  Please try to offload the toe as much as possible and walking.  Continue to use the boot.  Ice the toe 3 or 4 times a day for 10 minutes.  You also have small cut at the tip of your toe.  Keep the area clean and dry.  Apply dressing twice a day.  If you start seeing any signs of infection, return to the ER immediately.  Follow-up with your PCP in 2 weeks if the pain persist. Continue taking Tylenol 650 mg and Advil for pain.

## 2019-06-24 NOTE — ED Provider Notes (Signed)
[facility] EMERGENCY DEPARTMENT Provider Note   CSN: [id number] Arrival date & time: 06/23/19  2222     History Chief Complaint  Patient presents with  . Toe Pain    Cody [name] is a 49 y.o. male.  HPI     49 year old male comes in a chief complaint of toe pain.  Patient reports that he was walking, and jammed his toe onto concrete.  He started having immediate pain and there was some blood loss.  He is wearing a cam walker boot on that side because of ankle injury.  Patient is diabetic.  He complains of pain with bearing weight to his toe.  Past Medical History:  Diagnosis Date  . Acid reflux   . Hypertension   . Mixed hyperlipidemia   . Sleep apnea     Patient Active Problem List   Diagnosis Date Noted  . New onset type 2 diabetes mellitus ([place]) 12/06/2015  . Essential hypertension 12/05/2015  . GERD (gastroesophageal reflux disease) 12/05/2015  . Cellulitis of leg, right 12/04/2015  . AKI (acute kidney injury) ([place]) 12/04/2015  . Thrombocytopenia ([place]) 12/04/2015    Past Surgical History:  Procedure Laterality Date  . FOOT ARTHRODESIS Left 09/25/2018   Procedure: Left subtalar joint arthrodesis and peroneal tendon repair;  Surgeon: Wylene Simmer, MD;  Location: [facility];  Service: Orthopedics;  Laterality: Left;  . KNEE SURGERY         Family History  Problem Relation Age of Onset  . Hypertension Other     Social History   Tobacco Use  . Smoking status: Former Research scientist (life sciences)  . Smokeless tobacco: Never Used  Substance Use Topics  . Alcohol use: No  . Drug use: No    Home Medications Prior to Admission medications   Medication Sig Start Date End Date Taking? Authorizing Provider  amLODipine (NORVASC) 10 MG tablet amlodipine 10 mg tablet  TAKE ONE TABLET BY MOUTH ONE TIME DAILY 04/04/18   [provider]  amoxicillin-clavulanate (AUGMENTIN) 875-125 MG tablet Take 1 tablet by mouth 2 (two) times daily. One po bid x 7 days  03/29/19   Margarita Mail, PA-C  aspirin EC 81 MG tablet Take 1 tablet (81 mg total) by mouth 2 (two) times daily. 09/25/18   Corky Sing, PA-C  atorvastatin (LIPITOR) 40 MG tablet atorvastatin 40 mg tablet  TAKE ONE TABLET BY MOUTH ONE TIME DAILY 05/30/18   [provider]  bacitracin ointment Apply 1 application topically 2 (two) times daily. 06/24/19   Varney Biles, MD  chlorthalidone (HYGROTON) 25 MG tablet Take 25 mg by mouth daily.    [provider]  docusate sodium (COLACE) 100 MG capsule Take 1 capsule (100 mg total) by mouth 2 (two) times daily. While taking narcotic pain medicine. 09/25/18   Corky Sing, PA-C  lisinopril (ZESTRIL) 40 MG tablet Take 40 mg by mouth daily.    [provider]  naproxen (NAPROSYN) 500 MG tablet naproxen 500 mg tablet  TAKE ONE TABLET BY MOUTH TWICE A DAY    [provider]  ondansetron (ZOFRAN ODT) 4 MG disintegrating tablet Take 1 tablet (4 mg total) by mouth every 8 (eight) hours as needed for nausea or vomiting. 08/30/18   Albrizze, Kaitlyn E, PA-C  senna (SENOKOT) 8.6 MG TABS tablet Take 2 tablets (17.2 mg total) by mouth 2 (two) times daily. 09/25/18   Corky Sing, PA-C    Allergies    Patient has no known  allergies.  Review of Systems   Review of Systems  Constitutional: Positive for activity change.  Skin: Positive for wound.  Allergic/Immunologic: Negative for immunocompromised state.  Hematological: Does not bruise/bleed easily.    Physical Exam Updated Vital Signs BP 132/80 (BP Location: Right Arm)   Pulse 76   Temp 98.8 F (37.1 C) (Oral)   Resp 20   Ht 6\' 2"  (1.88 m)   Wt (!) 141.1 kg   SpO2 99%   BMI 39.93 kg/m   Physical Exam Vitals and nursing note reviewed.  Constitutional:      Appearance: He is well-developed.  HENT:     Head: Atraumatic.  Cardiovascular:     Rate and Rhythm: Normal rate.  Pulmonary:     Effort: Pulmonary effort is normal.  Musculoskeletal:      Cervical back: Neck supple.     Comments: Patient's left great toe is slightly edematous.  There is tenderness to palpation over the toe itself.  No tenderness over the foot.  There is no gross deformity.  Patient does have a small superficial laceration just below the nail at the distal tip of his great toe.  The nail itself is intact.  Skin:    General: Skin is warm.  Neurological:     Mental Status: He is alert and oriented to person, place, and time.     ED Results / Procedures / Treatments   Labs (all labs ordered are listed, but only abnormal results are displayed) Labs Reviewed - No data to display  EKG None  Radiology DG Toe Great Left  Result Date: 06/23/2019 CLINICAL DATA:  Toe pain EXAM: LEFT GREAT TOE COMPARISON:  None. FINDINGS: No fracture or malalignment. Soft tissue swelling is present. Joint spaces are maintained IMPRESSION: No acute osseous abnormality Electronically Signed   By: 06/25/2019 M.D.   On: 06/23/2019 23:34    Procedures Procedures (including critical care time)  Medications Ordered in ED Medications  bacitracin ointment (1 application Topical Given 06/24/19 0042)  oxyCODONE-acetaminophen (PERCOCET/ROXICET) 5-325 MG per tablet 1 tablet (1 tablet Oral Given 06/23/19 2333)    ED Course  I have reviewed the triage vital signs and the nursing notes.  Pertinent labs & imaging results that were available during my care of the patient were reviewed by me and considered in my medical decision making (see chart for details).    MDM Rules/Calculators/A&P                      Patient comes in a chief complaint of toe injury.  X-ray of the toe does not reveal any acute fracture.  There could be a hairline fracture.  Patient is already using a cam walker boot, therefore no different strategy is required.  He has a small, superficial laceration just below the nail.  He does not need stitches.  We will cleanse the wound and apply antibiotic ointment.  Patient  will be sent home with strict ER return precautions infection and provided with bacitracin.  Final Clinical Impression(s) / ED Diagnoses Final diagnoses:  Toe contracture, left    Rx / DC Orders ED Discharge Orders         Ordered    bacitracin ointment  2 times daily     06/24/19 06/26/19, MD 06/24/19 [phone]

## 2019-07-15 ENCOUNTER — Encounter (HOSPITAL_BASED_OUTPATIENT_CLINIC_OR_DEPARTMENT_OTHER): Payer: Self-pay

## 2019-07-15 ENCOUNTER — Other Ambulatory Visit: Payer: Self-pay

## 2019-07-15 ENCOUNTER — Emergency Department (HOSPITAL_BASED_OUTPATIENT_CLINIC_OR_DEPARTMENT_OTHER): Payer: PRIVATE HEALTH INSURANCE

## 2019-07-15 ENCOUNTER — Emergency Department (HOSPITAL_BASED_OUTPATIENT_CLINIC_OR_DEPARTMENT_OTHER)
Admission: EM | Admit: 2019-07-15 | Discharge: 2019-07-15 | Disposition: A | Discharge status: Home / Self Care | Payer: PRIVATE HEALTH INSURANCE | Attending: Emergency Medicine | Admitting: Emergency Medicine

## 2019-07-15 DIAGNOSIS — Z7982 Long term (current) use of aspirin: Secondary | ICD-10-CM | POA: Insufficient documentation

## 2019-07-15 DIAGNOSIS — Z87891 Personal history of nicotine dependence: Secondary | ICD-10-CM | POA: Diagnosis not present

## 2019-07-15 DIAGNOSIS — E119 Type 2 diabetes mellitus without complications: Secondary | ICD-10-CM | POA: Diagnosis not present

## 2019-07-15 DIAGNOSIS — Y999 Unspecified external cause status: Secondary | ICD-10-CM | POA: Diagnosis not present

## 2019-07-15 DIAGNOSIS — Y9289 Other specified places as the place of occurrence of the external cause: Secondary | ICD-10-CM | POA: Insufficient documentation

## 2019-07-15 DIAGNOSIS — Y9389 Activity, other specified: Secondary | ICD-10-CM | POA: Diagnosis not present

## 2019-07-15 DIAGNOSIS — W228XXA Striking against or struck by other objects, initial encounter: Secondary | ICD-10-CM | POA: Insufficient documentation

## 2019-07-15 DIAGNOSIS — L03116 Cellulitis of left lower limb: Secondary | ICD-10-CM | POA: Insufficient documentation

## 2019-07-15 DIAGNOSIS — S99922A Unspecified injury of left foot, initial encounter: Secondary | ICD-10-CM | POA: Diagnosis present

## 2019-07-15 DIAGNOSIS — I10 Essential (primary) hypertension: Secondary | ICD-10-CM | POA: Insufficient documentation

## 2019-07-15 DIAGNOSIS — Z79899 Other long term (current) drug therapy: Secondary | ICD-10-CM | POA: Insufficient documentation

## 2019-07-15 DIAGNOSIS — S92425A Nondisplaced fracture of distal phalanx of left great toe, initial encounter for closed fracture: Secondary | ICD-10-CM

## 2019-07-15 LAB — CBC WITH DIFFERENTIAL/PLATELET
Abs Immature Granulocytes: 0.08 10*3/uL — ABNORMAL HIGH (ref 0.00–0.07)
Basophils Absolute: 0 10*3/uL (ref 0.0–0.1)
Basophils Relative: 0 %
Eosinophils Absolute: 0 10*3/uL (ref 0.0–0.5)
Eosinophils Relative: 0 %
HCT: 41.8 % (ref 39.0–52.0)
Hemoglobin: 14.1 g/dL (ref 13.0–17.0)
Immature Granulocytes: 1 %
Lymphocytes Relative: 4 %
Lymphs Abs: 0.5 10*3/uL — ABNORMAL LOW (ref 0.7–4.0)
MCH: 32.2 pg (ref 26.0–34.0)
MCHC: 33.7 g/dL (ref 30.0–36.0)
MCV: 95.4 fL (ref 80.0–100.0)
Monocytes Absolute: 0.3 10*3/uL (ref 0.1–1.0)
Monocytes Relative: 3 %
Neutro Abs: 10.2 10*3/uL — ABNORMAL HIGH (ref 1.7–7.7)
Neutrophils Relative %: 92 %
Platelets: 72 10*3/uL — ABNORMAL LOW (ref 150–400)
RBC: 4.38 MIL/uL (ref 4.22–5.81)
RDW: 13 % (ref 11.5–15.5)
Smear Review: DECREASED
WBC: 11.1 10*3/uL — ABNORMAL HIGH (ref 4.0–10.5)
nRBC: 0 % (ref 0.0–0.2)

## 2019-07-15 LAB — BASIC METABOLIC PANEL
Anion gap: 10 (ref 5–15)
BUN: 18 mg/dL (ref 6–20)
CO2: 24 mmol/L (ref 22–32)
Calcium: 9.3 mg/dL (ref 8.9–10.3)
Chloride: 101 mmol/L (ref 98–111)
Creatinine, Ser: 1.12 mg/dL (ref 0.61–1.24)
GFR calc Af Amer: >60 mL/min (ref >60)
GFR calc non Af Amer: >60 mL/min (ref >60)
Glucose, Bld: 122 mg/dL — ABNORMAL HIGH (ref 70–99)
Potassium: 3.9 mmol/L (ref 3.5–5.1)
Sodium: 135 mmol/L (ref 135–145)

## 2019-07-15 MED ORDER — DOXYCYCLINE HYCLATE 100 MG PO TABS
100.0000 mg | ORAL_TABLET | Freq: Once | ORAL | Status: AC
  Administered 2019-07-15: 100 mg via ORAL
  Filled 2019-07-15: qty 1

## 2019-07-15 MED ORDER — OXYCODONE-ACETAMINOPHEN 5-325 MG PO TABS
1.0000 | ORAL_TABLET | Freq: Once | ORAL | Status: AC
  Administered 2019-07-15: 1 via ORAL
  Filled 2019-07-15: qty 1

## 2019-07-15 MED ORDER — DOXYCYCLINE HYCLATE 100 MG PO CAPS: 100.0000 mg | ORAL_CAPSULE | Freq: Two times a day (BID) | ORAL | 0 refills | Status: DC

## 2019-07-15 NOTE — ED Triage Notes (Signed)
Pt c/o injury to right great toe 2 weeks ago-states he was seen here for same-pt wearing a cam walker to left LE "since 2020"-NAD-steady gait with cam walker

## 2019-07-15 NOTE — Discharge Instructions (Addendum)
Take antibiotic as directed. Follow up with orthopedics as scheduled. Follow-up with your primary care provider to evaluate success of antibiotic treatment for the cellulitis.

## 2019-07-15 NOTE — ED Provider Notes (Signed)
[facility] EMERGENCY DEPARTMENT Provider Note   CSN: [id number] Arrival date & time: 07/15/19  1654     History Chief Complaint  Patient presents with  . Toe Injury    Cody [name] is a 49 y.o. male.  Patient presents to ED with complaint of left great toe pain with radiation to lower leg. He stumped his toe about three weeks ago. He has been applying bacitracin ointment to the toe. Today he broke out in a sweat when toe began to hurt. No history of gout. Patient is a diabetic. He is scheduled for orthopedic follow-up on Jul 27, 2019.  The history is provided by the patient and medical records.  Foot Pain This is a recurrent problem. The problem has been gradually worsening.       Past Medical History:  Diagnosis Date  . Acid reflux   . Hypertension   . Mixed hyperlipidemia   . Sleep apnea     Patient Active Problem List   Diagnosis Date Noted  . New onset type 2 diabetes mellitus ([facility]) 12/06/2015  . Essential hypertension 12/05/2015  . GERD (gastroesophageal reflux disease) 12/05/2015  . Cellulitis of leg, right 12/04/2015  . AKI (acute kidney injury) ([facility]) 12/04/2015  . Thrombocytopenia ([facility]) 12/04/2015    Past Surgical History:  Procedure Laterality Date  . FOOT ARTHRODESIS Left 09/25/2018   Procedure: Left subtalar joint arthrodesis and peroneal tendon repair;  Surgeon: Toni Arthurs, MD;  Location: [facility];  Service: Orthopedics;  Laterality: Left;  . KNEE SURGERY         Family History  Problem Relation Age of Onset  . Hypertension Other     Social History   Tobacco Use  . Smoking status: Former Games developer  . Smokeless tobacco: Never Used  Substance Use Topics  . Alcohol use: No  . Drug use: No    Home Medications Prior to Admission medications   Medication Sig Start Date End Date Taking? Authorizing Provider  amLODipine (NORVASC) 10 MG tablet amlodipine 10 mg tablet  TAKE ONE TABLET BY MOUTH ONE TIME DAILY 04/04/18    [provider]  amoxicillin-clavulanate (AUGMENTIN) 875-125 MG tablet Take 1 tablet by mouth 2 (two) times daily. One po bid x 7 days 03/29/19   Arthor Captain, PA-C  aspirin EC 81 MG tablet Take 1 tablet (81 mg total) by mouth 2 (two) times daily. 09/25/18   Jacinta Shoe, PA-C  atorvastatin (LIPITOR) 40 MG tablet atorvastatin 40 mg tablet  TAKE ONE TABLET BY MOUTH ONE TIME DAILY 05/30/18   [provider]  bacitracin ointment Apply 1 application topically 2 (two) times daily. 06/24/19   Derwood Kaplan, MD  chlorthalidone (HYGROTON) 25 MG tablet Take 25 mg by mouth daily.    [provider]  docusate sodium (COLACE) 100 MG capsule Take 1 capsule (100 mg total) by mouth 2 (two) times daily. While taking narcotic pain medicine. 09/25/18   Jacinta Shoe, PA-C  lisinopril (ZESTRIL) 40 MG tablet Take 40 mg by mouth daily.    [provider]  naproxen (NAPROSYN) 500 MG tablet naproxen 500 mg tablet  TAKE ONE TABLET BY MOUTH TWICE A DAY    [provider]  ondansetron (ZOFRAN ODT) 4 MG disintegrating tablet Take 1 tablet (4 mg total) by mouth every 8 (eight) hours as needed for nausea or vomiting. 08/30/18   Albrizze, Kaitlyn E, PA-C  senna (SENOKOT) 8.6 MG TABS tablet Take 2 tablets (17.2 mg total) by  mouth 2 (two) times daily. 09/25/18   Jacinta Shoe, PA-C    Allergies    Patient has no known allergies.  Review of Systems   Review of Systems  Musculoskeletal: Positive for arthralgias and gait problem.  Skin: Positive for color change.  All other systems reviewed and are negative.   Physical Exam Updated Vital Signs BP (!) 141/97 (BP Location: Left Arm)   Pulse 80   Temp 99.1 F (37.3 C) (Oral)   Ht 6\' 2"  (1.88 m)   Wt (!) 141.1 kg   BMI 39.93 kg/m   Physical Exam Vitals and nursing note reviewed.  Constitutional:      Appearance: He is not ill-appearing.  HENT:     Head: Atraumatic.     Mouth/Throat:     Mouth: Mucous membranes  are moist.  Eyes:     Conjunctiva/sclera: Conjunctivae normal.  Cardiovascular:     Rate and Rhythm: Normal rate.  Pulmonary:     Effort: Pulmonary effort is normal.  Abdominal:     Palpations: Abdomen is soft.  Musculoskeletal:        General: Tenderness present.     Left foot: Swelling present. No deformity.     Comments: Patient with mild swelling and erythema of left foot and lower leg. Increased warmth.  Skin:    General: Skin is warm and dry.  Neurological:     Mental Status: He is alert.     ED Results / Procedures / Treatments   Labs (all labs ordered are listed, but only abnormal results are displayed) Labs Reviewed  CBC WITH DIFFERENTIAL/PLATELET  BASIC METABOLIC PANEL    EKG None  Radiology DG Foot Complete Left  Result Date: 07/15/2019 CLINICAL DATA:  Left foot pain EXAM: LEFT FOOT - COMPLETE 3+ VIEW COMPARISON:  06/23/2019 FINDINGS: Frontal, oblique, and lateral views of the left foot are obtained. There is a minimally displaced intra-articular fracture medial aspect base of the first distal phalanx. No other acute bony abnormalities. Postsurgical changes are seen from talocalcaneal fusion. Soft tissues are unremarkable. IMPRESSION: 1. Acute intra-articular fracture at the base of the first distal phalanx. Electronically Signed   By: 06/25/2019 M.D.   On: 07/15/2019 21:28    Procedures Procedures (including critical care time)  Medications Ordered in ED Medications - No data to display  ED Course  I have reviewed the triage vital signs and the nursing notes.  Pertinent labs & imaging results that were available during my care of the patient were reviewed by me and considered in my medical decision making (see chart for details).    MDM Rules/Calculators/A&P                     Patient X-Ray positive for great toe fracture  Pt advised to follow up with orthopedics. Patient has a walking boot, and is scheduled for orthopedic follow-up on May 3.  Patient will be discharged home & is agreeable with above plan. Returns precautions discussed. Pt appears safe for discharge. Patient presentation consistent with cellulitis. Afebrile. No tachycardia, hypotension or other symptoms suggestive of severe infection. Pt advised to follow up for worsening systemic symptoms, new lymphangitis,  Will discharge with doxycycline. Return precautions discussed. Pt appears safe for discharge.  Final Clinical Impression(s) / ED Diagnoses Final diagnoses:  Cellulitis of left lower leg  Closed nondisplaced fracture of distal phalanx of left great toe, initial encounter    Rx / DC Orders ED Discharge Orders  Ordered    doxycycline (VIBRAMYCIN) 100 MG capsule  2 times daily     07/15/19 2236           Etta Quill, NP 07/15/19 6578    Charlesetta Shanks, MD 07/20/19 2356

## 2019-07-15 NOTE — ED Notes (Signed)
Recurrent left big toe pain that radiated up calf, has been seen for same. States he has put prescribed ointment on it as told with no relief. Blood/drainage noted under nail bed of left great toe. Little swelling/edema noted. Denies hx of gout, endorses DM dx.

## 2019-07-30 ENCOUNTER — Encounter (HOSPITAL_BASED_OUTPATIENT_CLINIC_OR_DEPARTMENT_OTHER): Payer: Self-pay

## 2019-07-30 ENCOUNTER — Emergency Department (HOSPITAL_BASED_OUTPATIENT_CLINIC_OR_DEPARTMENT_OTHER): Payer: PRIVATE HEALTH INSURANCE | Attending: Emergency Medicine

## 2019-07-30 ENCOUNTER — Emergency Department (HOSPITAL_BASED_OUTPATIENT_CLINIC_OR_DEPARTMENT_OTHER): Payer: PRIVATE HEALTH INSURANCE

## 2019-07-30 ENCOUNTER — Emergency Department (HOSPITAL_BASED_OUTPATIENT_CLINIC_OR_DEPARTMENT_OTHER)
Admission: EM | Admit: 2019-07-30 | Discharge: 2019-07-30 | Disposition: A | Discharge status: Home / Self Care | Payer: No Typology Code available for payment source | Attending: Emergency Medicine | Admitting: Emergency Medicine

## 2019-07-30 ENCOUNTER — Other Ambulatory Visit: Payer: Self-pay

## 2019-07-30 DIAGNOSIS — E119 Type 2 diabetes mellitus without complications: Secondary | ICD-10-CM | POA: Diagnosis not present

## 2019-07-30 DIAGNOSIS — Z87891 Personal history of nicotine dependence: Secondary | ICD-10-CM | POA: Diagnosis not present

## 2019-07-30 DIAGNOSIS — M79605 Pain in left leg: Secondary | ICD-10-CM | POA: Insufficient documentation

## 2019-07-30 DIAGNOSIS — Z79899 Other long term (current) drug therapy: Secondary | ICD-10-CM | POA: Diagnosis not present

## 2019-07-30 DIAGNOSIS — M79662 Pain in left lower leg: Secondary | ICD-10-CM

## 2019-07-30 DIAGNOSIS — E782 Mixed hyperlipidemia: Secondary | ICD-10-CM | POA: Diagnosis not present

## 2019-07-30 DIAGNOSIS — Z7982 Long term (current) use of aspirin: Secondary | ICD-10-CM | POA: Insufficient documentation

## 2019-07-30 DIAGNOSIS — I10 Essential (primary) hypertension: Secondary | ICD-10-CM | POA: Diagnosis not present

## 2019-07-30 DIAGNOSIS — Z7984 Long term (current) use of oral hypoglycemic drugs: Secondary | ICD-10-CM | POA: Insufficient documentation

## 2019-07-30 NOTE — ED Triage Notes (Signed)
Pt states sent by therapist due to burning sensation inner lower left leg to r/o clot.  Arrives with cam walker in place recent orthopedic surgery.  States pain begain 2-3 weeks ago following toe injury.

## 2019-07-30 NOTE — ED Provider Notes (Signed)
[facility] EMERGENCY DEPARTMENT Provider Note   CSN: [id number] Arrival date & time: 07/30/19  1815     History Chief Complaint  Patient presents with  . Leg Pain    Cody [name] is a 49 y.o. male history obesity, hypertension, sleep apnea, diabetes, GERD.  Patient presents today for concern of DVT.  He reports that he broke his left great toe last month, he has been using cam walker and cane since that time to ambulate.  He saw his physical therapist today who was concerned for possible DVT as patient was having left calf pain and symptoms this ER for evaluation.  Patient describes around 2 weeks of left calf pain, a mild burning sensation worse with ambulation improved with rest and elevation, nonradiating.  He denies any new falls, injuries, denies any visible swelling or color change.  Additionally he denies any fever/chills, chest pain/shortness of breath, headache, abdominal pain, nausea/vomiting, numbness/weakness, tingling or any additional concerns.  HPI     Past Medical History:  Diagnosis Date  . Acid reflux   . Hypertension   . Mixed hyperlipidemia   . Sleep apnea     Patient Active Problem List   Diagnosis Date Noted  . New onset type 2 diabetes mellitus ([facility]) 12/06/2015  . Essential hypertension 12/05/2015  . GERD (gastroesophageal reflux disease) 12/05/2015  . Cellulitis of leg, right 12/04/2015  . AKI (acute kidney injury) ([facility]) 12/04/2015  . Thrombocytopenia ([facility]) 12/04/2015    Past Surgical History:  Procedure Laterality Date  . FOOT ARTHRODESIS Left 09/25/2018   Procedure: Left subtalar joint arthrodesis and peroneal tendon repair;  Surgeon: Toni Arthurs, MD;  Location: [facility];  Service: Orthopedics;  Laterality: Left;  . KNEE SURGERY         Family History  Problem Relation Age of Onset  . Hypertension Other     Social History   Tobacco Use  . Smoking status: Former Games developer  . Smokeless tobacco: Never Used    Substance Use Topics  . Alcohol use: No  . Drug use: No    Home Medications Prior to Admission medications   Medication Sig Start Date End Date Taking? Authorizing Provider  amLODipine (NORVASC) 10 MG tablet amlodipine 10 mg tablet  TAKE ONE TABLET BY MOUTH ONE TIME DAILY 04/04/18   [provider]  amoxicillin-clavulanate (AUGMENTIN) 875-125 MG tablet Take 1 tablet by mouth 2 (two) times daily. One po bid x 7 days 03/29/19   Arthor Captain, PA-C  aspirin EC 81 MG tablet Take 1 tablet (81 mg total) by mouth 2 (two) times daily. 09/25/18   Jacinta Shoe, PA-C  atorvastatin (LIPITOR) 40 MG tablet atorvastatin 40 mg tablet  TAKE ONE TABLET BY MOUTH ONE TIME DAILY 05/30/18   [provider]  bacitracin ointment Apply 1 application topically 2 (two) times daily. 06/24/19   Derwood Kaplan, MD  chlorthalidone (HYGROTON) 25 MG tablet Take 25 mg by mouth daily.    [provider]  docusate sodium (COLACE) 100 MG capsule Take 1 capsule (100 mg total) by mouth 2 (two) times daily. While taking narcotic pain medicine. 09/25/18   Jacinta Shoe, PA-C  doxycycline (VIBRAMYCIN) 100 MG capsule Take 1 capsule (100 mg total) by mouth 2 (two) times daily. One po bid x 7 days 07/15/19   Felicie Morn, NP  lisinopril (ZESTRIL) 40 MG tablet Take 40 mg by mouth daily.    [provider]  naproxen (NAPROSYN) 500 MG tablet naproxen  500 mg tablet  TAKE ONE TABLET BY MOUTH TWICE A DAY    [provider]  ondansetron (ZOFRAN ODT) 4 MG disintegrating tablet Take 1 tablet (4 mg total) by mouth every 8 (eight) hours as needed for nausea or vomiting. 08/30/18   Albrizze, Kaitlyn E, PA-C  senna (SENOKOT) 8.6 MG TABS tablet Take 2 tablets (17.2 mg total) by mouth 2 (two) times daily. 09/25/18   Corky Sing, PA-C    Allergies    Patient has no known allergies.  Review of Systems   Review of Systems Ten systems are reviewed and are negative for acute change except as noted  in the HPI  Physical Exam Updated Vital Signs BP (!) 156/110   Pulse 64   Resp 18   Ht 6\' 2"  (1.88 m)   Wt (!) 141.1 kg   SpO2 100%   BMI 39.93 kg/m   Physical Exam Constitutional:      General: He is not in acute distress.    Appearance: Normal appearance. He is well-developed. He is obese. He is not ill-appearing or diaphoretic.  HENT:     Head: Normocephalic and atraumatic.     Right Ear: External ear normal.     Left Ear: External ear normal.     Nose: Nose normal.  Eyes:     General: Vision grossly intact. Gaze aligned appropriately.     Pupils: Pupils are equal, round, and reactive to light.  Neck:     Trachea: Trachea and phonation normal. No tracheal deviation.  Cardiovascular:     Rate and Rhythm: Normal rate and regular rhythm.     Pulses:          Dorsalis pedis pulses are 2+ on the right side and 2+ on the left side.  Pulmonary:     Effort: Pulmonary effort is normal. No respiratory distress.  Abdominal:     General: There is no distension.     Palpations: Abdomen is soft.     Tenderness: There is no abdominal tenderness. There is no guarding or rebound.  Musculoskeletal:        General: Normal range of motion.     Cervical back: Normal range of motion.     Comments: Tenderness to palpation of the left calf without overlying skin change.  Compartments soft.  No fluctuance, induration, erythema or skin break.  Range of motion at the right knee and right ankle intact without pain.  No significant swelling or color change compared to the right side.  Capillary refill and sensation intact to all toes.  Strong equal pedal pulses.  Feet:     Right foot:     Protective Sensation: 5 sites tested. 5 sites sensed.     Skin integrity: Skin integrity normal.     Left foot:     Protective Sensation: 5 sites tested. 5 sites sensed.     Skin integrity: Skin integrity normal.  Skin:    General: Skin is warm and dry.  Neurological:     Mental Status: He is alert.      GCS: GCS eye subscore is 4. GCS verbal subscore is 5. GCS motor subscore is 6.     Comments: Speech is clear and goal oriented, follows commands Major Cranial nerves without deficit, no facial droop Moves extremities without ataxia, coordination intact  Psychiatric:        Behavior: Behavior normal.     ED Results / Procedures / Treatments   Labs (all labs  ordered are listed, but only abnormal results are displayed) Labs Reviewed - No data to display  EKG None  Radiology US Venous Img Lower Unilateral Left  Result Date: 07/30/2019 CLINICAL DATA:  Left leg burning sensations. EXAM: LEFT LOWER EXTREMITY VENOUS DOPPLER ULTRASOUND TECHNIQUE: Gray-scale sonography with compression, as well as color and duplex ultrasound, were performed to evaluate the deep venous system(s) from the level of the common femoral vein through the popliteal and proximal calf veins. COMPARISON:  None. FINDINGS: VENOUS Normal compressibility of the common femoral, superficial femoral, and popliteal veins, as well as the visualized calf veins. Visualized portions of profunda femoral vein and great saphenous vein unremarkable. No filling defects to suggest DVT on grayscale or color Doppler imaging. Doppler waveforms show normal direction of venous flow, normal respiratory plasticity and response to augmentation. Limited views of the contralateral common femoral vein are unremarkable. OTHER None. Limitations: Limited visualization of the calf veins due to soft tissue edema. IMPRESSION: No deep venous thrombosis seen. Electronically Signed   By: Beckie Salts M.D.   On: 07/30/2019 19:53    Procedures Procedures (including critical care time)  Medications Ordered in ED Medications - No data to display  ED Course  I have reviewed the triage vital signs and the nursing notes.  Pertinent labs & imaging results that were available during my care of the patient were reviewed by me and considered in my medical decision making  (see chart for details).    MDM Rules/Calculators/A&P                     49 year old male who reports he was sent in by his physical therapist for concern of left lower extremity DVT.  He has been having left calf pain for the past 2 weeks, he has been immobilized with a cam walker after a recent toe fracture in April.  He denies any significant swelling or color change of the area.  No numbness, weakness, pain with movement or new injuries.  On exam he is well-appearing no acute distress, he is neurovascular tact to both lower extremities without evidence of DVT on exam.  Compartments are soft.  DVT ultrasound left lower extremity was ordered in triage, and shows no DVT.  Suspect patient may have muscular strain of the left calf muscle today, will encourage continued [facility] therapy and follow-up with physical therapy and primary care provider.  No evidence of DVT, cellulitis, septic arthritis, compartment syndrome, gross ligamentous laxity, neurovascular compromise or any additional concerns.  Additionally patient's blood pressure elevated today.  He is asymptomatic regarding his hypertension.  I encourage patient take blood pressure medications as prescribed and follow-up with PCP within 1 week for blood pressure recheck and medication management.  Patient informed of signs/symptoms of hypertensive emergency/urgency and to return to the ER for occur.  At this time there does not appear to be any evidence of an acute emergency medical condition and the patient appears stable for discharge with appropriate outpatient follow up. Diagnosis was discussed with patient who verbalizes understanding of care plan and is agreeable to discharge. I have discussed return precautions with patient who verbalizes understanding. Patient encouraged to follow-up with their PCP. All questions answered.  Note: Portions of this report may have been transcribed using voice recognition software. Every effort was made to ensure  accuracy; however, inadvertent computerized transcription errors may still be present. Final Clinical Impression(s) / ED Diagnoses Final diagnoses:  Pain of left calf  Asymptomatic hypertension  Rx / DC Orders ED Discharge Orders    None       Elizabeth Palau 07/30/19 2054    Raeford Razor, MD 07/31/19 1816

## 2019-07-30 NOTE — Discharge Instructions (Addendum)
You have been diagnosed today with left calf pain and elevated blood pressure.  At this time there does not appear to be the presence of an emergent medical condition, however there is always the potential for conditions to change. Please read and follow the below instructions.  Please return to the Emergency Department immediately for any new or worsening symptoms. Please be sure to follow up with your Primary Care Provider within one week regarding your visit today; please call their office to schedule an appointment even if you are feeling better for a follow-up visit. Use rest, ice and elevation to help with pain of your leg. Additionally your blood pressure was high in the emergency department today.  Please take your blood pressure medication as prescribed and follow-up with your primary care provider within 1 week for blood pressure recheck and medication management. Get help right away if you: Get a headache. You have fever or chills Start to feel mixed up (confused). Feel weak or numb. Feel faint. Have very bad pain in your: Chest. Belly (abdomen). Throw up more than once. Have trouble breathing. You have any new/concerning or worsening of symptoms  Please read the additional information packets attached to your discharge summary.  Do not take your medicine if  develop an itchy rash, swelling in your mouth or lips, or difficulty breathing; call 911 and seek immediate emergency medical attention if this occurs.  Note: Portions of this text may have been transcribed using voice recognition software. Every effort was made to ensure accuracy; however, inadvertent computerized transcription errors may still be present.

## 2019-08-24 ENCOUNTER — Encounter (HOSPITAL_BASED_OUTPATIENT_CLINIC_OR_DEPARTMENT_OTHER): Payer: Self-pay | Admitting: *Deleted

## 2019-08-24 ENCOUNTER — Emergency Department (HOSPITAL_BASED_OUTPATIENT_CLINIC_OR_DEPARTMENT_OTHER)
Admission: EM | Admit: 2019-08-24 | Discharge: 2019-08-24 | Disposition: A | Discharge status: Home / Self Care | Payer: PRIVATE HEALTH INSURANCE | Attending: Emergency Medicine | Admitting: Emergency Medicine

## 2019-08-24 ENCOUNTER — Other Ambulatory Visit: Payer: Self-pay

## 2019-08-24 ENCOUNTER — Emergency Department (HOSPITAL_BASED_OUTPATIENT_CLINIC_OR_DEPARTMENT_OTHER): Payer: PRIVATE HEALTH INSURANCE

## 2019-08-24 DIAGNOSIS — M79675 Pain in left toe(s): Secondary | ICD-10-CM | POA: Insufficient documentation

## 2019-08-24 DIAGNOSIS — E119 Type 2 diabetes mellitus without complications: Secondary | ICD-10-CM | POA: Insufficient documentation

## 2019-08-24 DIAGNOSIS — Z87891 Personal history of nicotine dependence: Secondary | ICD-10-CM | POA: Insufficient documentation

## 2019-08-24 DIAGNOSIS — Z7982 Long term (current) use of aspirin: Secondary | ICD-10-CM | POA: Diagnosis not present

## 2019-08-24 DIAGNOSIS — Z79899 Other long term (current) drug therapy: Secondary | ICD-10-CM | POA: Insufficient documentation

## 2019-08-24 DIAGNOSIS — I10 Essential (primary) hypertension: Secondary | ICD-10-CM | POA: Insufficient documentation

## 2019-08-24 HISTORY — DX: Type 2 diabetes mellitus without complications: E11.9

## 2019-08-24 LAB — COMPREHENSIVE METABOLIC PANEL
ALT: 31 U/L (ref 0–44)
AST: 18 U/L (ref 15–41)
Albumin: 4.1 g/dL (ref 3.5–5.0)
Alkaline Phosphatase: 91 U/L (ref 38–126)
Anion gap: 7 (ref 5–15)
BUN: 32 mg/dL — ABNORMAL HIGH (ref 6–20)
CO2: 27 mmol/L (ref 22–32)
Calcium: 9.1 mg/dL (ref 8.9–10.3)
Chloride: 104 mmol/L (ref 98–111)
Creatinine, Ser: 1.54 mg/dL — ABNORMAL HIGH (ref 0.61–1.24)
GFR calc Af Amer: >60 mL/min (ref >60)
GFR calc non Af Amer: 52 mL/min — ABNORMAL LOW (ref >60)
Glucose, Bld: 244 mg/dL — ABNORMAL HIGH (ref 70–99)
Potassium: 4.6 mmol/L (ref 3.5–5.1)
Sodium: 138 mmol/L (ref 135–145)
Total Bilirubin: 0.7 mg/dL (ref 0.3–1.2)
Total Protein: 6.7 g/dL (ref 6.5–8.1)

## 2019-08-24 LAB — CBC WITH DIFFERENTIAL/PLATELET
Abs Immature Granulocytes: 0.09 10*3/uL — ABNORMAL HIGH (ref 0.00–0.07)
Basophils Absolute: 0 10*3/uL (ref 0.0–0.1)
Basophils Relative: 1 %
Eosinophils Absolute: 0.2 10*3/uL (ref 0.0–0.5)
Eosinophils Relative: 4 %
HCT: 35.7 % — ABNORMAL LOW (ref 39.0–52.0)
Hemoglobin: 12.3 g/dL — ABNORMAL LOW (ref 13.0–17.0)
Immature Granulocytes: 2 %
Lymphocytes Relative: 33 %
Lymphs Abs: 1.7 10*3/uL (ref 0.7–4.0)
MCH: 32.5 pg (ref 26.0–34.0)
MCHC: 34.5 g/dL (ref 30.0–36.0)
MCV: 94.2 fL (ref 80.0–100.0)
Monocytes Absolute: 0.4 10*3/uL (ref 0.1–1.0)
Monocytes Relative: 8 %
Neutro Abs: 2.8 10*3/uL (ref 1.7–7.7)
Neutrophils Relative %: 52 %
Platelets: 73 10*3/uL — ABNORMAL LOW (ref 150–400)
RBC: 3.79 MIL/uL — ABNORMAL LOW (ref 4.22–5.81)
RDW: 13.1 % (ref 11.5–15.5)
Smear Review: NORMAL
WBC Morphology: ABNORMAL
WBC: 5.3 10*3/uL (ref 4.0–10.5)
nRBC: 0 % (ref 0.0–0.2)

## 2019-08-24 MED ORDER — CEPHALEXIN 500 MG PO CAPS: 500.0000 mg | ORAL_CAPSULE | Freq: Four times a day (QID) | ORAL | 0 refills | Status: DC

## 2019-08-24 MED ORDER — HYDROCODONE-ACETAMINOPHEN 5-325 MG PO TABS
1.0000 | ORAL_TABLET | Freq: Once | ORAL | Status: AC
  Administered 2019-08-24: 1 via ORAL
  Filled 2019-08-24: qty 1

## 2019-08-24 NOTE — ED Triage Notes (Signed)
He broke his toe 07/20/19. Drainage from his left great toe. Hx of diabetes.

## 2019-08-24 NOTE — ED Notes (Signed)
To x-ray

## 2019-08-24 NOTE — ED Provider Notes (Signed)
[facility] EMERGENCY DEPARTMENT Provider Note   CSN: [id number] Arrival date & time: 08/24/19  1407     History Chief Complaint  Patient presents with  . Wound Check    Cody [name] is a 49 y.o. male.  49 y.o male with a PMH of DM, HTN presents to the ED with a chief complaint of left great toe pain x 1 week. Patient reports he was seen in the ED 3 weeks ago after a fracture to his left great toe.  He reports returning a second time where he was found that he had a small fracture on his left great toe.  He has had pain to the area, does have a podiatrist appointment on June 9.  He reports there has been increasing drainage from his toe below the nail, reports every time he ambulates or sharp pain to the left great toe.  He reports he is "borderline diabetic ", however his records indicate that he was diagnosed with diabetes type 2 in 2017.  Reports he currently does not check his sugars.  He has not been running any fevers, has not taken any medication for improvement in symptoms. No other injuries or complaints.  Of note, patient does currently have a cam walker from a broken ankle.   The history is provided by the patient.       Past Medical History:  Diagnosis Date  . Acid reflux   . Diabetes mellitus without complication (HCC)   . Hypertension   . Mixed hyperlipidemia   . Sleep apnea     Patient Active Problem List   Diagnosis Date Noted  . New onset type 2 diabetes mellitus (HCC) 12/06/2015  . Essential hypertension 12/05/2015  . GERD (gastroesophageal reflux disease) 12/05/2015  . Cellulitis of leg, right 12/04/2015  . AKI (acute kidney injury) (HCC) 12/04/2015  . Thrombocytopenia (HCC) 12/04/2015    Past Surgical History:  Procedure Laterality Date  . FOOT ARTHRODESIS Left 09/25/2018   Procedure: Left subtalar joint arthrodesis and peroneal tendon repair;  Surgeon: Toni Arthurs, MD;  Location: [facility];  Service: Orthopedics;  Laterality:  Left;  . KNEE SURGERY         Family History  Problem Relation Age of Onset  . Hypertension Other     Social History   Tobacco Use  . Smoking status: Former Games developer  . Smokeless tobacco: Never Used  Substance Use Topics  . Alcohol use: No  . Drug use: No    Home Medications Prior to Admission medications   Medication Sig Start Date End Date Taking? Authorizing Provider  amLODipine (NORVASC) 10 MG tablet amlodipine 10 mg tablet  TAKE ONE TABLET BY MOUTH ONE TIME DAILY 04/04/18   [provider]  amoxicillin-clavulanate (AUGMENTIN) 875-125 MG tablet Take 1 tablet by mouth 2 (two) times daily. One po bid x 7 days 03/29/19   Arthor Captain, PA-C  aspirin EC 81 MG tablet Take 1 tablet (81 mg total) by mouth 2 (two) times daily. 09/25/18   Jacinta Shoe, PA-C  atorvastatin (LIPITOR) 40 MG tablet atorvastatin 40 mg tablet  TAKE ONE TABLET BY MOUTH ONE TIME DAILY 05/30/18   [provider]  bacitracin ointment Apply 1 application topically 2 (two) times daily. 06/24/19   Derwood Kaplan, MD  cephALEXin (KEFLEX) 500 MG capsule Take 1 capsule (500 mg total) by mouth 4 (four) times daily for 7 days. 08/24/19 08/31/19  Claude Manges, PA-C  chlorthalidone (HYGROTON) 25 MG tablet Take  25 mg by mouth daily.    [provider]  docusate sodium (COLACE) 100 MG capsule Take 1 capsule (100 mg total) by mouth 2 (two) times daily. While taking narcotic pain medicine. 09/25/18   Jacinta Shoe, PA-C  doxycycline (VIBRAMYCIN) 100 MG capsule Take 1 capsule (100 mg total) by mouth 2 (two) times daily. One po bid x 7 days 07/15/19   Felicie Morn, NP  lisinopril (ZESTRIL) 40 MG tablet Take 40 mg by mouth daily.    [provider]  naproxen (NAPROSYN) 500 MG tablet naproxen 500 mg tablet  TAKE ONE TABLET BY MOUTH TWICE A DAY    [provider]  ondansetron (ZOFRAN ODT) 4 MG disintegrating tablet Take 1 tablet (4 mg total) by mouth every 8 (eight) hours as needed for  nausea or vomiting. 08/30/18   Albrizze, Kaitlyn E, PA-C  senna (SENOKOT) 8.6 MG TABS tablet Take 2 tablets (17.2 mg total) by mouth 2 (two) times daily. 09/25/18   Jacinta Shoe, PA-C    Allergies    Patient has no known allergies.  Review of Systems   Review of Systems  Constitutional: Negative for chills and fever.  Respiratory: Negative for shortness of breath.   Cardiovascular: Negative for chest pain.  Musculoskeletal: Positive for arthralgias. Negative for myalgias.  All other systems reviewed and are negative.   Physical Exam Updated Vital Signs BP (!) 161/97   Pulse 76   Temp 98.9 F (37.2 C) (Oral)   Resp 17   Ht 6\' 2"  (1.88 m)   Wt (!) 141.1 kg   SpO2 100%   BMI 39.94 kg/m   Physical Exam Vitals and nursing note reviewed.  Constitutional:      Appearance: Normal appearance. He is not ill-appearing.     Comments: Non ill, non toxic appearing.  HENT:     Head: Normocephalic and atraumatic.     Nose: Nose normal.     Mouth/Throat:     Mouth: Mucous membranes are moist.  Eyes:     Pupils: Pupils are equal, round, and reactive to light.  Cardiovascular:     Rate and Rhythm: Normal rate.     Pulses:          Dorsalis pedis pulses are 2+ on the left side.       Posterior tibial pulses are 2+ on the left side.  Pulmonary:     Effort: Pulmonary effort is normal.  Abdominal:     General: Abdomen is flat.  Musculoskeletal:     Cervical back: Normal range of motion and neck supple.  Feet:     Left foot:     Skin integrity: Skin integrity normal. No erythema, warmth or callus.     Comments: Clear drainage noted below the nail. No erythema, no edema, no changes in the skin. Pulses present, sensation is intact.  Skin:    General: Skin is warm and dry.  Neurological:     Mental Status: He is alert and oriented to person, place, and time.     ED Results / Procedures / Treatments   Labs (all labs ordered are listed, but only abnormal results are  displayed) Labs Reviewed  CBC WITH DIFFERENTIAL/PLATELET - Abnormal; Notable for the following components:      Result Value   RBC 3.79 (*)    Hemoglobin 12.3 (*)    HCT 35.7 (*)    Platelets 73 (*)    Abs Immature Granulocytes 0.09 (*)  All other components within normal limits  COMPREHENSIVE METABOLIC PANEL - Abnormal; Notable for the following components:   Glucose, Bld 244 (*)    BUN 32 (*)    Creatinine, Ser 1.54 (*)    GFR calc non Af Amer 52 (*)    All other components within normal limits    EKG None  Radiology DG Toe Great Left  Result Date: 08/24/2019 CLINICAL DATA:  49 year old male with history of diabetes and fracture of the left great toe presenting with drainage from the toe. EXAM: LEFT GREAT TOE COMPARISON:  Left foot radiograph dated 07/15/2019. FINDINGS: Interval near complete healing of the previously seen fracture of the distal phalanx of the great toe. No new fracture. The bones are osteopenic. There is no dislocation. No periosteal elevation or bone erosion. There is diffuse subcutaneous edema. No soft tissue gas. Calcaneal fixation screw is partially visualized. IMPRESSION: 1. Near complete healing of the previously seen fracture of the distal phalanx of the great toe. No acute fracture. 2. No radiographic findings of osteomyelitis. Electronically Signed   By: Anner Crete M.D.   On: 08/24/2019 15:28    Procedures Procedures (including critical care time)  Medications Ordered in ED Medications - No data to display  ED Course  I have reviewed the triage vital signs and the nursing notes.  Pertinent labs & imaging results that were available during my care of the patient were reviewed by me and considered in my medical decision making (see chart for details).    MDM Rules/Calculators/A&P   Patient with a past medical history of diabetes presents to the ED with complaints of left great toe pain for the past week.  Reports he was evaluated in the ED  on July 20, 2019, diagnosed with a fracture of his left toe, states there has been drainage from his toe the past week.  He is felt a sharp pain radiating to the left great toe.  He is currently walking on a Cam walker as he reports he had a previous ankle fracture.  I have extensively reviewed his records, I do see that there was a previous acute intra-articular fracture at the base of the fifth distal phalanx.  During evaluation there is no erythema, pain with palpation of the nail.  Clear drainage noted below the nail.  No changes in skin, no streaking, no pain along the lateral or medial malleolus.  Lower suspicion for cellulitis.  He does have a history of diabetes type 2 although he reports he is "borderline diabetic ", his records indicate that he is actually a type II diabetic.  Will obtain labs along with repeat x-ray to further evaluate and rule out any osteomyelitis.  Interpretation of his labs by me.  CMP without any electrolyte derangement, glucose is 244, a mild AKI is noted.  He continues to deny diabetes, currently not on any medication.  He does have an appointment scheduled with his primary care on Wednesday, we discussed asking for treatment of his diabetes type 2.  No anion gap, I have low suspicion for DKA.  CBC without any leukocytosis, I have lower suspicion for cellulitis at this time.  His x-ray was clear.  X-ray of his left great toe showed: 1. Near complete healing of the previously seen fracture of the  distal phalanx of the great toe. No acute fracture.  2. No radiographic findings of osteomyelitis.   We discussed needing removal of his nail at this time, given the option of myself  versus specialist, patient does have an appointment with the specialist on June 9.  We discussed antibiotic treatment to prophylactically prevent any further infection.  He is agreeable of this at this time.  Vitals are within normal limits, he is afebrile, ambulatory with a Cam walker.  Return  precautions were discussed at length.  Patient is stable for discharge at this time.  We placed on a short course of Keflex in order to prevent further infected. Return precautions discussed at length.    Portions of this note were generated with Scientist, clinical (histocompatibility and immunogenetics). Dictation errors may occur despite best attempts at proofreading.  Final Clinical Impression(s) / ED Diagnoses Final diagnoses:  Great toe pain, left    Rx / DC Orders ED Discharge Orders         Ordered    cephALEXin (KEFLEX) 500 MG capsule  4 times daily     08/24/19 1644           Claude Manges, PA-C 08/24/19 1646    Tegeler, Canary Brim, MD 08/25/19 [phone]

## 2019-08-24 NOTE — Discharge Instructions (Addendum)
Your x-ray today did not show any osteomyelitis.  We discussed likely needing removal of toenail however we have decided on follow-up with specialist.  I have prescribed a short course of antibiotics, please take 1 tablet 4 times a day for the next 7 days.  If you experience any fevers, chills, worsening symptoms please return to the ED.

## 2019-08-27 ENCOUNTER — Emergency Department (HOSPITAL_BASED_OUTPATIENT_CLINIC_OR_DEPARTMENT_OTHER): Payer: PRIVATE HEALTH INSURANCE

## 2019-08-27 ENCOUNTER — Inpatient Hospital Stay (HOSPITAL_BASED_OUTPATIENT_CLINIC_OR_DEPARTMENT_OTHER)
Admission: EM | Admit: 2019-08-27 | Discharge: 2019-08-30 | DRG: 603 | Disposition: A | Discharge status: Home Health | Payer: PRIVATE HEALTH INSURANCE | Attending: Internal Medicine | Admitting: Internal Medicine

## 2019-08-27 ENCOUNTER — Other Ambulatory Visit: Payer: Self-pay

## 2019-08-27 ENCOUNTER — Encounter (HOSPITAL_BASED_OUTPATIENT_CLINIC_OR_DEPARTMENT_OTHER): Payer: Self-pay | Admitting: *Deleted

## 2019-08-27 DIAGNOSIS — Z981 Arthrodesis status: Secondary | ICD-10-CM

## 2019-08-27 DIAGNOSIS — Z8249 Family history of ischemic heart disease and other diseases of the circulatory system: Secondary | ICD-10-CM

## 2019-08-27 DIAGNOSIS — Z87891 Personal history of nicotine dependence: Secondary | ICD-10-CM

## 2019-08-27 DIAGNOSIS — Z20822 Contact with and (suspected) exposure to covid-19: Secondary | ICD-10-CM | POA: Diagnosis present

## 2019-08-27 DIAGNOSIS — D649 Anemia, unspecified: Secondary | ICD-10-CM | POA: Diagnosis present

## 2019-08-27 DIAGNOSIS — I10 Essential (primary) hypertension: Secondary | ICD-10-CM | POA: Diagnosis present

## 2019-08-27 DIAGNOSIS — L03116 Cellulitis of left lower limb: Secondary | ICD-10-CM | POA: Diagnosis not present

## 2019-08-27 DIAGNOSIS — M7989 Other specified soft tissue disorders: Secondary | ICD-10-CM | POA: Diagnosis present

## 2019-08-27 DIAGNOSIS — Z79899 Other long term (current) drug therapy: Secondary | ICD-10-CM

## 2019-08-27 DIAGNOSIS — D696 Thrombocytopenia, unspecified: Secondary | ICD-10-CM | POA: Diagnosis present

## 2019-08-27 DIAGNOSIS — K219 Gastro-esophageal reflux disease without esophagitis: Secondary | ICD-10-CM | POA: Diagnosis present

## 2019-08-27 DIAGNOSIS — E782 Mixed hyperlipidemia: Secondary | ICD-10-CM | POA: Diagnosis present

## 2019-08-27 DIAGNOSIS — X58XXXA Exposure to other specified factors, initial encounter: Secondary | ICD-10-CM | POA: Diagnosis present

## 2019-08-27 DIAGNOSIS — G4733 Obstructive sleep apnea (adult) (pediatric): Secondary | ICD-10-CM | POA: Diagnosis present

## 2019-08-27 DIAGNOSIS — S92402A Displaced unspecified fracture of left great toe, initial encounter for closed fracture: Secondary | ICD-10-CM | POA: Diagnosis present

## 2019-08-27 DIAGNOSIS — E1165 Type 2 diabetes mellitus with hyperglycemia: Secondary | ICD-10-CM | POA: Diagnosis present

## 2019-08-27 LAB — CBC WITH DIFFERENTIAL/PLATELET
Abs Immature Granulocytes: 0.07 10*3/uL (ref 0.00–0.07)
Basophils Absolute: 0 10*3/uL (ref 0.0–0.1)
Basophils Relative: 0 %
Eosinophils Absolute: 0.1 10*3/uL (ref 0.0–0.5)
Eosinophils Relative: 1 %
HCT: 39 % (ref 39.0–52.0)
Hemoglobin: 13 g/dL (ref 13.0–17.0)
Immature Granulocytes: 1 %
Lymphocytes Relative: 11 %
Lymphs Abs: 0.8 10*3/uL (ref 0.7–4.0)
MCH: 31.8 pg (ref 26.0–34.0)
MCHC: 33.3 g/dL (ref 30.0–36.0)
MCV: 95.4 fL (ref 80.0–100.0)
Monocytes Absolute: 0.2 10*3/uL (ref 0.1–1.0)
Monocytes Relative: 3 %
Neutro Abs: 6.1 10*3/uL (ref 1.7–7.7)
Neutrophils Relative %: 84 %
Platelets: 70 10*3/uL — ABNORMAL LOW (ref 150–400)
RBC: 4.09 MIL/uL — ABNORMAL LOW (ref 4.22–5.81)
RDW: 13.1 % (ref 11.5–15.5)
Smear Review: DECREASED
WBC: 7.3 10*3/uL (ref 4.0–10.5)
nRBC: 0 % (ref 0.0–0.2)

## 2019-08-27 LAB — COMPREHENSIVE METABOLIC PANEL
ALT: 36 U/L (ref 0–44)
AST: 25 U/L (ref 15–41)
Albumin: 4.3 g/dL (ref 3.5–5.0)
Alkaline Phosphatase: 58 U/L (ref 38–126)
Anion gap: 8 (ref 5–15)
BUN: 21 mg/dL — ABNORMAL HIGH (ref 6–20)
CO2: 26 mmol/L (ref 22–32)
Calcium: 9 mg/dL (ref 8.9–10.3)
Chloride: 104 mmol/L (ref 98–111)
Creatinine, Ser: 1.12 mg/dL (ref 0.61–1.24)
GFR calc Af Amer: >60 mL/min (ref >60)
GFR calc non Af Amer: >60 mL/min (ref >60)
Glucose, Bld: 218 mg/dL — ABNORMAL HIGH (ref 70–99)
Potassium: 4 mmol/L (ref 3.5–5.1)
Sodium: 138 mmol/L (ref 135–145)
Total Bilirubin: 0.9 mg/dL (ref 0.3–1.2)
Total Protein: 6.9 g/dL (ref 6.5–8.1)

## 2019-08-27 MED ORDER — CLINDAMYCIN PHOSPHATE 600 MG/50ML IV SOLN
600.0000 mg | Freq: Once | INTRAVENOUS | Status: AC
  Administered 2019-08-27: 600 mg via INTRAVENOUS
  Filled 2019-08-27: qty 50

## 2019-08-27 MED ORDER — OXYCODONE-ACETAMINOPHEN 5-325 MG PO TABS
1.0000 | ORAL_TABLET | Freq: Once | ORAL | Status: AC
  Administered 2019-08-28: 1 via ORAL
  Filled 2019-08-27: qty 1

## 2019-08-27 NOTE — ED Provider Notes (Signed)
[facility] EMERGENCY DEPARTMENT Provider Note   CSN: [id number] Arrival date & time: 08/27/19  1854     History Chief Complaint  Patient presents with  . Left Lower Extremity pain    Cody [name] is a 49 y.o. male.  Patient is a 49 year old male with a history of diabetes and hypertension who presents with left leg pain and swelling.  He has been seen several times in the ED for this leg.  He was seen in April 26 then noted to have a fracture of his left great toe.  He was seen on May 6 for calf pain and had a negative DVT study.  He was seen again on May 31 for drainage from his toe and was started on Keflex.  He says that now the redness has extended all the way up his leg and his leg is more swollen than it was.  He denies any known fevers.  No nausea or vomiting.  He is currently still on the Keflex.        Past Medical History:  Diagnosis Date  . Acid reflux   . Diabetes mellitus without complication (HCC)   . Hypertension   . Mixed hyperlipidemia   . Sleep apnea     Patient Active Problem List   Diagnosis Date Noted  . New onset type 2 diabetes mellitus (HCC) 12/06/2015  . Essential hypertension 12/05/2015  . GERD (gastroesophageal reflux disease) 12/05/2015  . Cellulitis of leg, right 12/04/2015  . AKI (acute kidney injury) (HCC) 12/04/2015  . Thrombocytopenia (HCC) 12/04/2015    Past Surgical History:  Procedure Laterality Date  . FOOT ARTHRODESIS Left 09/25/2018   Procedure: Left subtalar joint arthrodesis and peroneal tendon repair;  Surgeon: Toni Arthurs, MD;  Location: [facility];  Service: Orthopedics;  Laterality: Left;  . KNEE SURGERY         Family History  Problem Relation Age of Onset  . Hypertension Other     Social History   Tobacco Use  . Smoking status: Former Games developer  . Smokeless tobacco: Never Used  Substance Use Topics  . Alcohol use: No  . Drug use: No    Home Medications Prior to Admission medications    Medication Sig Start Date End Date Taking? Authorizing Provider  amLODipine (NORVASC) 10 MG tablet amlodipine 10 mg tablet  TAKE ONE TABLET BY MOUTH ONE TIME DAILY 04/04/18   [provider]  amoxicillin-clavulanate (AUGMENTIN) 875-125 MG tablet Take 1 tablet by mouth 2 (two) times daily. One po bid x 7 days 03/29/19   Arthor Captain, PA-C  aspirin EC 81 MG tablet Take 1 tablet (81 mg total) by mouth 2 (two) times daily. 09/25/18   Jacinta Shoe, PA-C  atorvastatin (LIPITOR) 40 MG tablet atorvastatin 40 mg tablet  TAKE ONE TABLET BY MOUTH ONE TIME DAILY 05/30/18   [provider]  bacitracin ointment Apply 1 application topically 2 (two) times daily. 06/24/19   Derwood Kaplan, MD  cephALEXin (KEFLEX) 500 MG capsule Take 1 capsule (500 mg total) by mouth 4 (four) times daily for 7 days. 08/24/19 08/31/19  Claude Manges, PA-C  chlorthalidone (HYGROTON) 25 MG tablet Take 25 mg by mouth daily.    [provider]  docusate sodium (COLACE) 100 MG capsule Take 1 capsule (100 mg total) by mouth 2 (two) times daily. While taking narcotic pain medicine. 09/25/18   Jacinta Shoe, PA-C  doxycycline (VIBRAMYCIN) 100 MG capsule Take 1 capsule (100 mg total)  by mouth 2 (two) times daily. One po bid x 7 days 07/15/19   Felicie Morn, NP  lisinopril (ZESTRIL) 40 MG tablet Take 40 mg by mouth daily.    [provider]  naproxen (NAPROSYN) 500 MG tablet naproxen 500 mg tablet  TAKE ONE TABLET BY MOUTH TWICE A DAY    [provider]  ondansetron (ZOFRAN ODT) 4 MG disintegrating tablet Take 1 tablet (4 mg total) by mouth every 8 (eight) hours as needed for nausea or vomiting. 08/30/18   Albrizze, Kaitlyn E, PA-C  senna (SENOKOT) 8.6 MG TABS tablet Take 2 tablets (17.2 mg total) by mouth 2 (two) times daily. 09/25/18   Jacinta Shoe, PA-C    Allergies    Patient has no known allergies.  Review of Systems   Review of Systems  Constitutional: Negative for chills,  diaphoresis, fatigue and fever.  HENT: Negative for congestion, rhinorrhea and sneezing.   Eyes: Negative.   Respiratory: Negative for cough, chest tightness and shortness of breath.   Cardiovascular: Positive for leg swelling. Negative for chest pain.  Gastrointestinal: Negative for abdominal pain, blood in stool, diarrhea, nausea and vomiting.  Genitourinary: Negative for difficulty urinating, flank pain, frequency and hematuria.  Musculoskeletal: Negative for arthralgias and back pain.  Skin: Positive for color change. Negative for rash.  Neurological: Negative for dizziness, speech difficulty, weakness, numbness and headaches.    Physical Exam Updated Vital Signs BP (!) 187/104 (BP Location: Left Arm)   Pulse 60   Temp 98.5 F (36.9 C) (Oral)   Resp 14   Ht 6\' 2"  (1.88 m)   Wt (!) 147 kg   SpO2 100%   BMI 41.60 kg/m   Physical Exam Constitutional:      Appearance: He is well-developed.  HENT:     Head: Normocephalic and atraumatic.  Eyes:     Pupils: Pupils are equal, round, and reactive to light.  Cardiovascular:     Rate and Rhythm: Normal rate and regular rhythm.     Heart sounds: Normal heart sounds.  Pulmonary:     Effort: Pulmonary effort is normal. No respiratory distress.     Breath sounds: Normal breath sounds. No wheezing or rales.  Chest:     Chest wall: No tenderness.  Abdominal:     General: Bowel sounds are normal.     Palpations: Abdomen is soft.     Tenderness: There is no abdominal tenderness. There is no guarding or rebound.  Musculoskeletal:        General: Normal range of motion.     Cervical back: Normal range of motion and neck supple.     Comments: Patient's left great toe is swollen with swelling extending up the foot and lower leg with diffuse redness to the foot/ankle and lower leg up to the knee.  There is some mild drainage from around the nail.  Pulses are intact.  The leg and foot are warm to the touch.  Lymphadenopathy:     Cervical:  No cervical adenopathy.  Skin:    General: Skin is warm and dry.     Findings: No rash.  Neurological:     Mental Status: He is alert and oriented to person, place, and time.     ED Results / Procedures / Treatments   Labs (all labs ordered are listed, but only abnormal results are displayed) Labs Reviewed  CBC WITH DIFFERENTIAL/PLATELET - Abnormal; Notable for the following components:      Result Value  RBC 4.09 (*)    Platelets 70 (*)    All other components within normal limits  COMPREHENSIVE METABOLIC PANEL - Abnormal; Notable for the following components:   Glucose, Bld 218 (*)    BUN 21 (*)    All other components within normal limits  SARS CORONAVIRUS 2 BY RT PCR (HOSPITAL ORDER, [place] [facility])    EKG None  Radiology DG Toe Great Left  Result Date: 08/27/2019 CLINICAL DATA:  49 year old male with possible osteomyelitis. EXAM: LEFT GREAT TOE COMPARISON:  Radiograph dated 08/24/2019. FINDINGS: There is no acute fracture or dislocation. The bones are osteopenic. No bone erosion or periosteal elevation to suggest osteomyelitis. There is mild diffuse subcutaneous edema. No radiopaque foreign object or soft tissue gas. IMPRESSION: No acute fracture or dislocation. No radiographic findings of osteomyelitis. Electronically Signed   By: Anner Crete M.D.   On: 08/27/2019 23:24    Procedures Procedures (including critical care time)  Medications Ordered in ED Medications  clindamycin (CLEOCIN) IVPB 600 mg (600 mg Intravenous New Bag/Given 08/27/19 2345)  oxyCODONE-acetaminophen (PERCOCET/ROXICET) 5-325 MG per tablet 1 tablet (has no administration in time range)    ED Course  I have reviewed the triage vital signs and the nursing notes.  Pertinent labs & imaging results that were available during my care of the patient were reviewed by me and considered in my medical decision making (see chart for details).    MDM Rules/Calculators/A&P                       Patient presents with apparent cellulitis of his left leg which is failed outpatient treatment.  He has thrombocytopenia which appears to be chronic.  No evidence of osteomyelitis on x-ray.  Labs are nonconcerning.  Will consult hospitalist for admission.  I spoke with Dr. Jonelle Sidle who will admit the patient for further treatment. Final Clinical Impression(s) / ED Diagnoses Final diagnoses:  Cellulitis of left lower extremity    Rx / DC Orders ED Discharge Orders    None       Malvin Johns, MD 08/28/19 [phone]

## 2019-08-27 NOTE — ED Notes (Signed)
[facility] ED nursing note: presents with pain in left lower extremity, left lower extremity very red, has marked swelling, red in color. Has been also now having nausea and vomiting.

## 2019-08-27 NOTE — ED Triage Notes (Signed)
Pt c/o n/v/d x 3 days , also c/o left great toe drainage , seen here for toe 5/31 completed ABX w/o improvement

## 2019-08-28 ENCOUNTER — Encounter (HOSPITAL_COMMUNITY): Payer: Self-pay | Admitting: Internal Medicine

## 2019-08-28 DIAGNOSIS — Z20822 Contact with and (suspected) exposure to covid-19: Secondary | ICD-10-CM | POA: Diagnosis present

## 2019-08-28 DIAGNOSIS — M7989 Other specified soft tissue disorders: Secondary | ICD-10-CM | POA: Diagnosis present

## 2019-08-28 DIAGNOSIS — Z8249 Family history of ischemic heart disease and other diseases of the circulatory system: Secondary | ICD-10-CM | POA: Diagnosis not present

## 2019-08-28 DIAGNOSIS — I10 Essential (primary) hypertension: Secondary | ICD-10-CM | POA: Diagnosis present

## 2019-08-28 DIAGNOSIS — E782 Mixed hyperlipidemia: Secondary | ICD-10-CM | POA: Diagnosis present

## 2019-08-28 DIAGNOSIS — Z79899 Other long term (current) drug therapy: Secondary | ICD-10-CM | POA: Diagnosis not present

## 2019-08-28 DIAGNOSIS — D696 Thrombocytopenia, unspecified: Secondary | ICD-10-CM | POA: Diagnosis present

## 2019-08-28 DIAGNOSIS — L03115 Cellulitis of right lower limb: Secondary | ICD-10-CM | POA: Insufficient documentation

## 2019-08-28 DIAGNOSIS — X58XXXA Exposure to other specified factors, initial encounter: Secondary | ICD-10-CM | POA: Diagnosis present

## 2019-08-28 DIAGNOSIS — Z981 Arthrodesis status: Secondary | ICD-10-CM | POA: Diagnosis not present

## 2019-08-28 DIAGNOSIS — Z87891 Personal history of nicotine dependence: Secondary | ICD-10-CM | POA: Diagnosis not present

## 2019-08-28 DIAGNOSIS — D649 Anemia, unspecified: Secondary | ICD-10-CM | POA: Diagnosis present

## 2019-08-28 DIAGNOSIS — S92402A Displaced unspecified fracture of left great toe, initial encounter for closed fracture: Secondary | ICD-10-CM | POA: Diagnosis present

## 2019-08-28 DIAGNOSIS — E1165 Type 2 diabetes mellitus with hyperglycemia: Secondary | ICD-10-CM | POA: Diagnosis present

## 2019-08-28 DIAGNOSIS — G4733 Obstructive sleep apnea (adult) (pediatric): Secondary | ICD-10-CM | POA: Diagnosis present

## 2019-08-28 DIAGNOSIS — K219 Gastro-esophageal reflux disease without esophagitis: Secondary | ICD-10-CM | POA: Diagnosis present

## 2019-08-28 DIAGNOSIS — L03116 Cellulitis of left lower limb: Secondary | ICD-10-CM | POA: Diagnosis present

## 2019-08-28 LAB — SARS CORONAVIRUS 2 BY RT PCR (HOSPITAL ORDER, PERFORMED IN ~~LOC~~ HOSPITAL LAB): SARS Coronavirus 2: NEGATIVE

## 2019-08-28 MED ORDER — VANCOMYCIN HCL 2000 MG/400ML IV SOLN
2000.0000 mg | Freq: Once | INTRAVENOUS | Status: AC
  Administered 2019-08-29: 2000 mg via INTRAVENOUS
  Filled 2019-08-28: qty 400

## 2019-08-28 MED ORDER — MORPHINE SULFATE (PF) 2 MG/ML IV SOLN
2.0000 mg | INTRAVENOUS | Status: DC | PRN
  Administered 2019-08-28 – 2019-08-30 (×3): 2 mg via INTRAVENOUS
  Filled 2019-08-28 (×3): qty 1

## 2019-08-28 MED ORDER — CHLORTHALIDONE 25 MG PO TABS
25.0000 mg | ORAL_TABLET | Freq: Every day | ORAL | Status: DC
  Administered 2019-08-29 – 2019-08-30 (×2): 25 mg via ORAL
  Filled 2019-08-28 (×3): qty 1

## 2019-08-28 MED ORDER — ONDANSETRON HCL 4 MG PO TABS: 4.0000 mg | ORAL_TABLET | Freq: Four times a day (QID) | ORAL | Status: DC | PRN

## 2019-08-28 MED ORDER — ONDANSETRON HCL 4 MG/2ML IJ SOLN: 4.0000 mg | Freq: Four times a day (QID) | INTRAMUSCULAR | Status: DC | PRN

## 2019-08-28 MED ORDER — PANTOPRAZOLE SODIUM 40 MG PO TBEC
40.0000 mg | DELAYED_RELEASE_TABLET | Freq: Every day | ORAL | Status: DC
  Administered 2019-08-29 – 2019-08-30 (×2): 40 mg via ORAL
  Filled 2019-08-28 (×2): qty 1

## 2019-08-28 MED ORDER — PIPERACILLIN-TAZOBACTAM 3.375 G IVPB
3.3750 g | Freq: Three times a day (TID) | INTRAVENOUS | Status: DC
  Administered 2019-08-29 – 2019-08-30 (×5): 3.375 g via INTRAVENOUS
  Filled 2019-08-28 (×7): qty 50

## 2019-08-28 MED ORDER — KETOROLAC TROMETHAMINE 15 MG/ML IJ SOLN: 15.0000 mg | Freq: Four times a day (QID) | INTRAMUSCULAR | Status: DC | PRN

## 2019-08-28 MED ORDER — HYDRALAZINE HCL 20 MG/ML IJ SOLN
10.0000 mg | INTRAMUSCULAR | Status: DC | PRN
  Administered 2019-08-29 (×2): 10 mg via INTRAVENOUS
  Filled 2019-08-28 (×2): qty 1

## 2019-08-28 MED ORDER — AMLODIPINE BESYLATE 10 MG PO TABS
10.0000 mg | ORAL_TABLET | Freq: Every day | ORAL | Status: DC
  Administered 2019-08-28 – 2019-08-30 (×3): 10 mg via ORAL
  Filled 2019-08-28: qty 1
  Filled 2019-08-28: qty 2
  Filled 2019-08-28: qty 1

## 2019-08-28 MED ORDER — INSULIN GLARGINE 100 UNIT/ML ~~LOC~~ SOLN
10.0000 [IU] | Freq: Every day | SUBCUTANEOUS | Status: DC
  Administered 2019-08-29 (×2): 10 [IU] via SUBCUTANEOUS
  Filled 2019-08-28 (×2): qty 0.1

## 2019-08-28 MED ORDER — ATORVASTATIN CALCIUM 40 MG PO TABS
40.0000 mg | ORAL_TABLET | Freq: Every day | ORAL | Status: DC
  Administered 2019-08-28 – 2019-08-30 (×3): 40 mg via ORAL
  Filled 2019-08-28 (×3): qty 1

## 2019-08-28 MED ORDER — INSULIN ASPART 100 UNIT/ML ~~LOC~~ SOLN
0.0000 [IU] | Freq: Three times a day (TID) | SUBCUTANEOUS | Status: DC
  Administered 2019-08-29: 2 [IU] via SUBCUTANEOUS
  Administered 2019-08-29: 3 [IU] via SUBCUTANEOUS
  Administered 2019-08-29 – 2019-08-30 (×2): 2 [IU] via SUBCUTANEOUS

## 2019-08-28 MED ORDER — OXYCODONE-ACETAMINOPHEN 5-325 MG PO TABS
1.0000 | ORAL_TABLET | Freq: Four times a day (QID) | ORAL | Status: DC | PRN
  Administered 2019-08-28: 2 via ORAL
  Filled 2019-08-28 (×2): qty 2

## 2019-08-28 MED ORDER — CLINDAMYCIN PHOSPHATE 600 MG/50ML IV SOLN
600.0000 mg | Freq: Three times a day (TID) | INTRAVENOUS | Status: DC
  Administered 2019-08-28: 600 mg via INTRAVENOUS
  Filled 2019-08-28 (×3): qty 50

## 2019-08-28 MED ORDER — DOCUSATE SODIUM 100 MG PO CAPS
100.0000 mg | ORAL_CAPSULE | Freq: Two times a day (BID) | ORAL | Status: DC
  Administered 2019-08-28 – 2019-08-30 (×6): 100 mg via ORAL
  Filled 2019-08-28 (×6): qty 1

## 2019-08-28 MED ORDER — ASPIRIN EC 81 MG PO TBEC
81.0000 mg | DELAYED_RELEASE_TABLET | Freq: Two times a day (BID) | ORAL | Status: DC
  Administered 2019-08-28 (×3): 81 mg via ORAL
  Filled 2019-08-28 (×3): qty 1

## 2019-08-28 MED ORDER — HYDRALAZINE HCL 50 MG PO TABS
50.0000 mg | ORAL_TABLET | Freq: Four times a day (QID) | ORAL | Status: DC | PRN
  Administered 2019-08-28: 50 mg via ORAL
  Filled 2019-08-28: qty 1

## 2019-08-28 MED ORDER — VANCOMYCIN HCL IN DEXTROSE 1-5 GM/200ML-% IV SOLN: 1000.0000 mg | Freq: Once | INTRAVENOUS | Status: DC

## 2019-08-28 MED ORDER — PIPERACILLIN-TAZOBACTAM 3.375 G IVPB 30 MIN: 3.3750 g | Freq: Once | INTRAVENOUS | Status: DC

## 2019-08-28 MED ORDER — CLINDAMYCIN PHOSPHATE 600 MG/50ML IV SOLN
600.0000 mg | Freq: Three times a day (TID) | INTRAVENOUS | Status: DC
  Administered 2019-08-28: 600 mg via INTRAVENOUS
  Filled 2019-08-28: qty 50

## 2019-08-28 MED ORDER — LISINOPRIL 20 MG PO TABS
40.0000 mg | ORAL_TABLET | Freq: Every day | ORAL | Status: DC
  Administered 2019-08-28 – 2019-08-30 (×3): 40 mg via ORAL
  Filled 2019-08-28: qty 2
  Filled 2019-08-28: qty 4
  Filled 2019-08-28: qty 2

## 2019-08-28 NOTE — ED Notes (Signed)
[facility] notified Michele Mcalpine) - ready for transport

## 2019-08-28 NOTE — ED Notes (Signed)
Pt given two frozen meals [place] and meatloaf and water to drink.

## 2019-08-28 NOTE — Progress Notes (Signed)
Cody [name] admits paged that patient has arrived to room 1323. Patients blood pressure 170/109 reported to on coming RN, awaiting new orders to be placed.

## 2019-08-28 NOTE — ED Notes (Signed)
Pt is sleeping and RN Sam was informed.

## 2019-08-28 NOTE — H&P (Signed)
History and Physical    Cody [name] VFI:[id number] DOB: 11-10-1970 DOA: 08/27/2019  PCP: [facility]  Patient coming from: Home.  Chief Complaint: Left foot great toe swelling and discharge.  HPI: Cody [name] is a 49 y.o. male with history of hypertension, diabetes mellitus type 2, hyperlipidemia sleep apnea chronic thrombocytopenia presented to the ER at [facility] with complaints of worsening pain and swelling of the left foot and particularly the left great toe.  Patient states about 4 to 5 weeks ago patient had injured his left great toe and had gone to the ER and was treated symptomatically.  Subsiding which repeat x-rays showed fracture of the distal phalanx.  Patient started noticing increasing discharge in his nail on the left great toe was getting detached.  He had come to the ER about a week ago and at the time he was noticing increasing discharge and x-rays done at that time showed that his toe fracture had healed.  He was kept on 2 antibiotics for almost a week despite which he is swelling and pain worsened.  He had a Doppler done about a month ago which was negative for DVT.  He noticed increasing swelling in the left lower extremity also.  ED Course: In the ER patient had repeat x-ray which does not show any acute but on exam patient's to look inflamed there is also swelling involving the left foot and distal aspect of the left leg.  Patient admitted for failed outpatient therapy of antibiotics and further management.  Labs show platelets of 70 which is appear to be chronic glucose 218 Covid test was negative.  Review of Systems: As per HPI, rest all negative.   Past Medical History:  Diagnosis Date  . Acid reflux   . Diabetes mellitus without complication (HCC)   . Hypertension   . Mixed hyperlipidemia   . Sleep apnea     Past Surgical History:  Procedure Laterality Date  . FOOT ARTHRODESIS Left 09/25/2018   Procedure: Left subtalar joint arthrodesis  and peroneal tendon repair;  Surgeon: Toni Arthurs, MD;  Location: [facility];  Service: Orthopedics;  Laterality: Left;  . KNEE SURGERY       reports that he has quit smoking. He has never used smokeless tobacco. He reports that he does not drink alcohol or use drugs.  No Known Allergies  Family History  Problem Relation Age of Onset  . Hypertension Other     Prior to Admission medications   Medication Sig Start Date End Date Taking? Authorizing Provider  amLODipine (NORVASC) 10 MG tablet Take 10 mg by mouth daily.  04/04/18  Yes [provider]  aspirin EC 81 MG tablet Take 1 tablet (81 mg total) by mouth 2 (two) times daily. 09/25/18  Yes Jacinta Shoe, PA-C  atorvastatin (LIPITOR) 40 MG tablet Take 40 mg by mouth daily.  05/30/18  Yes [provider]  bacitracin ointment Apply 1 application topically 2 (two) times daily. 06/24/19  Yes Derwood Kaplan, MD  Cholecalciferol (VITAMIN D3) 125 MCG (5000 UT) CAPS Take 5,000 Units by mouth daily.  04/30/19  Yes [provider]  docusate sodium (COLACE) 100 MG capsule Take 1 capsule (100 mg total) by mouth 2 (two) times daily. While taking narcotic pain medicine. 09/25/18  Yes Jacinta Shoe, PA-C  DULoxetine (CYMBALTA) 30 MG capsule Take 30 mg by mouth daily. 04/27/19  Yes [provider]  GLIPIZIDE XL 10 MG 24 hr tablet  Take 10 mg by mouth daily. 06/18/19  Yes [provider]  lisinopril (ZESTRIL) 40 MG tablet Take 40 mg by mouth daily.   Yes [provider]  metFORMIN (GLUCOPHAGE) 1000 MG tablet Take 1,000 mg by mouth 2 (two) times daily with a meal.   Yes [provider]  naproxen (NAPROSYN) 500 MG tablet Take 500 mg by mouth 2 (two) times daily with a meal.    Yes [provider]  pantoprazole (PROTONIX) 40 MG tablet Take 40 mg by mouth daily. 05/19/19  Yes [provider]  senna (SENOKOT) 8.6 MG TABS tablet Take 2 tablets (17.2 mg total) by mouth 2  (two) times daily. 09/25/18  Yes Corky Sing, PA-C  amoxicillin-clavulanate (AUGMENTIN) 875-125 MG tablet Take 1 tablet by mouth 2 (two) times daily. One po bid x 7 days Patient not taking: Reported on 08/28/2019 03/29/19   Margarita Mail, PA-C  cephALEXin (KEFLEX) 500 MG capsule Take 1 capsule (500 mg total) by mouth 4 (four) times daily for 7 days. Patient not taking: Reported on 08/28/2019 08/24/19 08/31/19  Janeece Fitting, PA-C  chlorthalidone (HYGROTON) 25 MG tablet Take 25 mg by mouth daily.    [provider]  doxycycline (VIBRAMYCIN) 100 MG capsule Take 1 capsule (100 mg total) by mouth 2 (two) times daily. One po bid x 7 days Patient not taking: Reported on 08/28/2019 07/15/19   Etta Quill, NP  ondansetron (ZOFRAN ODT) 4 MG disintegrating tablet Take 1 tablet (4 mg total) by mouth every 8 (eight) hours as needed for nausea or vomiting. Patient not taking: Reported on 08/28/2019 08/30/18   Albrizze, Harley Hallmark, PA-C    Physical Exam: Constitutional: Moderately built and nourished. Vitals:   08/28/19 1800 08/28/19 1849 08/28/19 1859 08/28/19 2219  BP: (!) 152/98 (!) 185/114 (!) 170/109 (!) 160/89  Pulse:  66  73  Resp:  18  16  Temp:  97.7 F (36.5 C)  98.2 F (36.8 C)  TempSrc:  Oral  Oral  SpO2:  100%  100%  Weight:      Height:       Eyes: Anicteric no pallor. ENMT: No discharge from the ears eyes nose or mouth. Neck: No mass felt.  No neck rigidity. Respiratory: No rhonchi or crepitations. Cardiovascular: S1-S2 heard. Abdomen: Soft nontender bowel sounds present. Musculoskeletal: Left foot and left great toe looks swollen as good pulses on Doppler.  The swelling of the left foot extends up to the distal aspect of the left leg. Skin: Left great toe appears inflamed. Neurologic: Alert awake oriented to time place and person.  Moves all extremities. Psychiatric: Appears normal per normal affect.   Labs on Admission: I have personally reviewed following labs and imaging  studies  CBC: Recent Labs  Lab 08/24/19 1525 08/27/19 2044  WBC 5.3 7.3  NEUTROABS 2.8 6.1  HGB 12.3* 13.0  HCT 35.7* 39.0  MCV 94.2 95.4  PLT 73* 70*   Basic Metabolic Panel: Recent Labs  Lab 08/24/19 1525 08/27/19 2044  NA 138 138  K 4.6 4.0  CL 104 104  CO2 27 26  GLUCOSE 244* 218*  BUN 32* 21*  CREATININE 1.54* 1.12  CALCIUM 9.1 9.0   GFR: Estimated Creatinine Clearance: 120.6 mL/min (by C-G formula based on SCr of 1.12 mg/dL). Liver Function Tests: Recent Labs  Lab 08/24/19 1525 08/27/19 2044  AST 18 25  ALT 31 36  ALKPHOS 91 58  BILITOT 0.7 0.9  PROT 6.7 6.9  ALBUMIN  4.1 4.3   No results for input(s): LIPASE, AMYLASE in the last 168 hours. No results for input(s): AMMONIA in the last 168 hours. Coagulation Profile: No results for input(s): INR, PROTIME in the last 168 hours. Cardiac Enzymes: No results for input(s): CKTOTAL, CKMB, CKMBINDEX, TROPONINI in the last 168 hours. BNP (last 3 results) No results for input(s): PROBNP in the last 8760 hours. HbA1C: No results for input(s): HGBA1C in the last 72 hours. CBG: No results for input(s): GLUCAP in the last 168 hours. Lipid Profile: No results for input(s): CHOL, HDL, LDLCALC, TRIG, CHOLHDL, LDLDIRECT in the last 72 hours. Thyroid Function Tests: No results for input(s): TSH, T4TOTAL, FREET4, T3FREE, THYROIDAB in the last 72 hours. Anemia Panel: No results for input(s): VITAMINB12, FOLATE, FERRITIN, TIBC, IRON, RETICCTPCT in the last 72 hours. Urine analysis:    Component Value Date/Time   COLORURINE YELLOW 08/30/2018 1707   APPEARANCEUR CLEAR 08/30/2018 1707   LABSPEC 1.025 08/30/2018 1707   PHURINE 6.0 08/30/2018 1707   GLUCOSEU NEGATIVE 08/30/2018 1707   HGBUR NEGATIVE 08/30/2018 1707   BILIRUBINUR NEGATIVE 08/30/2018 1707   KETONESUR NEGATIVE 08/30/2018 1707   PROTEINUR NEGATIVE 08/30/2018 1707   UROBILINOGEN 1.0 01/15/2011 2309   NITRITE NEGATIVE 08/30/2018 1707   LEUKOCYTESUR  NEGATIVE 08/30/2018 1707   Sepsis Labs: @LABRCNTIP (procalcitonin:4,lacticidven:4) ) Recent Results (from the past 240 hour(s))  SARS Coronavirus 2 by RT PCR (hospital order, performed in [facility] hospital lab) Nasopharyngeal Nasopharyngeal Swab     Status: None   Collection Time: 08/28/19 12:16 AM   Specimen: Nasopharyngeal Swab  Result Value Ref Range Status   SARS Coronavirus 2 NEGATIVE NEGATIVE Final    Comment: (NOTE) SARS-CoV-2 target nucleic acids are NOT DETECTED. The SARS-CoV-2 RNA is generally detectable in upper and lower respiratory specimens during the acute phase of infection. The lowest concentration of SARS-CoV-2 viral copies this assay can detect is 250 copies / mL. A negative result does not preclude SARS-CoV-2 infection and should not be used as the sole basis for treatment or other patient management decisions.  A negative result may occur with improper specimen collection / handling, submission of specimen other than nasopharyngeal swab, presence of viral mutation(s) within the areas targeted by this assay, and inadequate number of viral copies (<250 copies / mL). A negative result must be combined with clinical observations, patient history, and epidemiological information. Fact Sheet for Patients:   10/28/19 Fact Sheet for Healthcare Providers: BoilerBrush.com.cy This test is not yet approved or cleared  by the https://pope.com/ FDA and has been authorized for detection and/or diagnosis of SARS-CoV-2 by FDA under an Emergency Use Authorization (EUA).  This EUA will remain in effect (meaning this test can be used) for the duration of the COVID-19 declaration under Section 564(b)(1) of the Act, 21 U.S.C. section 360bbb-3(b)(1), unless the authorization is terminated or revoked sooner. Performed at [facility], [address] [place], [place]      Radiological Exams on  Admission: DG Toe Great Left  Result Date: 08/27/2019 CLINICAL DATA:  49 year old male with possible osteomyelitis. EXAM: LEFT GREAT TOE COMPARISON:  Radiograph dated 08/24/2019. FINDINGS: There is no acute fracture or dislocation. The bones are osteopenic. No bone erosion or periosteal elevation to suggest osteomyelitis. There is mild diffuse subcutaneous edema. No radiopaque foreign object or soft tissue gas. IMPRESSION: No acute fracture or dislocation. No radiographic findings of osteomyelitis. Electronically Signed   By: 08/26/2019 M.D.   On: 08/27/2019 23:24  Assessment/Plan Principal Problem:   Cellulitis of left foot Active Problems:   Thrombocytopenia (HCC)   Essential hypertension   Uncontrolled type 2 diabetes mellitus with hyperglycemia (HCC)    1. Cellulitis of the left foot for which I have placed patient on empiric antibiotics.  Will check MRI of the left foot since patient has significant swelling to make sure there is no osteomyelitis or abscess.  Patient usually follows with Dr. Victorino Dike orthopedic surgeon with whom patient has had previous surgery of the same foot.  We will also order Doppler to rule out DVT. 2. Diabetes mellitus type 2 with hyperglycemia we will check hemoglobin A1c for now I kept patient on Lantus 10 units along with sliding scale coverage will hold off oral hypoglycemics for now. 3. Uncontrolled hypertension for which I have added as needed IV hydralazine along with patient's home dose of amlodipine hydrochlorothiazide and lisinopril. 4. Chronic thrombocytopenia cause not clear. 5. Anemia follow CBC. 6. History of GERD on PPI. 7. Sleep apnea.   Given the significant cellulitis patient will need close monitoring and inpatient status.   DVT prophylaxis: Patient is not on pharmacological DVT prophylaxis due to severe thrombocytopenia.  Not on SCDs until we rule out DVT. Code Status: Full code. Family Communication: Discussed with  patient. Disposition Plan: Home. Consults called: None. Admission status: Inpatient.   Eduard Clos MD [facility] Pager [phone].  If 7PM-7AM, please contact night-coverage [email or web address] Password [facility]  08/28/2019, 10:36 PM

## 2019-08-29 ENCOUNTER — Inpatient Hospital Stay (HOSPITAL_COMMUNITY): Payer: PRIVATE HEALTH INSURANCE

## 2019-08-29 LAB — BASIC METABOLIC PANEL
Anion gap: 8 (ref 5–15)
BUN: 15 mg/dL (ref 6–20)
CO2: 27 mmol/L (ref 22–32)
Calcium: 9 mg/dL (ref 8.9–10.3)
Chloride: 101 mmol/L (ref 98–111)
Creatinine, Ser: 1.06 mg/dL (ref 0.61–1.24)
GFR calc Af Amer: >60 mL/min (ref >60)
GFR calc non Af Amer: >60 mL/min (ref >60)
Glucose, Bld: 208 mg/dL — ABNORMAL HIGH (ref 70–99)
Potassium: 3.6 mmol/L (ref 3.5–5.1)
Sodium: 136 mmol/L (ref 135–145)

## 2019-08-29 LAB — CBC
HCT: 40.2 % (ref 39.0–52.0)
Hemoglobin: 13.7 g/dL (ref 13.0–17.0)
MCH: 32.5 pg (ref 26.0–34.0)
MCHC: 34.1 g/dL (ref 30.0–36.0)
MCV: 95.5 fL (ref 80.0–100.0)
Platelets: 77 K/uL — ABNORMAL LOW (ref 150–400)
RBC: 4.21 MIL/uL — ABNORMAL LOW (ref 4.22–5.81)
RDW: 12.8 % (ref 11.5–15.5)
WBC: 6.7 K/uL (ref 4.0–10.5)
nRBC: 0 % (ref 0.0–0.2)

## 2019-08-29 LAB — GLUCOSE, CAPILLARY
Glucose-Capillary: 187 mg/dL — ABNORMAL HIGH (ref 70–99)
Glucose-Capillary: 188 mg/dL — ABNORMAL HIGH (ref 70–99)
Glucose-Capillary: 194 mg/dL — ABNORMAL HIGH (ref 70–99)
Glucose-Capillary: 224 mg/dL — ABNORMAL HIGH (ref 70–99)
Glucose-Capillary: 247 mg/dL — ABNORMAL HIGH (ref 70–99)

## 2019-08-29 LAB — HEMOGLOBIN A1C
Hgb A1c MFr Bld: 8.4 % — ABNORMAL HIGH (ref 4.8–5.6)
Mean Plasma Glucose: 194.38 mg/dL

## 2019-08-29 LAB — HIV ANTIBODY (ROUTINE TESTING W REFLEX): HIV Screen 4th Generation wRfx: NONREACTIVE

## 2019-08-29 MED ORDER — VANCOMYCIN HCL 1250 MG/250ML IV SOLN
1250.0000 mg | Freq: Two times a day (BID) | INTRAVENOUS | Status: DC
  Administered 2019-08-29 – 2019-08-30 (×3): 1250 mg via INTRAVENOUS
  Filled 2019-08-29 (×3): qty 250

## 2019-08-29 MED ORDER — PRO-STAT SUGAR FREE PO LIQD
30.0000 mL | Freq: Two times a day (BID) | ORAL | Status: DC
  Administered 2019-08-29 – 2019-08-30 (×3): 30 mL via ORAL
  Filled 2019-08-29 (×3): qty 30

## 2019-08-29 MED ORDER — HEPARIN SODIUM (PORCINE) 5000 UNIT/ML IJ SOLN
5000.0000 [IU] | Freq: Three times a day (TID) | INTRAMUSCULAR | Status: DC
  Administered 2019-08-29 – 2019-08-30 (×3): 5000 [IU] via SUBCUTANEOUS
  Filled 2019-08-29 (×3): qty 1

## 2019-08-29 MED ORDER — ENSURE MAX PROTEIN PO LIQD
11.0000 [oz_av] | Freq: Every day | ORAL | Status: DC
  Administered 2019-08-29 – 2019-08-30 (×2): 11 [oz_av] via ORAL
  Filled 2019-08-29 (×2): qty 330

## 2019-08-29 MED ORDER — SODIUM CHLORIDE 0.9 % IV SOLN
INTRAVENOUS | Status: DC | PRN
  Administered 2019-08-29: 250 mL via INTRAVENOUS

## 2019-08-29 MED ORDER — POLYETHYLENE GLYCOL 3350 17 G PO PACK: 17.0000 g | PACK | Freq: Every day | ORAL | Status: DC | PRN

## 2019-08-29 MED ORDER — ADULT MULTIVITAMIN W/MINERALS CH
1.0000 | ORAL_TABLET | Freq: Every day | ORAL | Status: DC
  Administered 2019-08-29 – 2019-08-30 (×2): 1 via ORAL
  Filled 2019-08-29 (×2): qty 1

## 2019-08-29 MED ORDER — VANCOMYCIN HCL IN DEXTROSE 1-5 GM/200ML-% IV SOLN: 1000.0000 mg | Freq: Once | INTRAVENOUS | Status: DC

## 2019-08-29 MED ORDER — OXYCODONE HCL 5 MG PO TABS
5.0000 mg | ORAL_TABLET | ORAL | Status: DC | PRN
  Administered 2019-08-29 – 2019-08-30 (×5): 5 mg via ORAL
  Filled 2019-08-29 (×5): qty 1

## 2019-08-29 MED ORDER — SENNOSIDES-DOCUSATE SODIUM 8.6-50 MG PO TABS: 2.0000 | ORAL_TABLET | Freq: Every evening | ORAL | Status: DC | PRN

## 2019-08-29 MED ORDER — HEPARIN SODIUM (PORCINE) 5000 UNIT/ML IJ SOLN
5000.0000 [IU] | Freq: Three times a day (TID) | INTRAMUSCULAR | Status: DC
  Administered 2019-08-29: 5000 [IU] via SUBCUTANEOUS
  Filled 2019-08-29: qty 1

## 2019-08-29 MED ORDER — JUVEN PO PACK
1.0000 | PACK | Freq: Two times a day (BID) | ORAL | Status: DC
  Administered 2019-08-29 – 2019-08-30 (×2): 1 via ORAL
  Filled 2019-08-29 (×3): qty 1

## 2019-08-29 NOTE — Progress Notes (Signed)
Pharmacy Antibiotic Note  Cody [name] is a 49 y.o. male admitted on 08/27/2019 with cellulitis.  Pharmacy has been consulted for Vancomycin and Zosyn dosing.  Plan: Zosyn 3.375g IV q8h (4 hour infusion).   Vancomycin 2gm iv x1, then Vancomycin 1250mg  iv q12hr Target vancomycin trough 10-15    Height: 6\' 2"  (188 cm) Weight: (!) 147 kg (324 lb) IBW/kg (Calculated) : 82.2  Temp (24hrs), Avg:98.2 F (36.8 C), Min:97.7 F (36.5 C), Max:98.5 F (36.9 C)  Recent Labs  Lab 08/24/19 1525 08/27/19 2044  WBC 5.3 7.3  CREATININE 1.54* 1.12    Estimated Creatinine Clearance: 120.6 mL/min (by C-G formula based on SCr of 1.12 mg/dL).    No Known Allergies  Antimicrobials this admission: Vancomycin 08/28/2019 >> Zosyn 08/28/2019 >>  Dose adjustments this admission: -  Microbiology results: -  Thank you for allowing pharmacy to be a part of this patient's care.  10/28/2019 [place] 08/29/2019 1:44 AM

## 2019-08-29 NOTE — Progress Notes (Signed)
Initial Nutrition Assessment  DOCUMENTATION CODES:   Morbid obesity  INTERVENTION:  Ensure Max po daily, each supplement provides 150 kcal and 30 grams of protein (chocolate) Prostat 30 ml po BID, each supplement provides 100 kcal and 15 grams of protein Juven BID, each packet provides 95 calories, 2.5 grams of protein (collagen), and 9.8 grams of carbohydrate (3 grams sugar); also contains 7 grams of L-arginine and L-glutamine, 300 mg vitamin C, 15 mg vitamin E, 1.2 mcg vitamin B-12, 9.5 mg zinc, 200 mg calcium, and 1.5 g  Calcium Beta-hydroxy-Beta-methylbutyrate to support wound healing MVI with minerals daily   NUTRITION DIAGNOSIS:   Increased nutrient needs related to wound healing as evidenced by estimated needs.  GOAL:   Patient will meet greater than or equal to 90% of their needs    MONITOR:   PO intake, Labs, Weight trends, I & O's, Supplement acceptance, Skin  REASON FOR ASSESSMENT:   Malnutrition Screening Tool    ASSESSMENT:  RD working remotely.  49 year old male admitted for cellulitis of left foot after presenting with complaints of worsening pain, increasing discharge in his nail on left great toe with detachment and swelling of left foot after sustaining a fracture of distal phalanx  4-5 weeks ago. Past medical history of HTN, DM2, HLD, OSA, chronic thrombocytopenia.  Patient with 0% po intake x 1 documented meal this admission. Spoke with patient via phone this morning, he reports having scrambled eggs, grits and Raisin  Bran for breakfast. Patient reports usual intake of 2 meals/day at home, recalls whole grain bread, lots of [place], and greens. Patient amenable to Ensure Max and would prefer chocolate flavor to aid with meeting needs as well as Juven supplement to promote wound healing.  Noted mild pitting RLE, deep pitting LLE edema per RN assessment Current wt 323.4 lb Per history, weights have trended up ~13 lb in the past 5 days, suspect this is due to  current fluid status and will use prior 141.1 kg for estimating needs. Weights reviewed and have remained stable over the past 2 years. Patient is morbidly obese and would benefit from weight loss however obesity is a complex, chronic medical condition that is optimally managed by a multidisciplinary care team. Weight loss is not an ideal goal for an acute inpatient hospitalization. However, if further work-up for obesity is warranted, consider outpatient referral to outpatient bariatric service and/or [facility].   Medications reviewed and include: Colace, SSI, Lantus 10 units at bedtime, Protonix IVF: NaCl IVPB: Zosyn, Vancomycin Labs: CBGs 187,194 Lab Results  Component Value Date   HGBA1C 6.5 (H) 12/04/2015     NUTRITION - FOCUSED PHYSICAL EXAM: Unable to complete at this time, RD working remotely.  Diet Order:   Diet Order            Diet heart healthy/carb modified Room service appropriate? Yes; Fluid consistency: Thin  Diet effective now              EDUCATION NEEDS:   Education needs have been addressed  Skin:  Skin Assessment: Skin Integrity Issues: Skin Integrity Issues:: Other (Comment) Other: Cellulitis; left foot  Last BM:  6/4  Height:   Ht Readings from Last 1 Encounters:  08/27/19 6\' 2"  (1.88 m)    Weight:   Wt Readings from Last 1 Encounters:  08/27/19 (!) 147 kg   BMI:  Body mass index is 41.6 kg/m.  Estimated Nutritional Needs:   Kcal:  10/27/19  Protein:  117-130  Fluid:  >/= 2.3 L/day   Lajuan Lines, RD, LDN Clinical Nutrition After Hours/Weekend Pager # in [place]

## 2019-08-29 NOTE — Progress Notes (Signed)
PROGRESS NOTE    Cody [name]  ENI:[id number] DOB: [birth year] DOA: 08/27/2019 PCP: [facility]   Brief Narrative:  Doing well with history of HTN, DM2, HLD, sleep apnea, chronic thrombocytopenia presented from [place] for evaluation of worsening left lower extremity foot pain and great toe pain.  Had injury about 4 weeks ago.  X-ray showed distal phalanx fracture, failed 2 weeks of antibiotics.   Assessment & Plan:   Principal Problem:   Cellulitis of left foot Active Problems:   Thrombocytopenia ([place])   Essential hypertension   Uncontrolled type 2 diabetes mellitus with hyperglycemia ([facility])  Cellulitis of the left foot -MRI of left foot to rule out osteomyelitis -Antibiotics-IV vancomycin and Zosyn. Order Set used.  -Lower extremity ultrasound to rule out DVT-  Diabetes mellitus type 2 with hyperglycemia -A1c- -Lantus 10 units daily -Insulin sliding scale  Essential hypertension -Norvasc 10 mg daily, chlorthalidone 25 mg lisinopril 40 mg daily -IV hydralazine as needed  Hyperlipidemia -Lipitor 40 mg daily  Chronic thrombocytopenia, around baseline -No evidence of active bleeding.  Platelets 77K  GERD -PPI  Obstructive sleep apnea   DVT prophylaxis: SQ Hep Code Status: Full  Family Communication:    Status is: Inpatient  Remains inpatient appropriate because:IV treatments appropriate due to intensity of illness or inability to take PO   Dispo: The patient is from: Home              Anticipated d/c is to: Home              Anticipated d/c date is: 2 days              Patient currently is not medically stable to d/c.  Continue IV antibiotics, he has failed outpatient p.o. antibiotic treatment.  Currently undergoing evaluation for osteomyelitis.  MRI of left foot.    Subjective: Reporting of left toe pain with some drainage.  Very tender to touch.  Review of Systems Otherwise negative except as per HPI, including: General: Denies  fever, chills, night sweats or unintended weight loss. Resp: Denies cough, wheezing, shortness of breath. Cardiac: Denies chest pain, palpitations, orthopnea, paroxysmal nocturnal dyspnea. GI: Denies abdominal pain, nausea, vomiting, diarrhea or constipation GU: Denies dysuria, frequency, hesitancy or incontinence MS: Denies muscle aches, joint pain or swelling Neuro: Denies headache, neurologic deficits (focal weakness, numbness, tingling), abnormal gait Psych: Denies anxiety, depression, SI/HI/AVH Skin: Denies new rashes or lesions ID: Denies sick contacts, exotic exposures, travel  Examination:  General exam: Appears calm and comfortable  Respiratory system: Clear to auscultation. Respiratory effort normal. Cardiovascular system: S1 & S2 heard, RRR. No JVD, murmurs, rubs, gallops or clicks. No pedal edema. Gastrointestinal system: Abdomen is nondistended, soft and nontender. No organomegaly or masses felt. Normal bowel sounds heard. Central nervous system: Alert and oriented. No focal neurological deficits. Extremities: Left lower extremity great toe is very tender to touch.  Mild warmth. Skin: No rashes, lesions or ulcers Psychiatry: Judgement and insight appear normal. Mood & affect appropriate.     Objective: Vitals:   08/28/19 1859 08/28/19 2219 08/29/19 0129 08/29/19 0457  BP: (!) 170/109 (!) 160/89 (!) 173/90 (!) 155/99  Pulse:  73 74 71  Resp:  16 15 16   Temp:  98.2 F (36.8 C) 98.3 F (36.8 C) 98.7 F (37.1 C)  TempSrc:  Oral    SpO2:  100% 100% 100%  Weight:      Height:        Intake/Output Summary (Last  24 hours) at 08/29/2019 0828 Last data filed at 08/29/2019 0747 Gross per 24 hour  Intake 597.17 ml  Output 7545 ml  Net -6947.83 ml   Filed Weights   08/27/19 1859  Weight: (!) 147 kg     Data Reviewed:   CBC: Recent Labs  Lab 08/24/19 1525 08/27/19 2044 08/29/19 0310  WBC 5.3 7.3 6.7  NEUTROABS 2.8 6.1  --   HGB 12.3* 13.0 13.7  HCT 35.7*  39.0 40.2  MCV 94.2 95.4 95.5  PLT 73* 70* 77*   Basic Metabolic Panel: Recent Labs  Lab 08/24/19 1525 08/27/19 2044 08/29/19 0310  NA 138 138 136  K 4.6 4.0 3.6  CL 104 104 101  CO2 27 26 27   GLUCOSE 244* 218* 208*  BUN 32* 21* 15  CREATININE 1.54* 1.12 1.06  CALCIUM 9.1 9.0 9.0   GFR: Estimated Creatinine Clearance: 127.5 mL/min (by C-G formula based on SCr of 1.06 mg/dL). Liver Function Tests: Recent Labs  Lab 08/24/19 1525 08/27/19 2044  AST 18 25  ALT 31 36  ALKPHOS 91 58  BILITOT 0.7 0.9  PROT 6.7 6.9  ALBUMIN 4.1 4.3   No results for input(s): LIPASE, AMYLASE in the last 168 hours. No results for input(s): AMMONIA in the last 168 hours. Coagulation Profile: No results for input(s): INR, PROTIME in the last 168 hours. Cardiac Enzymes: No results for input(s): CKTOTAL, CKMB, CKMBINDEX, TROPONINI in the last 168 hours. BNP (last 3 results) No results for input(s): PROBNP in the last 8760 hours. HbA1C: No results for input(s): HGBA1C in the last 72 hours. CBG: Recent Labs  Lab 08/29/19 0003 08/29/19 0747  GLUCAP 194* 187*   Lipid Profile: No results for input(s): CHOL, HDL, LDLCALC, TRIG, CHOLHDL, LDLDIRECT in the last 72 hours. Thyroid Function Tests: No results for input(s): TSH, T4TOTAL, FREET4, T3FREE, THYROIDAB in the last 72 hours. Anemia Panel: No results for input(s): VITAMINB12, FOLATE, FERRITIN, TIBC, IRON, RETICCTPCT in the last 72 hours. Sepsis Labs: No results for input(s): PROCALCITON, LATICACIDVEN in the last 168 hours.  Recent Results (from the past 240 hour(s))  SARS Coronavirus 2 by RT PCR (hospital order, performed in [facility] hospital lab) Nasopharyngeal Nasopharyngeal Swab     Status: None   Collection Time: 08/28/19 12:16 AM   Specimen: Nasopharyngeal Swab  Result Value Ref Range Status   SARS Coronavirus 2 NEGATIVE NEGATIVE Final    Comment: (NOTE) SARS-CoV-2 target nucleic acids are NOT DETECTED. The SARS-CoV-2 RNA is  generally detectable in upper and lower respiratory specimens during the acute phase of infection. The lowest concentration of SARS-CoV-2 viral copies this assay can detect is 250 copies / mL. A negative result does not preclude SARS-CoV-2 infection and should not be used as the sole basis for treatment or other patient management decisions.  A negative result may occur with improper specimen collection / handling, submission of specimen other than nasopharyngeal swab, presence of viral mutation(s) within the areas targeted by this assay, and inadequate number of viral copies (<250 copies / mL). A negative result must be combined with clinical observations, patient history, and epidemiological information. Fact Sheet for Patients:   10/28/19 Fact Sheet for Healthcare Providers: BoilerBrush.com.cy This test is not yet approved or cleared  by the https://pope.com/ FDA and has been authorized for detection and/or diagnosis of SARS-CoV-2 by FDA under an Emergency Use Authorization (EUA).  This EUA will remain in effect (meaning this test can be used) for the duration of the  COVID-19 declaration under Section 564(b)(1) of the Act, 21 U.S.C. section 360bbb-3(b)(1), unless the authorization is terminated or revoked sooner. Performed at [facility], [address] [place], [place] [address]          Radiology Studies: DG Toe Great Left  Result Date: 08/27/2019 CLINICAL DATA:  49 year old male with possible osteomyelitis. EXAM: LEFT GREAT TOE COMPARISON:  Radiograph dated 08/24/2019. FINDINGS: There is no acute fracture or dislocation. The bones are osteopenic. No bone erosion or periosteal elevation to suggest osteomyelitis. There is mild diffuse subcutaneous edema. No radiopaque foreign object or soft tissue gas. IMPRESSION: No acute fracture or dislocation. No radiographic findings of osteomyelitis. Electronically Signed    By: Elgie Collard M.D.   On: 08/27/2019 23:24        Scheduled Meds: . amLODipine  10 mg Oral Daily  . atorvastatin  40 mg Oral Daily  . chlorthalidone  25 mg Oral Daily  . docusate sodium  100 mg Oral BID  . insulin aspart  0-9 Units Subcutaneous TID WC  . insulin glargine  10 Units Subcutaneous QHS  . lisinopril  40 mg Oral Daily  . pantoprazole  40 mg Oral Daily   Continuous Infusions: . sodium chloride 250 mL (08/29/19 0018)  . sodium chloride 250 mL (08/29/19 0028)  . piperacillin-tazobactam (ZOSYN)  IV 3.375 g (08/29/19 0022)  . vancomycin       LOS: 1 day   Time spent= 35 mins    Gaelyn Tukes Joline Maxcy, MD [facility]  If 7PM-7AM, please contact night-coverage  08/29/2019, 8:28 AM

## 2019-08-30 LAB — CBC
HCT: 41.4 % (ref 39.0–52.0)
Hemoglobin: 14 g/dL (ref 13.0–17.0)
MCH: 31.8 pg (ref 26.0–34.0)
MCHC: 33.8 g/dL (ref 30.0–36.0)
MCV: 94.1 fL (ref 80.0–100.0)
Platelets: 85 10*3/uL — ABNORMAL LOW (ref 150–400)
RBC: 4.4 MIL/uL (ref 4.22–5.81)
RDW: 12.6 % (ref 11.5–15.5)
WBC: 7.2 10*3/uL (ref 4.0–10.5)
nRBC: 0 % (ref 0.0–0.2)

## 2019-08-30 LAB — BASIC METABOLIC PANEL
Anion gap: 9 (ref 5–15)
BUN: 22 mg/dL — ABNORMAL HIGH (ref 6–20)
CO2: 25 mmol/L (ref 22–32)
Calcium: 9.2 mg/dL (ref 8.9–10.3)
Chloride: 100 mmol/L (ref 98–111)
Creatinine, Ser: 1.24 mg/dL (ref 0.61–1.24)
GFR calc Af Amer: >60 mL/min (ref >60)
GFR calc non Af Amer: >60 mL/min (ref >60)
Glucose, Bld: 198 mg/dL — ABNORMAL HIGH (ref 70–99)
Potassium: 3.7 mmol/L (ref 3.5–5.1)
Sodium: 134 mmol/L — ABNORMAL LOW (ref 135–145)

## 2019-08-30 LAB — MAGNESIUM: Magnesium: 1.7 mg/dL (ref 1.7–2.4)

## 2019-08-30 LAB — GLUCOSE, CAPILLARY
Glucose-Capillary: 177 mg/dL — ABNORMAL HIGH (ref 70–99)
Glucose-Capillary: 246 mg/dL — ABNORMAL HIGH (ref 70–99)

## 2019-08-30 MED ORDER — POLYETHYLENE GLYCOL 3350 17 G PO PACK: 17.0000 g | PACK | Freq: Every day | ORAL | 0 refills | Status: DC | PRN

## 2019-08-30 MED ORDER — MAGNESIUM SULFATE 2 GM/50ML IV SOLN
2.0000 g | Freq: Once | INTRAVENOUS | Status: AC
  Administered 2019-08-30: 2 g via INTRAVENOUS
  Filled 2019-08-30: qty 50

## 2019-08-30 MED ORDER — POTASSIUM CHLORIDE CRYS ER 20 MEQ PO TBCR
40.0000 meq | EXTENDED_RELEASE_TABLET | Freq: Once | ORAL | Status: AC
  Administered 2019-08-30: 40 meq via ORAL
  Filled 2019-08-30: qty 2

## 2019-08-30 MED ORDER — AMOXICILLIN-POT CLAVULANATE 875-125 MG PO TABS: 1.0000 | ORAL_TABLET | Freq: Two times a day (BID) | ORAL | 0 refills | Status: AC

## 2019-08-30 MED ORDER — SENNOSIDES-DOCUSATE SODIUM 8.6-50 MG PO TABS: 2.0000 | ORAL_TABLET | Freq: Every evening | ORAL | 0 refills | Status: DC | PRN

## 2019-08-30 MED ORDER — OXYCODONE HCL 5 MG PO TABS: 5.0000 mg | ORAL_TABLET | Freq: Four times a day (QID) | ORAL | 0 refills | Status: DC | PRN

## 2019-08-30 NOTE — Progress Notes (Signed)
Orthopedic Tech Progress Note Patient Details:  Cody [name] [birth year] [id number]  Patient ID: [name], male   DOB: 05-02-1970, 49 y.o.   MRN: [id number]   Kizzie Fantasia 08/30/2019, 11:15 AM Pt has own cam walker in room. New one not required. Pt agrees.

## 2019-08-30 NOTE — Discharge Summary (Signed)
Physician Discharge Summary  [facility] MMN:[id number] DOB: Mar 30, 1970 DOA: 08/27/2019  PCP: Plaza, Sh[place] date: 08/27/2019 Discharge date: 08/30/2019  Admitted From: Home Disposition: Home   Recommendations for Outpatient Follow-up:  1. Follow up with PCP in 1-2 weeks 2. Please obtain BMP/CBC in one week your next doctors visit.  3. Oral Augmentin prescribed 4. Oxycodone with bowel regimen prescribed 5. Follow-up patient orthopedic Dr Doran Durand 6. Cam walker - cont using it.    Discharge Condition: Stable CODE STATUS: Full code Diet recommendation: 2 g salt  Brief/Interim Summary: 48 HTN, DM2, HLD, sleep apnea, chronic thrombocytopenia presented from [place] for evaluation of worsening left lower extremity foot pain and great toe pain.  Had injury about 4 weeks ago.  X-ray showed distal phalanx fracture, failed 2 weeks of antibiotics.  MRI showed fractures left great toe is lower extremity Case discussed with orthopedic recommended outpatient follow-up in [facility].  Due to some surrounding cellulitis he was also prescribed Augmentin upon discharge.  Cellulitis of the left foot Left great toe fracture of the phalanx -MRI of left foo-shows fracture of the great toe but no evidence of osteomyelitis -Antibiotics-IV vancomycin and Zosyn.  Transition to oral Augmentin for 5 more days.  Follow-up patient orthopedic tom. Spoke with their PA Caryl Pina who will attempt to move his appt from 6/9 to 6/7 Physical therapy evaluation prior to discharge today  Diabetes mellitus type 2 with hyperglycemia -Resume home regimen  Essential hypertension -Norvasc 10 mg daily, chlorthalidone 25 mg lisinopril 40 mg daily  Hyperlipidemia -Lipitor 40 mg daily  Chronic thrombocytopenia, around baseline -No evidence of active bleeding.  Stable  GERD -PPI  Obstructive sleep apnea   Discharge Diagnoses:  Principal Problem:   Cellulitis of left foot Active Problems:    Thrombocytopenia ([place])   Essential hypertension   Uncontrolled type 2 diabetes mellitus with hyperglycemia ([place])    Consultations:  [facility], [facility]  Subjective: Still reporting of left lower extremity toe pain as expected but no other complaints.  Discharge Exam: Vitals:   08/29/19 2136 08/30/19 0552  BP: (!) 156/90 (!) 159/95  Pulse: 78 70  Resp: 17 18  Temp: 98.4 F (36.9 C) (!) 97.4 F (36.3 C)  SpO2: 100% 96%   Vitals:   08/29/19 1432 08/29/19 1612 08/29/19 2136 08/30/19 0552  BP: (!) 182/103 (!) 146/95 (!) 156/90 (!) 159/95  Pulse: 81 83 78 70  Resp: (!) 22  17 18   Temp: 98.3 F (36.8 C)  98.4 F (36.9 C) (!) 97.4 F (36.3 C)  TempSrc: Oral     SpO2: 100%  100% 96%  Weight:      Height:        General: Pt is alert, awake, not in acute distress Cardiovascular: RRR, S1/S2 +, no rubs, no gallops Respiratory: CTA bilaterally, no wheezing, no rhonchi Abdominal: Soft, NT, ND, bowel sounds + Extremities: no edema, no cyanosis  Discharge Instructions   Allergies as of 08/30/2019   No Known Allergies     Medication List    STOP taking these medications   cephALEXin 500 MG capsule Commonly known as: KEFLEX   doxycycline 100 MG capsule Commonly known as: VIBRAMYCIN     TAKE these medications   amLODipine 10 MG tablet Commonly known as: NORVASC Take 10 mg by mouth daily.   amoxicillin-clavulanate 875-125 MG tablet Commonly known as: Augmentin Take 1 tablet by mouth 2 (two) times daily for 5 days. One po bid x  7 days   aspirin EC 81 MG tablet Take 1 tablet (81 mg total) by mouth 2 (two) times daily.   atorvastatin 40 MG tablet Commonly known as: LIPITOR Take 40 mg by mouth daily.   bacitracin ointment Apply 1 application topically 2 (two) times daily.   chlorthalidone 25 MG tablet Commonly known as: HYGROTON Take 25 mg by mouth daily.   docusate sodium 100 MG capsule Commonly known as: Colace Take 1 capsule (100 mg total)  by mouth 2 (two) times daily. While taking narcotic pain medicine.   DULoxetine 30 MG capsule Commonly known as: CYMBALTA Take 30 mg by mouth daily.   glipiZIDE XL 10 MG 24 hr tablet Generic drug: glipiZIDE Take 10 mg by mouth daily.   lisinopril 40 MG tablet Commonly known as: ZESTRIL Take 40 mg by mouth daily.   metFORMIN 1000 MG tablet Commonly known as: GLUCOPHAGE Take 1,000 mg by mouth 2 (two) times daily with a meal.   naproxen 500 MG tablet Commonly known as: NAPROSYN Take 500 mg by mouth 2 (two) times daily with a meal.   ondansetron 4 MG disintegrating tablet Commonly known as: Zofran ODT Take 1 tablet (4 mg total) by mouth every 8 (eight) hours as needed for nausea or vomiting.   oxyCODONE 5 MG immediate release tablet Commonly known as: Oxy IR/ROXICODONE Take 1 tablet (5 mg total) by mouth every 6 (six) hours as needed for moderate pain or severe pain.   pantoprazole 40 MG tablet Commonly known as: PROTONIX Take 40 mg by mouth daily.   polyethylene glycol 17 g packet Commonly known as: MIRALAX / GLYCOLAX Take 17 g by mouth daily as needed for moderate constipation or severe constipation.   senna 8.6 MG Tabs tablet Commonly known as: SENOKOT Take 2 tablets (17.2 mg total) by mouth 2 (two) times daily.   senna-docusate 8.6-50 MG tablet Commonly known as: Senokot-S Take 2 tablets by mouth at bedtime as needed for mild constipation or moderate constipation.   Vitamin D3 125 MCG (5000 UT) Caps Take 5,000 Units by mouth daily.      Follow-up Information    [facility]. Schedule an appointment as soon as possible for a visit in 1 week(s).   Specialty: Internal Medicine Contact information: [address] [place] [address] [place] [address] [phone]        Toni ArthursHewitt, John, MD. Schedule an appointment as soon as possible for a visit in 1 day(s).   Specialty: Orthopedic Surgery Contact information: [address] [place] [place] [address] [phone]          No Known Allergies  You were cared for by a hospitalist during your hospital stay. If you have any questions about your discharge medications or the care you received while you were in the hospital after you are discharged, you can call the unit and asked to speak with the hospitalist on call if the hospitalist that took care of you is not available. Once you are discharged, your primary care physician will handle any further medical issues. Please note that no refills for any discharge medications will be authorized once you are discharged, as it is imperative that you return to your primary care physician (or establish a relationship with a primary care physician if you do not have one) for your aftercare needs so that they can reassess your need for medications and monitor your lab values.   Procedures/Studies: MR FOOT LEFT WO CONTRAST  Result Date:  08/29/2019 CLINICAL DATA:  Pain in the great toe. Osteomyelitis. Phalangeal fracture. EXAM: MRI OF THE LEFT FOOT WITHOUT CONTRAST TECHNIQUE: Multiplanar, multisequence MR imaging of the left toes was performed. No intravenous contrast was administered. COMPARISON:  Radiographs 08/27/2019 FINDINGS: Bones/Joint/Cartilage There is abnormal osseous edema in the distal phalanx great toe along with a fracture including a transverse metaphyseal component through the proximal metaphysis, and a suspected longitudinal component through the base of the proximal phalanx. Although healing response was suggested on radiography, the fracture plane is still visible on MR with low T1 and high T2 signal. The degree of marrow edema in the rest of the distal phalanx great toe could well be related to the on healed fracture, but the diffuse marrow edema is nonspecific with respect to presence of infection. The use of IV contrast would probably not help in this differentiation. Aside from degenerative findings, no other  abnormality of the toes is identified. There is mild spurring at the first MTP joint. No edema in the sesamoids. No compelling findings of turf toe. No other significant regional marrow edema. Ligaments No specific abnormality identified. Muscles and Tendons Unremarkable Soft tissues Mild subcutaneous edema in the dorsum of the foot. IMPRESSION: 1. The dominant finding is a fracture of the distal phalanx great toe with diffuse osseous edema. There is transverse metaphyseal component of the fracture and a longitudinal fracture through the base extending to the articular surface. The degree of marrow edema in the rest of the distal phalanx great toe could well be related to the fracture, but the diffuse marrow edema is nonspecific with respect to presence of infection. 2. Mild degenerative spurring at the first MTP joint. 3. Mild subcutaneous edema in the dorsum of the foot. Electronically Signed   By: Gaylyn Rong M.D.   On: 08/29/2019 14:34   DG Toe Great Left  Result Date: 08/27/2019 CLINICAL DATA:  49 year old male with possible osteomyelitis. EXAM: LEFT GREAT TOE COMPARISON:  Radiograph dated 08/24/2019. FINDINGS: There is no acute fracture or dislocation. The bones are osteopenic. No bone erosion or periosteal elevation to suggest osteomyelitis. There is mild diffuse subcutaneous edema. No radiopaque foreign object or soft tissue gas. IMPRESSION: No acute fracture or dislocation. No radiographic findings of osteomyelitis. Electronically Signed   By: Elgie Collard M.D.   On: 08/27/2019 23:24   DG Toe Great Left  Result Date: 08/24/2019 CLINICAL DATA:  49 year old male with history of diabetes and fracture of the left great toe presenting with drainage from the toe. EXAM: LEFT GREAT TOE COMPARISON:  Left foot radiograph dated 07/15/2019. FINDINGS: Interval near complete healing of the previously seen fracture of the distal phalanx of the great toe. No new fracture. The bones are osteopenic. There  is no dislocation. No periosteal elevation or bone erosion. There is diffuse subcutaneous edema. No soft tissue gas. Calcaneal fixation screw is partially visualized. IMPRESSION: 1. Near complete healing of the previously seen fracture of the distal phalanx of the great toe. No acute fracture. 2. No radiographic findings of osteomyelitis. Electronically Signed   By: Elgie Collard M.D.   On: 08/24/2019 15:28      The results of significant diagnostics from this hospitalization (including imaging, microbiology, ancillary and laboratory) are listed below for reference.     Microbiology: Recent Results (from the past 240 hour(s))  SARS Coronavirus 2 by RT PCR (hospital order, performed in [facility] hospital lab) Nasopharyngeal Nasopharyngeal Swab     Status: None   Collection Time: 08/28/19 12:16  AM   Specimen: Nasopharyngeal Swab  Result Value Ref Range Status   SARS Coronavirus 2 NEGATIVE NEGATIVE Final    Comment: (NOTE) SARS-CoV-2 target nucleic acids are NOT DETECTED. The SARS-CoV-2 RNA is generally detectable in upper and lower respiratory specimens during the acute phase of infection. The lowest concentration of SARS-CoV-2 viral copies this assay can detect is 250 copies / mL. A negative result does not preclude SARS-CoV-2 infection and should not be used as the sole basis for treatment or other patient management decisions.  A negative result may occur with improper specimen collection / handling, submission of specimen other than nasopharyngeal swab, presence of viral mutation(s) within the areas targeted by this assay, and inadequate number of viral copies (<250 copies / mL). A negative result must be combined with clinical observations, patient history, and epidemiological information. Fact Sheet for Patients:   BoilerBrush.com.cy Fact Sheet for Healthcare Providers: https://pope.com/ This test is not yet approved or cleared   by the [place] FDA and has been authorized for detection and/or diagnosis of SARS-CoV-2 by FDA under an Emergency Use Authorization (EUA).  This EUA will remain in effect (meaning this test can be used) for the duration of the COVID-19 declaration under Section 564(b)(1) of the Act, 21 U.S.C. section 360bbb-3(b)(1), unless the authorization is terminated or revoked sooner. Performed at [facility], [address] [place], [place] [address]      Labs: BNP (last 3 results) No results for input(s): BNP in the last 8760 hours. Basic Metabolic Panel: Recent Labs  Lab 08/24/19 1525 08/27/19 2044 08/29/19 0310 08/30/19 0347  NA 138 138 136 134*  K 4.6 4.0 3.6 3.7  CL 104 104 101 100  CO2 27 26 27 25   GLUCOSE 244* 218* 208* 198*  BUN 32* 21* 15 22*  CREATININE 1.54* 1.12 1.06 1.24  CALCIUM 9.1 9.0 9.0 9.2  MG  --   --   --  1.7   Liver Function Tests: Recent Labs  Lab 08/24/19 1525 08/27/19 2044  AST 18 25  ALT 31 36  ALKPHOS 91 58  BILITOT 0.7 0.9  PROT 6.7 6.9  ALBUMIN 4.1 4.3   No results for input(s): LIPASE, AMYLASE in the last 168 hours. No results for input(s): AMMONIA in the last 168 hours. CBC: Recent Labs  Lab 08/24/19 1525 08/27/19 2044 08/29/19 0310 08/30/19 0347  WBC 5.3 7.3 6.7 7.2  NEUTROABS 2.8 6.1  --   --   HGB 12.3* 13.0 13.7 14.0  HCT 35.7* 39.0 40.2 41.4  MCV 94.2 95.4 95.5 94.1  PLT 73* 70* 77* 85*   Cardiac Enzymes: No results for input(s): CKTOTAL, CKMB, CKMBINDEX, TROPONINI in the last 168 hours. BNP: Invalid input(s): POCBNP CBG: Recent Labs  Lab 08/29/19 1207 08/29/19 1715 08/29/19 2137 08/30/19 0727 08/30/19 1152  GLUCAP 224* 188* 247* 177* 246*   D-Dimer No results for input(s): DDIMER in the last 72 hours. Hgb A1c Recent Labs    08/29/19 0310  HGBA1C 8.4*   Lipid Profile No results for input(s): CHOL, HDL, LDLCALC, TRIG, CHOLHDL, LDLDIRECT in the last 72 hours. Thyroid function studies No  results for input(s): TSH, T4TOTAL, T3FREE, THYROIDAB in the last 72 hours.  Invalid input(s): FREET3 Anemia work up No results for input(s): VITAMINB12, FOLATE, FERRITIN, TIBC, IRON, RETICCTPCT in the last 72 hours. Urinalysis    Component Value Date/Time   COLORURINE YELLOW 08/30/2018 1707   APPEARANCEUR CLEAR 08/30/2018 1707   LABSPEC 1.025 08/30/2018  1707   PHURINE 6.0 08/30/2018 1707   GLUCOSEU NEGATIVE 08/30/2018 1707   HGBUR NEGATIVE 08/30/2018 1707   BILIRUBINUR NEGATIVE 08/30/2018 1707   KETONESUR NEGATIVE 08/30/2018 1707   PROTEINUR NEGATIVE 08/30/2018 1707   UROBILINOGEN 1.0 01/15/2011 2309   NITRITE NEGATIVE 08/30/2018 1707   LEUKOCYTESUR NEGATIVE 08/30/2018 1707   Sepsis Labs Invalid input(s): PROCALCITONIN,  WBC,  LACTICIDVEN Microbiology Recent Results (from the past 240 hour(s))  SARS Coronavirus 2 by RT PCR (hospital order, performed in [facility] hospital lab) Nasopharyngeal Nasopharyngeal Swab     Status: None   Collection Time: 08/28/19 12:16 AM   Specimen: Nasopharyngeal Swab  Result Value Ref Range Status   SARS Coronavirus 2 NEGATIVE NEGATIVE Final    Comment: (NOTE) SARS-CoV-2 target nucleic acids are NOT DETECTED. The SARS-CoV-2 RNA is generally detectable in upper and lower respiratory specimens during the acute phase of infection. The lowest concentration of SARS-CoV-2 viral copies this assay can detect is 250 copies / mL. A negative result does not preclude SARS-CoV-2 infection and should not be used as the sole basis for treatment or other patient management decisions.  A negative result may occur with improper specimen collection / handling, submission of specimen other than nasopharyngeal swab, presence of viral mutation(s) within the areas targeted by this assay, and inadequate number of viral copies (<250 copies / mL). A negative result must be combined with clinical observations, patient history, and epidemiological information. Fact Sheet  for Patients:   BoilerBrush.com.cy Fact Sheet for Healthcare Providers: https://pope.com/ This test is not yet approved or cleared  by the [place] FDA and has been authorized for detection and/or diagnosis of SARS-CoV-2 by FDA under an Emergency Use Authorization (EUA).  This EUA will remain in effect (meaning this test can be used) for the duration of the COVID-19 declaration under Section 564(b)(1) of the Act, 21 U.S.C. section 360bbb-3(b)(1), unless the authorization is terminated or revoked sooner. Performed at [facility], [address] [place], [place] [address]      Time coordinating discharge:  I have spent 35 minutes face to face with the patient and on the ward discussing the patients care, assessment, plan and disposition with other care givers. >50% of the time was devoted counseling the patient about the risks and benefits of treatment/Discharge disposition and coordinating care.   SIGNED:   Dimple Nanas, MD  [facility] 08/30/2019, 12:29 PM   If 7PM-7AM, please contact night-coverage

## 2019-08-30 NOTE — Evaluation (Signed)
Physical Therapy Evaluation Patient Details Name: Cody [name] MRN: [id number] DOB: 04/01/1970 Today's Date: 08/30/2019   History of Present Illness  Pt admitted wtih L LE cellulitis with noted L great toe pain and swelling and recent distal phalnax fx.  Pt with hx of DM and chronic thrombocytopenia  Clinical Impression  Pt admitted as above and presenting with functional mobility limitations 2* L foot pain exacerbated with WB and associated balance deficits.  Pt would benefit from use of wide RW for home.    Follow Up Recommendations No PT follow up    Equipment Recommendations  Rolling walker with 5" wheels(wide RW - pt 320 lbs)    Recommendations for Other Services       Precautions / Restrictions Precautions Precautions: Fall Required Braces or Orthoses: Other Brace Other Brace: cam walker on L Restrictions Weight Bearing Restrictions: No Other Position/Activity Restrictions: Pt reports WBAT with cam walker on.      Mobility  Bed Mobility Overal bed mobility: Modified Independent                Transfers Overall transfer level: Modified independent Equipment used: Rolling walker (2 wheeled)             General transfer comment: cue for LE managment and use of UEs to self assist  Ambulation/Gait Ambulation/Gait assistance: Supervision;Modified independent (Device/Increase time) Gait Distance (Feet): 150 Feet Assistive device: Rolling walker (2 wheeled) Gait Pattern/deviations: Step-to pattern;Step-through pattern;Decreased step length - left;Decreased stance time - right;Shuffle;Antalgic     General Gait Details: cues for posture, position from RW and sequence  Stairs            Wheelchair Mobility    Modified Rankin (Stroke Patients Only)       Balance Overall balance assessment: Mild deficits observed, not formally tested                                           Pertinent Vitals/Pain Pain Assessment: 0-10 Pain  Score: 6  Pain Location: L foot with activity Pain Descriptors / Indicators: Aching;Sore Pain Intervention(s): Limited activity within patient's tolerance;Monitored during session;Premedicated before session    [place] expects to be discharged to:: Private residence Living Arrangements: Other relatives Available Help at Discharge: Family Type of Home: House Home Access: Level entry     Home Layout: One level Home Equipment: Cane - quad      Prior Function Level of Independence: Independent;Independent with assistive device(s)         Comments: Pt IND prior to foot injury, states he hopped around with QC following injury     Hand Dominance        Extremity/Trunk Assessment   Upper Extremity Assessment Upper Extremity Assessment: Overall WFL for tasks assessed    Lower Extremity Assessment Lower Extremity Assessment: LLE deficits/detail LLE Deficits / Details: Ankle/foot not assessed 2* pain; Hip/knee WFL LLE: Unable to fully assess due to pain    Cervical / Trunk Assessment Cervical / Trunk Assessment: Normal  Communication   Communication: No difficulties  Cognition Arousal/Alertness: Awake/alert Behavior During Therapy: WFL for tasks assessed/performed Overall Cognitive Status: Within Functional Limits for tasks assessed  General Comments      Exercises     Assessment/Plan    PT Assessment Patent does not need any further PT services  PT Problem List         PT Treatment Interventions      PT Goals (Current goals can be found in the Care Plan section)  Acute Rehab PT Goals Patient Stated Goal: less pain and regain full use of L LE PT Goal Formulation: All assessment and education complete, DC therapy    Frequency     Barriers to discharge        Co-evaluation               AM-PAC PT "6 Clicks" Mobility  Outcome Measure Help needed turning from your back to  your side while in a flat bed without using bedrails?: None Help needed moving from lying on your back to sitting on the side of a flat bed without using bedrails?: None Help needed moving to and from a bed to a chair (including a wheelchair)?: A Little Help needed standing up from a chair using your arms (e.g., wheelchair or bedside chair)?: None Help needed to walk in hospital room?: A Little Help needed climbing 3-5 steps with a railing? : A Little 6 Click Score: 21    End of Session Equipment Utilized During Treatment: Gait belt Activity Tolerance: Patient tolerated treatment well;Patient limited by pain Patient left: in bed;with call bell/phone within reach Nurse Communication: Mobility status PT Visit Diagnosis: Unsteadiness on feet (R26.81);Difficulty in walking, not elsewhere classified (R26.2);Pain Pain - Right/Left: Left Pain - part of body: Ankle and joints of foot    Time: 1250-1312 PT Time Calculation (min) (ACUTE ONLY): 22 min   Charges:   PT Evaluation $PT Eval Low Complexity: 1 Low          Mauro Kaufmann PT Acute Rehabilitation Services Pager [phone] Office [phone]   Loida Calamia 08/30/2019, 3:42 PM

## 2019-08-30 NOTE — Progress Notes (Signed)
Discharge instructions discussed with patient, verbalized agreement and understanding 

## 2019-08-30 NOTE — TOC Initial Note (Signed)
Transition of Care [facility]) - Initial/Assessment Note    Patient Details  Name: Cody [name] MRN: [id number] Date of Birth: 09-23-1970  Transition of Care (TOC) CM/SW Contact:    Armanda Heritage, RN Phone Number: 08/30/2019, 1:38 PM  Clinical Narrative:  [employer] to deliver rolling walker (wide) to bedside for home use.                  Expected Discharge Plan: Home/Self Care Barriers to Discharge: No Barriers Identified   Patient Goals and CMS Choice Patient states their goals for this hospitalization and ongoing recovery are:: to go home CMS Medicare.gov Compare Post Acute Care list provided to:: Patient Choice offered to / list presented to : Patient  Expected Discharge Plan and Services Expected Discharge Plan: Home/Self Care   Discharge Planning Services: CM Consult Post Acute Care Choice: Durable Medical Equipment Living arrangements for the past 2 months: Single Family Home Expected Discharge Date: 08/30/19               DME Arranged: Dan Humphreys rolling DME Agency: [employer] Date DME Agency Contacted: 08/30/19 Time DME Agency Contacted: [phone] Representative spoke with at DME Agency: [facility] Arranged: NA HH Agency: NA        Prior Living Arrangements/Services Living arrangements for the past 2 months: Single Family Home   Patient language and need for interpreter reviewed:: Yes Do you feel safe going back to the place where you live?: Yes      Need for Family Participation in Patient Care: No (Comment) Care giver support system in place?: Yes (comment)   Criminal Activity/Legal Involvement Pertinent to Current Situation/Hospitalization: No - Comment as needed  Activities of Daily Living Home Assistive Devices/Equipment: Cane (specify quad or straight) ADL Screening (condition at time of admission) Patient's cognitive ability adequate to safely complete daily activities?: Yes Is the patient deaf or have difficulty hearing?: No Does the patient have difficulty  seeing, even when wearing glasses/contacts?: No Does the patient have difficulty concentrating, remembering, or making decisions?: No Patient able to express need for assistance with ADLs?: Yes Does the patient have difficulty dressing or bathing?: No Independently performs ADLs?: Yes (appropriate for developmental age) Does the patient have difficulty walking or climbing stairs?: Yes Weakness of Legs: Left Weakness of Arms/Hands: None  Permission Sought/Granted                  Emotional Assessment           Psych Involvement: No (comment)  Admission diagnosis:  Cellulitis of left lower extremity [L03.116] Cellulitis of right lower leg [L03.115] Cellulitis of left foot [L03.116] Patient Active Problem List   Diagnosis Date Noted  . Cellulitis of right lower leg 08/28/2019  . Uncontrolled type 2 diabetes mellitus with hyperglycemia (HCC) 08/28/2019  . Cellulitis of left foot 08/28/2019  . New onset type 2 diabetes mellitus (HCC) 12/06/2015  . Essential hypertension 12/05/2015  . GERD (gastroesophageal reflux disease) 12/05/2015  . Cellulitis of leg, right 12/04/2015  . AKI (acute kidney injury) (HCC) 12/04/2015  . Thrombocytopenia (HCC) 12/04/2015   PCP:  [facility]:   [facility] [address] - [place], [place] - [address] [place] [address] Phone: [phone] Fax: [phone]     Social Determinants of Health (SDOH) Interventions    Readmission Risk Interventions No flowsheet data found.

## 2019-08-30 NOTE — Evaluation (Signed)
Occupational Therapy Evaluation Patient Details Name: Cody [name] MRN: [id number] DOB: [birth year] Today's Date: 08/30/2019    History of Present Illness Pt admitted wtih L LE cellulitis with noted L great toe pain and swelling and recent distal phalnax fx.  Pt with hx of DM and chronic thrombocytopenia   Clinical Impression   MR. Cody [name] is a 49 year old man admitted to hospital with LLE cellulitis. On evaluation patient demonstrates modified independence with CAM boot and RW. Patient able to perform all ADLs. Discussed need for a shower chair in the future for safety when home environment issues resolved.  No OT needs at this time.    Follow Up Recommendations  No OT follow up    Equipment Recommendations  Tub/shower seat    Recommendations for Other Services       Precautions / Restrictions Precautions Precautions: Fall Required Braces or Orthoses: Other Brace Other Brace: cam walker on L Restrictions Weight Bearing Restrictions: No LLE Weight Bearing: Non weight bearing Other Position/Activity Restrictions: Pt reports WBAT with cam walker on.      Mobility Bed Mobility Overal bed mobility: Independent                Transfers Overall transfer level: Modified independent Equipment used: Rolling walker (2 wheeled)             General transfer comment: independent with RW.    Balance Overall balance assessment: No apparent balance deficits (not formally assessed)                                         ADL either performed or assessed with clinical judgement   ADL Overall ADL's : Modified independent                                       General ADL Comments: MOdified independent with RW and increased time. Patient independently donned clothing and performed toileting without assistance.     Vision   Vision Assessment?: No apparent visual deficits     Perception     Praxis      Pertinent Vitals/Pain Pain  Assessment: Faces Pain Score: 6  Faces Pain Scale: Hurts little more Pain Location: L foot with activity Pain Descriptors / Indicators: Aching;Sore Pain Intervention(s): Limited activity within patient's tolerance;Monitored during session;Premedicated before session     Hand Dominance     Extremity/Trunk Assessment Upper Extremity Assessment Upper Extremity Assessment: Overall WFL for tasks assessed   Lower Extremity Assessment Lower Extremity Assessment: Defer to PT evaluation LLE Deficits / Details: Ankle/foot not assessed 2* pain; Hip/knee WFL LLE: Unable to fully assess due to pain   Cervical / Trunk Assessment Cervical / Trunk Assessment: Normal   Communication Communication Communication: No difficulties   Cognition Arousal/Alertness: Awake/alert Behavior During Therapy: WFL for tasks assessed/performed Overall Cognitive Status: Within Functional Limits for tasks assessed                                     General Comments       Exercises     Shoulder Instructions      Home Living Family/patient expects to be discharged to:: Private residence Living Arrangements: Other relatives Available Help at Discharge: Family  Type of Home: House Home Access: Level entry     Home Layout: One level               Home Equipment: Cane - quad          Prior Functioning/Environment Level of Independence: Independent;Independent with assistive device(s)        Comments: Pt IND prior to foot injury, states he hopped around with QC following injury        OT Problem List: Pain      OT Treatment/Interventions:      OT Goals(Current goals can be found in the care plan section) Acute Rehab OT Goals Patient Stated Goal: less pain and regain full use of L LE OT Goal Formulation: All assessment and education complete, DC therapy  OT Frequency:     Barriers to D/C:            Co-evaluation              AM-PAC OT "6 Clicks" Daily  Activity     Outcome Measure Help from another person eating meals?: None Help from another person taking care of personal grooming?: None Help from another person toileting, which includes using toliet, bedpan, or urinal?: None Help from another person bathing (including washing, rinsing, drying)?: None Help from another person to put on and taking off regular upper body clothing?: None Help from another person to put on and taking off regular lower body clothing?: None 6 Click Score: 24   End of Session Equipment Utilized During Treatment: Rolling walker Nurse Communication: (patient ready to go home.)  Activity Tolerance: Patient tolerated treatment well Patient left: in bed  OT Visit Diagnosis: Unsteadiness on feet (R26.81);Pain Pain - Right/Left: Left Pain - part of body: Ankle and joints of foot                Time: 1400-1419 OT Time Calculation (min): 19 min Charges:  OT General Charges $OT Visit: 1 Visit OT Evaluation $OT Eval Low Complexity: 1 Low  Timonthy Hovater, OTR/L Acute Care Rehab Services  Office [phone]   Kelli Churn 08/30/2019, 4:45 PM

## 2019-11-25 ENCOUNTER — Other Ambulatory Visit: Payer: Self-pay

## 2019-11-25 ENCOUNTER — Observation Stay (HOSPITAL_BASED_OUTPATIENT_CLINIC_OR_DEPARTMENT_OTHER)
Admission: EM | Admit: 2019-11-25 | Discharge: 2019-11-25 | Discharge status: Against Medical Advice | Payer: PRIVATE HEALTH INSURANCE | Attending: Internal Medicine | Admitting: Internal Medicine

## 2019-11-25 ENCOUNTER — Emergency Department (HOSPITAL_BASED_OUTPATIENT_CLINIC_OR_DEPARTMENT_OTHER): Payer: PRIVATE HEALTH INSURANCE

## 2019-11-25 ENCOUNTER — Encounter (HOSPITAL_BASED_OUTPATIENT_CLINIC_OR_DEPARTMENT_OTHER): Payer: Self-pay | Admitting: *Deleted

## 2019-11-25 DIAGNOSIS — Z20822 Contact with and (suspected) exposure to covid-19: Secondary | ICD-10-CM | POA: Insufficient documentation

## 2019-11-25 DIAGNOSIS — Z23 Encounter for immunization: Secondary | ICD-10-CM | POA: Diagnosis not present

## 2019-11-25 DIAGNOSIS — I10 Essential (primary) hypertension: Secondary | ICD-10-CM | POA: Diagnosis not present

## 2019-11-25 DIAGNOSIS — Z79899 Other long term (current) drug therapy: Secondary | ICD-10-CM | POA: Diagnosis not present

## 2019-11-25 DIAGNOSIS — N50812 Left testicular pain: Secondary | ICD-10-CM | POA: Diagnosis present

## 2019-11-25 DIAGNOSIS — Z7982 Long term (current) use of aspirin: Secondary | ICD-10-CM | POA: Diagnosis not present

## 2019-11-25 DIAGNOSIS — L03314 Cellulitis of groin: Principal | ICD-10-CM | POA: Insufficient documentation

## 2019-11-25 DIAGNOSIS — D696 Thrombocytopenia, unspecified: Secondary | ICD-10-CM

## 2019-11-25 DIAGNOSIS — Z87891 Personal history of nicotine dependence: Secondary | ICD-10-CM | POA: Insufficient documentation

## 2019-11-25 DIAGNOSIS — E119 Type 2 diabetes mellitus without complications: Secondary | ICD-10-CM | POA: Insufficient documentation

## 2019-11-25 DIAGNOSIS — Z7984 Long term (current) use of oral hypoglycemic drugs: Secondary | ICD-10-CM | POA: Insufficient documentation

## 2019-11-25 DIAGNOSIS — L03818 Cellulitis of other sites: Secondary | ICD-10-CM

## 2019-11-25 DIAGNOSIS — N492 Inflammatory disorders of scrotum: Secondary | ICD-10-CM

## 2019-11-25 DIAGNOSIS — L0390 Cellulitis, unspecified: Secondary | ICD-10-CM | POA: Diagnosis present

## 2019-11-25 LAB — CBC WITH DIFFERENTIAL/PLATELET
Abs Immature Granulocytes: 0.07 10*3/uL (ref 0.00–0.07)
Basophils Absolute: 0 10*3/uL (ref 0.0–0.1)
Basophils Relative: 1 %
Eosinophils Absolute: 0.2 10*3/uL (ref 0.0–0.5)
Eosinophils Relative: 2 %
HCT: 41.4 % (ref 39.0–52.0)
Hemoglobin: 14.1 g/dL (ref 13.0–17.0)
Immature Granulocytes: 1 %
Lymphocytes Relative: 13 %
Lymphs Abs: 1.1 10*3/uL (ref 0.7–4.0)
MCH: 31.7 pg (ref 26.0–34.0)
MCHC: 34.1 g/dL (ref 30.0–36.0)
MCV: 93 fL (ref 80.0–100.0)
Monocytes Absolute: 0.5 10*3/uL (ref 0.1–1.0)
Monocytes Relative: 6 %
Neutro Abs: 6.8 10*3/uL (ref 1.7–7.7)
Neutrophils Relative %: 77 %
Platelets: 79 10*3/uL — ABNORMAL LOW (ref 150–400)
RBC: 4.45 MIL/uL (ref 4.22–5.81)
RDW: 12 % (ref 11.5–15.5)
Smear Review: DECREASED
WBC: 8.6 10*3/uL (ref 4.0–10.5)
nRBC: 0 % (ref 0.0–0.2)

## 2019-11-25 LAB — COMPREHENSIVE METABOLIC PANEL
ALT: 28 U/L (ref 0–44)
AST: 19 U/L (ref 15–41)
Albumin: 4.4 g/dL (ref 3.5–5.0)
Alkaline Phosphatase: 61 U/L (ref 38–126)
Anion gap: 9 (ref 5–15)
BUN: 19 mg/dL (ref 6–20)
CO2: 24 mmol/L (ref 22–32)
Calcium: 9.2 mg/dL (ref 8.9–10.3)
Chloride: 103 mmol/L (ref 98–111)
Creatinine, Ser: 1.25 mg/dL — ABNORMAL HIGH (ref 0.61–1.24)
GFR calc Af Amer: >60 mL/min (ref >60)
GFR calc non Af Amer: >60 mL/min (ref >60)
Glucose, Bld: 268 mg/dL — ABNORMAL HIGH (ref 70–99)
Potassium: 4.2 mmol/L (ref 3.5–5.1)
Sodium: 136 mmol/L (ref 135–145)
Total Bilirubin: 0.6 mg/dL (ref 0.3–1.2)
Total Protein: 6.7 g/dL (ref 6.5–8.1)

## 2019-11-25 LAB — URINALYSIS, ROUTINE W REFLEX MICROSCOPIC
Bilirubin Urine: NEGATIVE
Glucose, UA: >=500 mg/dL — AB
Hgb urine dipstick: NEGATIVE
Ketones, ur: NEGATIVE mg/dL
Leukocytes,Ua: NEGATIVE
Nitrite: NEGATIVE
Protein, ur: NEGATIVE mg/dL
Specific Gravity, Urine: 1.025 (ref 1.005–1.030)
pH: 5 (ref 5.0–8.0)

## 2019-11-25 LAB — URINALYSIS, MICROSCOPIC (REFLEX)

## 2019-11-25 LAB — SARS CORONAVIRUS 2 BY RT PCR (HOSPITAL ORDER, PERFORMED IN ~~LOC~~ HOSPITAL LAB): SARS Coronavirus 2: NEGATIVE

## 2019-11-25 MED ORDER — MORPHINE SULFATE (PF) 4 MG/ML IV SOLN
8.0000 mg | Freq: Once | INTRAVENOUS | Status: AC
  Administered 2019-11-25: 8 mg via INTRAVENOUS
  Filled 2019-11-25: qty 2

## 2019-11-25 MED ORDER — TETANUS-DIPHTH-ACELL PERTUSSIS 5-2.5-18.5 LF-MCG/0.5 IM SUSP
0.5000 mL | Freq: Once | INTRAMUSCULAR | Status: AC
  Administered 2019-11-25: 0.5 mL via INTRAMUSCULAR
  Filled 2019-11-25: qty 0.5

## 2019-11-25 MED ORDER — ACETAMINOPHEN 325 MG PO TABS: 650.0000 mg | ORAL_TABLET | Freq: Four times a day (QID) | ORAL | Status: DC | PRN

## 2019-11-25 MED ORDER — FENTANYL CITRATE (PF) 100 MCG/2ML IJ SOLN
50.0000 ug | Freq: Once | INTRAMUSCULAR | Status: AC
  Administered 2019-11-25: 50 ug via INTRAVENOUS
  Filled 2019-11-25: qty 2

## 2019-11-25 MED ORDER — IOHEXOL 300 MG/ML  SOLN
100.0000 mL | Freq: Once | INTRAMUSCULAR | Status: AC | PRN
  Administered 2019-11-25: 100 mL via INTRAVENOUS

## 2019-11-25 MED ORDER — VANCOMYCIN HCL IN DEXTROSE 1-5 GM/200ML-% IV SOLN: 1000.0000 mg | Freq: Two times a day (BID) | INTRAVENOUS | Status: DC

## 2019-11-25 MED ORDER — VANCOMYCIN HCL 1500 MG/300ML IV SOLN
1500.0000 mg | Freq: Once | INTRAVENOUS | Status: DC
  Filled 2019-11-25: qty 300

## 2019-11-25 MED ORDER — PIPERACILLIN-TAZOBACTAM 3.375 G IVPB 30 MIN
3.3750 g | Freq: Once | INTRAVENOUS | Status: AC
  Administered 2019-11-25: 3.375 g via INTRAVENOUS
  Filled 2019-11-25 (×2): qty 50

## 2019-11-25 MED ORDER — VANCOMYCIN HCL IN DEXTROSE 1-5 GM/200ML-% IV SOLN: 1000.0000 mg | Freq: Once | INTRAVENOUS | Status: DC

## 2019-11-25 MED ORDER — VANCOMYCIN HCL IN DEXTROSE 1-5 GM/200ML-% IV SOLN
1000.0000 mg | Freq: Once | INTRAVENOUS | Status: AC
  Administered 2019-11-25: 1000 mg via INTRAVENOUS
  Filled 2019-11-25: qty 200

## 2019-11-25 MED ORDER — HYDROMORPHONE HCL 1 MG/ML IJ SOLN
1.0000 mg | Freq: Once | INTRAMUSCULAR | Status: AC
  Administered 2019-11-25: 1 mg via INTRAVENOUS
  Filled 2019-11-25: qty 1

## 2019-11-25 MED ORDER — ONDANSETRON HCL 4 MG/2ML IJ SOLN
4.0000 mg | Freq: Once | INTRAMUSCULAR | Status: AC
  Administered 2019-11-25: 4 mg via INTRAVENOUS
  Filled 2019-11-25: qty 2

## 2019-11-25 MED ORDER — LIDOCAINE HCL (PF) 1 % IJ SOLN
5.0000 mL | Freq: Once | INTRAMUSCULAR | Status: AC
  Administered 2019-11-25: 5 mL
  Filled 2019-11-25: qty 30

## 2019-11-25 MED ORDER — SODIUM CHLORIDE 0.9 % IV SOLN: INTRAVENOUS | Status: DC

## 2019-11-25 NOTE — ED Provider Notes (Signed)
Pt transferred to [facility] ED from [facility].  Pt has had left groin pain for 3 days.  He is a diabetic with a last A1c 8.2 on 6/1.  He had a CT abd/pelvis which showed scrotal cellulitis with phlegmon.  He has pretty severe pain still despite pain meds.  Dr. Nicanor Alcon called urology and hospitalist while at [facility].  They recommended ED to ED transfer.  Pt did receive Zosyn and vancomycin at [facility].  Pt d/w Dr. Sherron Monday (urology) who will see pt.  Pt d/w Dr. Dairl Ponder (triad) for admission.   Jacalyn Lefevre, MD 11/25/19 [phone]

## 2019-11-25 NOTE — ED Triage Notes (Signed)
Pt c/o left testicle pain  And swelling x 3 days. ? abscess

## 2019-11-25 NOTE — ED Provider Notes (Signed)
[facility] EMERGENCY DEPARTMENT Provider Note   CSN: [id number] Arrival date & time: 11/25/19  0005     History Chief Complaint  Patient presents with  . Testicle Pain    Cody [name] is a 49 y.o. male.   Groin Pain This is a new problem. The current episode started more than 2 days ago. The problem occurs constantly. The problem has not changed since onset.Pertinent negatives include no chest pain, no abdominal pain, no headaches and no shortness of breath. Nothing aggravates the symptoms. Nothing relieves the symptoms. He has tried nothing for the symptoms. The treatment provided no relief.  Patient has swelling and pain of the thighs, scrotum and groin area.  No f/c/r.  No urinary symptoms.      Past Medical History:  Diagnosis Date  . Acid reflux   . Diabetes mellitus without complication (HCC)   . Hypertension   . Mixed hyperlipidemia   . Sleep apnea     Patient Active Problem List   Diagnosis Date Noted  . Cellulitis of right lower leg 08/28/2019  . Uncontrolled type 2 diabetes mellitus with hyperglycemia (HCC) 08/28/2019  . Cellulitis of left foot 08/28/2019  . New onset type 2 diabetes mellitus (HCC) 12/06/2015  . Essential hypertension 12/05/2015  . GERD (gastroesophageal reflux disease) 12/05/2015  . Cellulitis of leg, right 12/04/2015  . AKI (acute kidney injury) (HCC) 12/04/2015  . Thrombocytopenia (HCC) 12/04/2015    Past Surgical History:  Procedure Laterality Date  . FOOT ARTHRODESIS Left 09/25/2018   Procedure: Left subtalar joint arthrodesis and peroneal tendon repair;  Surgeon: Toni ArthursHewitt, John, MD;  Location: [facility];  Service: Orthopedics;  Laterality: Left;  . KNEE SURGERY         Family History  Problem Relation Age of Onset  . Hypertension Other     Social History   Tobacco Use  . Smoking status: Former Games developermoker  . Smokeless tobacco: Never Used  Vaping Use  . Vaping Use: Never used  Substance Use Topics  .  Alcohol use: No  . Drug use: No    Home Medications Prior to Admission medications   Medication Sig Start Date End Date Taking? Authorizing Provider  amLODipine (NORVASC) 10 MG tablet Take 10 mg by mouth daily.  04/04/18   [provider]  aspirin EC 81 MG tablet Take 1 tablet (81 mg total) by mouth 2 (two) times daily. 09/25/18   Jacinta Shoellis, Justin Pike, PA-C  atorvastatin (LIPITOR) 40 MG tablet Take 40 mg by mouth daily.  05/30/18   [provider]  bacitracin ointment Apply 1 application topically 2 (two) times daily. 06/24/19   Derwood KaplanNanavati, Ankit, MD  chlorthalidone (HYGROTON) 25 MG tablet Take 25 mg by mouth daily.    [provider]  Cholecalciferol (VITAMIN D3) 125 MCG (5000 UT) CAPS Take 5,000 Units by mouth daily.  04/30/19   [provider]  docusate sodium (COLACE) 100 MG capsule Take 1 capsule (100 mg total) by mouth 2 (two) times daily. While taking narcotic pain medicine. 09/25/18   Jacinta Shoellis, Justin Pike, PA-C  DULoxetine (CYMBALTA) 30 MG capsule Take 30 mg by mouth daily. 04/27/19   [provider]  GLIPIZIDE XL 10 MG 24 hr tablet Take 10 mg by mouth daily. 06/18/19   [provider]  lisinopril (ZESTRIL) 40 MG tablet Take 40 mg by mouth daily.    [provider]  metFORMIN (GLUCOPHAGE) 1000 MG tablet Take 1,000 mg by mouth 2 (two) times  daily with a meal.    [provider]  naproxen (NAPROSYN) 500 MG tablet Take 500 mg by mouth 2 (two) times daily with a meal.     [provider]  ondansetron (ZOFRAN ODT) 4 MG disintegrating tablet Take 1 tablet (4 mg total) by mouth every 8 (eight) hours as needed for nausea or vomiting. Patient not taking: Reported on 08/28/2019 08/30/18   Albrizze, Yvonna Alanis E, PA-C  oxyCODONE (OXY IR/ROXICODONE) 5 MG immediate release tablet Take 1 tablet (5 mg total) by mouth every 6 (six) hours as needed for moderate pain or severe pain. 08/30/19   Amin, Loura Halt, MD  pantoprazole (PROTONIX) 40 MG  tablet Take 40 mg by mouth daily. 05/19/19   [provider]  polyethylene glycol (MIRALAX / GLYCOLAX) 17 g packet Take 17 g by mouth daily as needed for moderate constipation or severe constipation. 08/30/19   Amin, Loura Halt, MD  senna (SENOKOT) 8.6 MG TABS tablet Take 2 tablets (17.2 mg total) by mouth 2 (two) times daily. 09/25/18   Jacinta Shoe, PA-C  senna-docusate (SENOKOT-S) 8.6-50 MG tablet Take 2 tablets by mouth at bedtime as needed for mild constipation or moderate constipation. 08/30/19   Dimple Nanas, MD    Allergies    Patient has no known allergies.  Review of Systems   Review of Systems  Constitutional: Negative for fever.  HENT: Negative for congestion.   Eyes: Negative for visual disturbance.  Respiratory: Negative for shortness of breath.   Cardiovascular: Negative for chest pain.  Gastrointestinal: Negative for abdominal pain.  Genitourinary: Positive for penile swelling and scrotal swelling.       Groin and thigh swelling   Musculoskeletal: Negative.  Negative for back pain.  Skin: Negative for rash and wound.  Neurological: Negative for headaches.  Psychiatric/Behavioral: Negative.  Negative for agitation.  All other systems reviewed and are negative.   Physical Exam Updated Vital Signs BP (!) 171/100 (BP Location: Left Arm)   Pulse 62   Temp 98.9 F (37.2 C) (Oral)   Resp 18   Ht 6\' 2"  (1.88 m)   Wt (!) 137 kg   SpO2 100%   BMI 38.77 kg/m   Physical Exam Vitals and nursing note reviewed. Exam conducted with a chaperone present.  Constitutional:      General: He is not in acute distress.    Appearance: Normal appearance.  HENT:     Head: Normocephalic and atraumatic.     Nose: Nose normal.  Eyes:     Conjunctiva/sclera: Conjunctivae normal.     Pupils: Pupils are equal, round, and reactive to light.  Cardiovascular:     Rate and Rhythm: Normal rate and regular rhythm.     Pulses: Normal pulses.     Heart sounds: Normal heart  sounds.  Pulmonary:     Effort: Pulmonary effort is normal.     Breath sounds: Normal breath sounds.  Abdominal:     General: Abdomen is flat. Bowel sounds are normal.     Palpations: Abdomen is soft.     Tenderness: There is no abdominal tenderness. There is no guarding or rebound.  Genitourinary:    Testes: Cremasteric reflex is present.        Right: Mass or tenderness not present. Cremasteric reflex is present.         Left: Mass or tenderness not present. Cremasteric reflex is present.        Comments: Enlarged tender scrotum, with warmth, groin  lymph nodes that are swollen and tender B also has warmth of the medial thighs.   Musculoskeletal:        General: Normal range of motion.     Cervical back: Normal range of motion and neck supple.  Lymphadenopathy:     Lower Body: Right inguinal adenopathy present. Left inguinal adenopathy present.  Skin:    General: Skin is warm and dry.     Capillary Refill: Capillary refill takes less than 2 seconds.     Coloration: Skin is not pale.  Neurological:     General: No focal deficit present.     Mental Status: He is alert.     Deep Tendon Reflexes: Reflexes normal.  Psychiatric:        Mood and Affect: Mood normal.        Behavior: Behavior normal.     ED Results / Procedures / Treatments   Labs (all labs ordered are listed, but only abnormal results are displayed) Results for orders placed or performed during the hospital encounter of 11/25/19  Urinalysis, Routine w reflex microscopic Urine, Clean Catch  Result Value Ref Range   Color, Urine YELLOW YELLOW   APPearance CLEAR CLEAR   Specific Gravity, Urine 1.025 1.005 - 1.030   pH 5.0 5.0 - 8.0   Glucose, UA >=500 (A) NEGATIVE mg/dL   Hgb urine dipstick NEGATIVE NEGATIVE   Bilirubin Urine NEGATIVE NEGATIVE   Ketones, ur NEGATIVE NEGATIVE mg/dL   Protein, ur NEGATIVE NEGATIVE mg/dL   Nitrite NEGATIVE NEGATIVE   Leukocytes,Ua NEGATIVE NEGATIVE  Urinalysis, Microscopic  (reflex)  Result Value Ref Range   RBC / HPF 0-5 0 - 5 RBC/hpf   WBC, UA 0-5 0 - 5 WBC/hpf   Bacteria, UA RARE (A) NONE SEEN   Squamous Epithelial / LPF 0-5 0 - 5  CBC with Differential/Platelet  Result Value Ref Range   WBC 8.6 4.0 - 10.5 K/uL   RBC 4.45 4.22 - 5.81 MIL/uL   Hemoglobin 14.1 13.0 - 17.0 g/dL   HCT 44.3 39 - 52 %   MCV 93.0 80.0 - 100.0 fL   MCH 31.7 26.0 - 34.0 pg   MCHC 34.1 30.0 - 36.0 g/dL   RDW 15.4 00.8 - 67.6 %   Platelets 79 (L) 150 - 400 K/uL   nRBC 0.0 0.0 - 0.2 %   Neutrophils Relative % 77 %   Neutro Abs 6.8 1.7 - 7.7 K/uL   Lymphocytes Relative 13 %   Lymphs Abs 1.1 0.7 - 4.0 K/uL   Monocytes Relative 6 %   Monocytes Absolute 0.5 0 - 1 K/uL   Eosinophils Relative 2 %   Eosinophils Absolute 0.2 0 - 0 K/uL   Basophils Relative 1 %   Basophils Absolute 0.0 0 - 0 K/uL   WBC Morphology MORPHOLOGY UNREMARKABLE    RBC Morphology MORPHOLOGY UNREMARKABLE    Smear Review PLATELETS APPEAR DECREASED    Immature Granulocytes 1 %   Abs Immature Granulocytes 0.07 0.00 - 0.07 K/uL   Giant PLTs PRESENT   Comprehensive metabolic panel  Result Value Ref Range   Sodium 136 135 - 145 mmol/L   Potassium 4.2 3.5 - 5.1 mmol/L   Chloride 103 98 - 111 mmol/L   CO2 24 22 - 32 mmol/L   Glucose, Bld 268 (H) 70 - 99 mg/dL   BUN 19 6 - 20 mg/dL   Creatinine, Ser 1.95 (H) 0.61 - 1.24 mg/dL   Calcium 9.2 8.9 - 10.3  mg/dL   Total Protein 6.7 6.5 - 8.1 g/dL   Albumin 4.4 3.5 - 5.0 g/dL   AST 19 15 - 41 U/L   ALT 28 0 - 44 U/L   Alkaline Phosphatase 61 38 - 126 U/L   Total Bilirubin 0.6 0.3 - 1.2 mg/dL   GFR calc non Af Amer >60 >60 mL/min   GFR calc Af Amer >60 >60 mL/min   Anion gap 9 5 - 15   No results found.  EKG None  Radiology No results found.  Procedures Procedures (including critical care time)  Medications Ordered in ED Medications  vancomycin (VANCOCIN) IVPB 1000 mg/200 mL premix (has no administration in time range)  piperacillin-tazobactam  (ZOSYN) IVPB 3.375 g (has no administration in time range)  fentaNYL (SUBLIMAZE) injection 50 mcg (has no administration in time range)  0.9 %  sodium chloride infusion (has no administration in time range)  iohexol (OMNIPAQUE) 300 MG/ML solution 100 mL (100 mLs Intravenous Contrast Given 11/25/19 0533)    ED Course  I have reviewed the triage vital signs and the nursing notes.  Pertinent labs & imaging results that were available during my care of the patient were reviewed by me and considered in my medical decision making (see chart for details).   Given the swelling and pain out of proportion to exam in a poorly controlled diabetic I was concerned about necrotizing inf  618 case d/w Dr. Dwain Sarna, please consult urology as this is a primary urologic situation.  CCS would be happy to consult as well, but urology should be the primary surgical service.   626 Case d/w Tobi Bastos of urology. Transfer ED to ED for evaluation.     628 Case d/w Dr. Toniann Fail, he cannot admit and ED to ED transfer. States they must be consulted for admission when the patient arrives there  I attempted to explain that they have admitted an ED to ED transfer in the past  630 case d/w Dr. Wilkie Aye who is aware of the transfer.  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

## 2019-11-25 NOTE — Progress Notes (Signed)
Pharmacy Antibiotic Note  Cody [name] is a 49 y.o. male admitted on 11/25/2019 with scrotal cellulitis with possible gangrene.  Pharmacy has been consulted for Vancomycin dosing.  Plan:  Vanc 1g x 1 given in ED at 0725, give another 1500mg  to = 2500mg  for total loading dose  Then start vancomycin 1250mg  IV q12 thereafter - goal trough 15-20 mcg/mL  Zosyn x 1 given, would suggest continuing gram negative coverage given severity of infection - suggest Cefepime  Height: 6\' 2"  (188 cm) Weight: (!) 137 kg (302 lb) IBW/kg (Calculated) : 82.2  Temp (24hrs), Avg:99 F (37.2 C), Min:98.9 F (37.2 C), Max:99.1 F (37.3 C)  Recent Labs  Lab 11/25/19 0449  WBC 8.6  CREATININE 1.25*    Estimated Creatinine Clearance: 102.9 mL/min (A) (by C-G formula based on SCr of 1.25 mg/dL (H)).    No Known Allergies   Thank you for allowing pharmacy to be a part of this patient's care.  11/25/2019 9:51 AM

## 2019-11-25 NOTE — Discharge Summary (Signed)
AMA NOTE    Patient ID: Cody [name] MRN: [id number] DOB/AGE: 51-Oct-1972 51 y.o. Primary Care Physician:  [facility]  Admit date: 11/25/2019 Discharge date: 11/25/2019   PLEASE NOTE THAT PATIENT LEFT AGAINST MEDICAL ADVICE. Patient left AMA prior to risks/benefits of leaving/staying were explained.   Discharge Diagnoses:   Present on Admission: . Cellulitis   Brief H and P: For complete details please refer to admission H and P, but in brief this is a 49 year old male with past medical history of homelessness, diabetes, hypertension, OSA who presented to [facility] for 2 days of worsening left scrotal pain and was found to have a left scrotal cellulitis.  A CT scan showed left hemiscrotal edema with a 12 mm area of phlegmon with no soft tissue emphysema.  He was hemodynamically stable and afebrile and was transported to [facility] for urologic evaluation.  He was given a dose of vancomycin and Zosyn and urology was consulted.  Urology performed a bedside I&D of his testicular abscess and recommended the patient continue with 24 hours of IV antibiotics and transition to Bactrim x7 days and dressing change in a.m.  He had significant pain and was given Dilaudid on top of morphine and fentanyl from prior.  The patient continued to complain of pain but did not receive any further medications.  Apparently he became verbally aggressive with nursing and got up and left AMA prior to any hospitalist discussion regarding risks and benefits of leaving with the patient could be had.  Consults:  urology   Recommendations for Outpatient Follow-up: dressing changes, bactrim x 7 days    Allergies:  No Known Allergies  Discharge Medications: Please note that patient left AMA (against medical advice)   Hospital Course:  Active Problems:   Cellulitis   Day of Discharge BP (!) 167/113 (BP Location: Left Arm)   Pulse 64   Temp 98.9 F (37.2 C) (Oral)   Resp 16   Ht 6\' 2"  (1.88 m)   Wt (!) 137 kg    SpO2 100%   BMI 38.77 kg/m   Physical Exam: Physical Exam Vitals and nursing note reviewed.  Constitutional:      Appearance: Normal appearance.  HENT:     Head: Normocephalic and atraumatic.  Eyes:     Conjunctiva/sclera: Conjunctivae normal.  Cardiovascular:     Rate and Rhythm: Normal rate and regular rhythm.  Pulmonary:     Effort: Pulmonary effort is normal.     Breath sounds: Normal breath sounds.  Abdominal:     General: Abdomen is flat.     Palpations: Abdomen is soft.  Genitourinary:    Comments: Testicle post I&D with post surgical dressing in place Musculoskeletal:        General: No swelling or tenderness.  Skin:    Coloration: Skin is not jaundiced or pale.  Neurological:     Mental Status: He is alert. Mental status is at baseline.  Psychiatric:        Mood and Affect: Mood normal.        Behavior: Behavior normal.       The results of significant diagnostics from this hospitalization (including imaging, microbiology, ancillary and laboratory) are listed below for reference.    LAB RESULTS: Basic Metabolic Panel: Recent Labs  Lab 11/25/19 0449  NA 136  K 4.2  CL 103  CO2 24  GLUCOSE 268*  BUN 19  CREATININE 1.25*  CALCIUM 9.2   Liver Function Tests: Recent Labs  Lab  11/25/19 0449  AST 19  ALT 28  ALKPHOS 61  BILITOT 0.6  PROT 6.7  ALBUMIN 4.4   No results for input(s): LIPASE, AMYLASE in the last 168 hours. No results for input(s): AMMONIA in the last 168 hours. CBC: Recent Labs  Lab 11/25/19 0449  WBC 8.6  NEUTROABS 6.8  HGB 14.1  HCT 41.4  MCV 93.0  PLT 79*   Cardiac Enzymes: No results for input(s): CKTOTAL, CKMB, CKMBINDEX, TROPONINI in the last 168 hours. BNP: Invalid input(s): POCBNP CBG: No results for input(s): GLUCAP in the last 168 hours.  Significant Diagnostic Studies:  CT ABDOMEN PELVIS W CONTRAST  Result Date: 11/25/2019 CLINICAL DATA:  Abdominal pain, acute, nonlocalized. EXAM: CT ABDOMEN AND PELVIS  WITH CONTRAST TECHNIQUE: Multidetector CT imaging of the abdomen and pelvis was performed using the standard protocol following bolus administration of intravenous contrast. CONTRAST:  OMNIPAQUE IOHEXOL 300 MG/ML  SOLN COMPARISON:  None. FINDINGS: Lower chest:  No contributory findings. Hepatobiliary: Hepatic steatosis.No evidence of biliary obstruction or stone. Pancreas: Unremarkable. Spleen: Unremarkable. Adrenals/Urinary Tract: Negative adrenals. No hydronephrosis or stone. Unremarkable bladder. Stomach/Bowel:  No obstruction. No appendicitis. Vascular/Lymphatic: No acute vascular abnormality. Mild adenitis in the bilateral groin. Reproductive:Scrotal thickening and edema is present. A small left hydrocele is present. There is a 12 mm area of relative low-density in the left deep scrotum without well-defined, encapsulated nature to suggested is drainable. Other: No ascites or pneumoperitoneum. Musculoskeletal: Severe lumbar spine degeneration with vacuum phenomenon, disc collapse, and endplate sclerosis. Mild levoscoliosis. IMPRESSION: 1. Scrotal cellulitis with 12 mm area of phlegmon on the left. No soft tissue emphysema. 2. Small left hydrocele. 3. No acute intra-abdominal finding. 4. Hepatic steatosis. Electronically Signed   By: Marnee Spring M.D.   On: 11/25/2019 06:21    2D ECHO:   Disposition and Follow-up:    DISPOSITION: Patient left AMA.   DISCHARGE FOLLOW-UP  Signed:   Jae Dire D.O. [facility] 11/25/2019, 5:11 PM

## 2019-11-25 NOTE — ED Notes (Signed)
Secretary talked with legal guardian to notify him of patient departure, he was agreeable.

## 2019-11-25 NOTE — Consult Note (Signed)
I have been asked to see the patient by Dr. Cy Blamer, for evaluation and management of scrotal abscess.  History of present illness: 49 year old African-American homeless man who presented to the med [facility] with 2 days of worsening left scrotal pain.  In the emergency department he was found to have a left scrotal cellulitis.  A CT scan was performed demonstrating significant left hemiscrotal edema with no subcutaneous emphysema.  The patient was hemodynamically stable and afebrile.  He is a poorly controlled diabetic.  He was transferred over to the [facility] emergency department for urologic evaluation.    Review of systems: A 12 point comprehensive review of systems was obtained and is negative unless otherwise stated in the history of present illness.  Patient Active Problem List   Diagnosis Date Noted  . Cellulitis 11/25/2019  . Cellulitis of right lower leg 08/28/2019  . Uncontrolled type 2 diabetes mellitus with hyperglycemia (HCC) 08/28/2019  . Cellulitis of left foot 08/28/2019  . New onset type 2 diabetes mellitus (HCC) 12/06/2015  . Essential hypertension 12/05/2015  . GERD (gastroesophageal reflux disease) 12/05/2015  . Cellulitis of leg, right 12/04/2015  . AKI (acute kidney injury) (HCC) 12/04/2015  . Thrombocytopenia (HCC) 12/04/2015    No current facility-administered medications on file prior to encounter.   Current Outpatient Medications on File Prior to Encounter  Medication Sig Dispense Refill  . amLODipine (NORVASC) 10 MG tablet Take 10 mg by mouth daily.     Marland Kitchen aspirin EC 81 MG tablet Take 1 tablet (81 mg total) by mouth 2 (two) times daily. 84 tablet 0  . atorvastatin (LIPITOR) 40 MG tablet Take 40 mg by mouth daily.     . bacitracin ointment Apply 1 application topically 2 (two) times daily. 14 g 0  . chlorthalidone (HYGROTON) 25 MG tablet Take 25 mg by mouth daily.    . Cholecalciferol (VITAMIN D3) 125 MCG (5000 UT) CAPS Take 5,000 Units by  mouth daily.     Marland Kitchen docusate sodium (COLACE) 100 MG capsule Take 1 capsule (100 mg total) by mouth 2 (two) times daily. While taking narcotic pain medicine. 30 capsule 0  . DULoxetine (CYMBALTA) 30 MG capsule Take 30 mg by mouth daily.    Marland Kitchen glipiZIDE (GLUCOTROL XL) 5 MG 24 hr tablet Take 20 mg by mouth daily.    Marland Kitchen GLIPIZIDE XL 10 MG 24 hr tablet Take 10 mg by mouth daily.    Marland Kitchen lisinopril (ZESTRIL) 40 MG tablet Take 40 mg by mouth daily.    . metFORMIN (GLUCOPHAGE) 1000 MG tablet Take 1,000 mg by mouth 2 (two) times daily with a meal.    . naproxen (NAPROSYN) 500 MG tablet Take 500 mg by mouth 2 (two) times daily with a meal.     . ondansetron (ZOFRAN ODT) 4 MG disintegrating tablet Take 1 tablet (4 mg total) by mouth every 8 (eight) hours as needed for nausea or vomiting. (Patient not taking: Reported on 08/28/2019) 6 tablet 0  . oxyCODONE (OXY IR/ROXICODONE) 5 MG immediate release tablet Take 1 tablet (5 mg total) by mouth every 6 (six) hours as needed for moderate pain or severe pain. 15 tablet 0  . pantoprazole (PROTONIX) 40 MG tablet Take 40 mg by mouth daily.    . pioglitazone (ACTOS) 15 MG tablet Take 15 mg by mouth daily.    . polyethylene glycol (MIRALAX / GLYCOLAX) 17 g packet Take 17 g by mouth daily as needed for moderate constipation or severe  constipation. 14 each 0  . senna (SENOKOT) 8.6 MG TABS tablet Take 2 tablets (17.2 mg total) by mouth 2 (two) times daily. 30 tablet 0  . senna-docusate (SENOKOT-S) 8.6-50 MG tablet Take 2 tablets by mouth at bedtime as needed for mild constipation or moderate constipation. 60 tablet 0    Past Medical History:  Diagnosis Date  . Acid reflux   . Diabetes mellitus without complication (HCC)   . Hypertension   . Mixed hyperlipidemia   . Sleep apnea     Past Surgical History:  Procedure Laterality Date  . FOOT ARTHRODESIS Left 09/25/2018   Procedure: Left subtalar joint arthrodesis and peroneal tendon repair;  Surgeon: Toni Arthurs, MD;   Location: [facility];  Service: Orthopedics;  Laterality: Left;  . KNEE SURGERY      Social History   Tobacco Use  . Smoking status: Former Games developer  . Smokeless tobacco: Never Used  Vaping Use  . Vaping Use: Never used  Substance Use Topics  . Alcohol use: No  . Drug use: No    Family History  Problem Relation Age of Onset  . Hypertension Other     PE: Vitals:   11/25/19 0010 11/25/19 0012 11/25/19 0456 11/25/19 0730  BP:  (!) 164/103 (!) 171/100 (!) 167/113  Pulse:  89 62 64  Resp:  18 18 16   Temp:  99.1 F (37.3 C) 98.9 F (37.2 C)   TempSrc:   Oral   SpO2:  97% 100% 100%  Weight: (!) 137 kg     Height: 6\' 2"  (1.88 m)      Patient appears to be in no acute distress  patient is alert and oriented x3 Atraumatic normocephalic head No cervical or supraclavicular lymphadenopathy appreciated No increased work of breathing, no audible wheezes/rhonchi Regular sinus rhythm/rate Abdomen is soft, nontender, nondistended, no CVA or suprapubic tenderness Left hemiscrotal swelling and fluctuance with a small area of drainage, no right hemiscrotal swelling or tenderness, no suprapubic tenderness He has some left inguinal lymphadenopathy Lower extremities are symmetric without appreciable edema Grossly neurologically intact No identifiable skin lesions  Recent Labs    11/25/19 0449  WBC 8.6  HGB 14.1  HCT 41.4   Recent Labs    11/25/19 0449  NA 136  K 4.2  CL 103  CO2 24  GLUCOSE 268*  BUN 19  CREATININE 1.25*  CALCIUM 9.2   No results for input(s): LABPT, INR in the last 72 hours. No results for input(s): LABURIN in the last 72 hours. Results for orders placed or performed during the hospital encounter of 11/25/19  SARS Coronavirus 2 by RT PCR (hospital order, performed in [facility] hospital lab) Nasopharyngeal Nasopharyngeal Swab     Status: None   Collection Time: 11/25/19  4:50 AM   Specimen: Nasopharyngeal Swab  Result Value Ref Range  Status   SARS Coronavirus 2 NEGATIVE NEGATIVE Final    Comment: (NOTE) SARS-CoV-2 target nucleic acids are NOT DETECTED.  The SARS-CoV-2 RNA is generally detectable in upper and lower respiratory specimens during the acute phase of infection. The lowest concentration of SARS-CoV-2 viral copies this assay can detect is 250 copies / mL. A negative result does not preclude SARS-CoV-2 infection and should not be used as the sole basis for treatment or other patient management decisions.  A negative result may occur with improper specimen collection / handling, submission of specimen other than nasopharyngeal swab, presence of viral mutation(s) within the areas targeted by this  assay, and inadequate number of viral copies (<250 copies / mL). A negative result must be combined with clinical observations, patient history, and epidemiological information.  Fact Sheet for Patients:   BoilerBrush.com.cy  Fact Sheet for Healthcare Providers: https://pope.com/  This test is not yet approved or  cleared by the [place] FDA and has been authorized for detection and/or diagnosis of SARS-CoV-2 by FDA under an Emergency Use Authorization (EUA).  This EUA will remain in effect (meaning this test can be used) for the duration of the COVID-19 declaration under Section 564(b)(1) of the Act, 21 U.S.C. section 360bbb-3(b)(1), unless the authorization is terminated or revoked sooner.  Performed at [facility], [address] [place], [place] [address]   Blood culture (routine x 2)     Status: None (Preliminary result)   Collection Time: 11/25/19  6:17 AM   Specimen: BLOOD  Result Value Ref Range Status   Specimen Description   Final    BLOOD RIGHT ANTECUBITAL Performed at [facility], [address] [place], [place] [address]    Special Requests   Final    BOTTLES DRAWN AEROBIC AND ANAEROBIC Blood Culture adequate  volume Performed at [facility], [address] [place], [place] [address]    Culture PENDING  Incomplete   Report Status PENDING  Incomplete  Blood culture (routine x 2)     Status: None (Preliminary result)   Collection Time: 11/25/19  6:19 AM   Specimen: BLOOD  Result Value Ref Range Status   Specimen Description   Final    BLOOD LEFT ANTECUBITAL Performed at [facility], [address] [place], [place] [address]    Special Requests   Final    BOTTLES DRAWN AEROBIC AND ANAEROBIC Blood Culture adequate volume Performed at [facility], [address] [place], [place] [address]    Culture PENDING  Incomplete   Report Status PENDING  Incomplete    Imaging: I independently reviewed the patient's CT scan which demonstrates fluctuance and induration/edema but no subcutaneous air in the left hemiscrotum  Procedure: I obtained verbal consent from the patient.  I then prepped the left hemiscrotum with Betadine scrub and placed a sterile drape over the area.  I then injected 10 cc of 1% lidocaine into and around the region of fluctuance.  I then performed oh 2 cm cruciate incision and expressed bloody purulent drainage.  I then packed the opening with half-inch packing.  We then put 4 x 4's over the area and ABD followed by mesh underpants.  Imp: The patient has a left hemiscrotal cellulitis and abscess which I I indeed and packed.  Recommendations: I recommended that the patient be admitted for observation and IV antibiotics x24 hours.  We will then transition him to Bactrim for 7 days.  We will change his dressing tomorrow.`   Thank you for involving me in this patient's care,  I will continue to follow along.  Crist Fat

## 2019-11-25 NOTE — ED Notes (Signed)
Patient stated he is leaving AMA, attempted to call legal guardian number on file to notify, no response. He stated he 'can get better pain medication at the house.' MD aware. Prior to leaving AMA Md explained the risks of leaving, RN also explained the risk to his scrotum, further infection and potential surgical need. Patient left AMA. Ambulatory w/o assistance. Alert and oriented at departure.

## 2019-11-25 NOTE — ED Notes (Signed)
Pt requesting more pain medication, MD aware, no orders at this time.

## 2019-11-26 ENCOUNTER — Other Ambulatory Visit: Payer: Self-pay

## 2019-11-26 ENCOUNTER — Encounter (HOSPITAL_COMMUNITY): Payer: Self-pay

## 2019-11-26 ENCOUNTER — Emergency Department (HOSPITAL_COMMUNITY)
Admission: EM | Admit: 2019-11-26 | Discharge: 2019-11-26 | Disposition: A | Discharge status: Home / Self Care | Payer: PRIVATE HEALTH INSURANCE | Attending: Emergency Medicine | Admitting: Emergency Medicine

## 2019-11-26 DIAGNOSIS — N492 Inflammatory disorders of scrotum: Secondary | ICD-10-CM

## 2019-11-26 DIAGNOSIS — N454 Abscess of epididymis or testis: Secondary | ICD-10-CM | POA: Insufficient documentation

## 2019-11-26 DIAGNOSIS — I10 Essential (primary) hypertension: Secondary | ICD-10-CM | POA: Insufficient documentation

## 2019-11-26 DIAGNOSIS — E119 Type 2 diabetes mellitus without complications: Secondary | ICD-10-CM | POA: Insufficient documentation

## 2019-11-26 DIAGNOSIS — Z4800 Encounter for change or removal of nonsurgical wound dressing: Secondary | ICD-10-CM | POA: Diagnosis not present

## 2019-11-26 DIAGNOSIS — Z87891 Personal history of nicotine dependence: Secondary | ICD-10-CM | POA: Insufficient documentation

## 2019-11-26 DIAGNOSIS — Z7982 Long term (current) use of aspirin: Secondary | ICD-10-CM | POA: Diagnosis not present

## 2019-11-26 DIAGNOSIS — Z7984 Long term (current) use of oral hypoglycemic drugs: Secondary | ICD-10-CM | POA: Insufficient documentation

## 2019-11-26 DIAGNOSIS — Z79899 Other long term (current) drug therapy: Secondary | ICD-10-CM | POA: Insufficient documentation

## 2019-11-26 DIAGNOSIS — Z5189 Encounter for other specified aftercare: Secondary | ICD-10-CM

## 2019-11-26 MED ORDER — SULFAMETHOXAZOLE-TRIMETHOPRIM 800-160 MG PO TABS: 1.0000 | ORAL_TABLET | Freq: Two times a day (BID) | ORAL | 0 refills | Status: AC

## 2019-11-26 MED ORDER — SODIUM CHLORIDE 0.9 % IV SOLN
2.0000 g | Freq: Once | INTRAVENOUS | Status: AC
  Administered 2019-11-26: 2 g via INTRAVENOUS
  Filled 2019-11-26: qty 20

## 2019-11-26 NOTE — Discharge Instructions (Addendum)
Removing scrotal packing in 24 hours.   Use guaze to keep wound clean.   Showering and good hygiene will be important to having this wound heal.   It is also very important to control your diabetes.   Complete the antibiotics as prescribed.

## 2019-11-26 NOTE — Progress Notes (Signed)
PHARMACY - PHYSICIAN COMMUNICATION CRITICAL VALUE ALERT - BLOOD CULTURE IDENTIFICATION (BCID)  Cody [name] is an 49 y.o. male who presented to [facility] on 11/26/2019 with a chief complaint of scrotal abscess  Assessment:  GPR in 1/4, lab does not perform BCID on GPR, likely contaminant  Name of physician (or Provider) Contacted: Dr. Rosalia Hammers  Current antibiotics: CTX >> Bactrim  Changes to prescribed antibiotics recommended:  none  No results found for this or any previous visit.  [name] 11/26/2019  4:49 PM

## 2019-11-26 NOTE — ED Notes (Signed)
Patient yelling and cursing about the wait time. "I been here 6 fucking hours, this some bullshit." Apologized for the wait and told the patient we are moving as fast as we can to get patients seen by the doctor.

## 2019-11-26 NOTE — ED Provider Notes (Signed)
Spoke with Urology regarding patient. He remains in the waiting room here after leaving AMA last night. Recommends additional Rocephin dose, replace scrotal packing, and d/c home with Rx for Bactrim.   Alona Bene, MD   Maia Plan, MD 11/26/19 [phone]

## 2019-11-26 NOTE — ED Provider Notes (Signed)
[facility]-EMERGENCY DEPT Provider Note   CSN: [id number] Arrival date & time: 11/26/19  6712     History Chief Complaint  Patient presents with  . Follow-up    Cody [name] is a 49 y.o. male.  HPI    49 year old male history of type 1 diabetes, hypertension, presents today after leaving AMA yesterday for admission.  Patient was seen and diagnosed with scrotal abscess.  He had I&D done here by Dr. Marlou Porch.  Initially evaluated with CT that showed scrotal cellulitis with phlegmon.  Patient was admitted to hospitalist service.  He then left AGAINST MEDICAL ADVICE.  He returned and waited waiting room today for 10 hours.  He states that this area is bleeding.  He has not had worsening fevers, chills, nausea, or vomiting.  I reviewed the record and Dr. Marlou Porch advised that when patient returns he should be given dose of Rocephin and discharged on Bactrim. Past Medical History:  Diagnosis Date  . Acid reflux   . Diabetes mellitus without complication (HCC)   . Hypertension   . Mixed hyperlipidemia   . Sleep apnea     Patient Active Problem List   Diagnosis Date Noted  . Cellulitis 11/25/2019  . Cellulitis of right lower leg 08/28/2019  . Uncontrolled type 2 diabetes mellitus with hyperglycemia (HCC) 08/28/2019  . Cellulitis of left foot 08/28/2019  . New onset type 2 diabetes mellitus (HCC) 12/06/2015  . Essential hypertension 12/05/2015  . GERD (gastroesophageal reflux disease) 12/05/2015  . Cellulitis of leg, right 12/04/2015  . AKI (acute kidney injury) (HCC) 12/04/2015  . Thrombocytopenia (HCC) 12/04/2015    Past Surgical History:  Procedure Laterality Date  . FOOT ARTHRODESIS Left 09/25/2018   Procedure: Left subtalar joint arthrodesis and peroneal tendon repair;  Surgeon: Toni Arthurs, MD;  Location:  S[facility];  Service: Orthopedics;  Laterality: Left;  . KNEE SURGERY         Family History  Problem Relation Age of Onset  .  Hypertension Other     Social History   Tobacco Use  . Smoking status: Former Games developer  . Smokeless tobacco: Never Used  Vaping Use  . Vaping Use: Never used  Substance Use Topics  . Alcohol use: No  . Drug use: No    Home Medications Prior to Admission medications   Medication Sig Start Date End Date Taking? Authorizing Provider  amLODipine (NORVASC) 10 MG tablet Take 10 mg by mouth daily.  04/04/18   [provider]  aspirin EC 81 MG tablet Take 1 tablet (81 mg total) by mouth 2 (two) times daily. 09/25/18   Jacinta Shoe, PA-C  atorvastatin (LIPITOR) 40 MG tablet Take 40 mg by mouth daily.  05/30/18   [provider]  bacitracin ointment Apply 1 application topically 2 (two) times daily. 06/24/19   Derwood Kaplan, MD  chlorthalidone (HYGROTON) 25 MG tablet Take 25 mg by mouth daily.    [provider]  Cholecalciferol (VITAMIN D3) 125 MCG (5000 UT) CAPS Take 5,000 Units by mouth daily.  04/30/19   [provider]  docusate sodium (COLACE) 100 MG capsule Take 1 capsule (100 mg total) by mouth 2 (two) times daily. While taking narcotic pain medicine. 09/25/18   Jacinta Shoe, PA-C  DULoxetine (CYMBALTA) 30 MG capsule Take 30 mg by mouth daily. 04/27/19   [provider]  glipiZIDE (GLUCOTROL XL) 5 MG 24 hr tablet Take 20 mg by mouth daily. 11/13/19   [provider]  GLIPIZIDE XL 10 MG 24 hr tablet Take 10 mg by mouth daily. 06/18/19   [provider]  lisinopril (ZESTRIL) 40 MG tablet Take 40 mg by mouth daily.    [provider]  metFORMIN (GLUCOPHAGE) 1000 MG tablet Take 1,000 mg by mouth 2 (two) times daily with a meal.    [provider]  naproxen (NAPROSYN) 500 MG tablet Take 500 mg by mouth 2 (two) times daily with a meal.     [provider]  ondansetron (ZOFRAN ODT) 4 MG disintegrating tablet Take 1 tablet (4 mg total) by mouth every 8 (eight) hours as needed for nausea or vomiting. Patient  not taking: Reported on 08/28/2019 08/30/18   Albrizze, Yvonna Alanis E, PA-C  oxyCODONE (OXY IR/ROXICODONE) 5 MG immediate release tablet Take 1 tablet (5 mg total) by mouth every 6 (six) hours as needed for moderate pain or severe pain. 08/30/19   Amin, Loura Halt, MD  pantoprazole (PROTONIX) 40 MG tablet Take 40 mg by mouth daily. 05/19/19   [provider]  pioglitazone (ACTOS) 15 MG tablet Take 15 mg by mouth daily. 11/10/19   [provider]  polyethylene glycol (MIRALAX / GLYCOLAX) 17 g packet Take 17 g by mouth daily as needed for moderate constipation or severe constipation. 08/30/19   Amin, Loura Halt, MD  senna (SENOKOT) 8.6 MG TABS tablet Take 2 tablets (17.2 mg total) by mouth 2 (two) times daily. 09/25/18   Jacinta Shoe, PA-C  senna-docusate (SENOKOT-S) 8.6-50 MG tablet Take 2 tablets by mouth at bedtime as needed for mild constipation or moderate constipation. 08/30/19   Dimple Nanas, MD    Allergies    Patient has no known allergies.  Review of Systems   Review of Systems  All other systems reviewed and are negative.   Physical Exam Updated Vital Signs BP (!) 161/116   Pulse 66   Temp 98.3 F (36.8 C) (Oral)   Resp 18   SpO2 96%   Physical Exam Vitals and nursing note reviewed.  Constitutional:      General: He is not in acute distress.    Appearance: Normal appearance. He is not ill-appearing.  HENT:     Head: Normocephalic.     Nose: Nose normal.  Eyes:     Pupils: Pupils are equal, round, and reactive to light.  Cardiovascular:     Rate and Rhythm: Normal rate and regular rhythm.  Pulmonary:     Effort: Pulmonary effort is normal.  Abdominal:     Palpations: Abdomen is soft.  Genitourinary:    Penis: Normal.      Comments: Patient with packing in place left scrotum.  Packing removed.  There is no redness, or discharge noted.  No increased swelling or tenderness. Musculoskeletal:     Cervical back: Normal range of motion.  Skin:     General: Skin is warm and dry.     Capillary Refill: Capillary refill takes less than 2 seconds.  Neurological:     General: No focal deficit present.     Mental Status: He is alert.  Psychiatric:        Mood and Affect: Affect is angry and inappropriate.     ED Results / Procedures / Treatments   Labs (all labs ordered are listed, but only abnormal results are displayed) Labs Reviewed - No data to display  EKG None  Radiology CT ABDOMEN PELVIS W CONTRAST  Result Date: 11/25/2019 CLINICAL DATA:  Abdominal  pain, acute, nonlocalized. EXAM: CT ABDOMEN AND PELVIS WITH CONTRAST TECHNIQUE: Multidetector CT imaging of the abdomen and pelvis was performed using the standard protocol following bolus administration of intravenous contrast. CONTRAST:  OMNIPAQUE IOHEXOL 300 MG/ML  SOLN COMPARISON:  None. FINDINGS: Lower chest:  No contributory findings. Hepatobiliary: Hepatic steatosis.No evidence of biliary obstruction or stone. Pancreas: Unremarkable. Spleen: Unremarkable. Adrenals/Urinary Tract: Negative adrenals. No hydronephrosis or stone. Unremarkable bladder. Stomach/Bowel:  No obstruction. No appendicitis. Vascular/Lymphatic: No acute vascular abnormality. Mild adenitis in the bilateral groin. Reproductive:Scrotal thickening and edema is present. A small left hydrocele is present. There is a 12 mm area of relative low-density in the left deep scrotum without well-defined, encapsulated nature to suggested is drainable. Other: No ascites or pneumoperitoneum. Musculoskeletal: Severe lumbar spine degeneration with vacuum phenomenon, disc collapse, and endplate sclerosis. Mild levoscoliosis. IMPRESSION: 1. Scrotal cellulitis with 12 mm area of phlegmon on the left. No soft tissue emphysema. 2. Small left hydrocele. 3. No acute intra-abdominal finding. 4. Hepatic steatosis. Electronically Signed   By: Marnee Spring M.D.   On: 11/25/2019 06:21    Procedures Procedures (including critical care  time)  Medications Ordered in ED Medications  cefTRIAXone (ROCEPHIN) 2 g in sodium chloride 0.9 % 100 mL IVPB (has no administration in time range)    ED Course  I have reviewed the triage vital signs and the nursing notes.  Pertinent labs & imaging results that were available during my care of the patient were reviewed by me and considered in my medical decision making (see chart for details).    MDM Rules/Calculators/A&P                          Patient being given 2 g of Rocephin here in ED.  Review of positive blood culture that is likely contaminant.  Plan outpatient Bactrim and follow-up with Dr. Marlou Porch in office. Discussed with patient and he voices understanding of plan and return precautions Final Clinical Impression(s) / ED Diagnoses Final diagnoses:  Scrotal abscess  Wound check, abscess    Rx / DC Orders ED Discharge Orders    None       Margarita Grizzle, MD 11/26/19 0569

## 2019-11-26 NOTE — ED Notes (Addendum)
Pt refused vitals. Pt says he is "only here to get antibiotics and bandage changed." Pt says he "does not need his vitals checked."

## 2019-11-26 NOTE — ED Triage Notes (Signed)
Pt arrived via walk in, states he was seen yesterday for scrotal pain, had "a procedure done" they wanted to admit him but pt wanted to leave. Pt states he is here for follow up and "needs abx"

## 2019-11-26 NOTE — Consult Note (Signed)
Patient left AMA last night.  Needs his packing changed today, extra dose of IV abx,  and then discharged with 1 week of bactrim.  He can remove his packing tomorrow.   Will need wound care instructions - should be discharged home with some extra guaze to help keep wound clean.  He will need to f/u with Urology, call for an appointment.

## 2019-11-26 NOTE — ED Notes (Signed)
He refuses update to v.s.

## 2019-11-29 ENCOUNTER — Telehealth: Payer: Self-pay

## 2019-11-29 NOTE — Progress Notes (Cosign Needed Addendum)
ED Antimicrobial Stewardship Positive Culture Follow Up   Cody [name] is an 49 y.o. male who presented to [facility] on 11/26/2019 with a chief complaint of wound check for scrotal abscess  Chief Complaint  Patient presents with  . Follow-up    Recent Results (from the past 720 hour(s))  SARS Coronavirus 2 by RT PCR (hospital order, performed in [facility] hospital lab) Nasopharyngeal Nasopharyngeal Swab     Status: None   Collection Time: 11/25/19  4:50 AM   Specimen: Nasopharyngeal Swab  Result Value Ref Range Status   SARS Coronavirus 2 NEGATIVE NEGATIVE Final    Comment: (NOTE) SARS-CoV-2 target nucleic acids are NOT DETECTED.  The SARS-CoV-2 RNA is generally detectable in upper and lower respiratory specimens during the acute phase of infection. The lowest concentration of SARS-CoV-2 viral copies this assay can detect is 250 copies / mL. A negative result does not preclude SARS-CoV-2 infection and should not be used as the sole basis for treatment or other patient management decisions.  A negative result may occur with improper specimen collection / handling, submission of specimen other than nasopharyngeal swab, presence of viral mutation(s) within the areas targeted by this assay, and inadequate number of viral copies (<250 copies / mL). A negative result must be combined with clinical observations, patient history, and epidemiological information.  Fact Sheet for Patients:   BoilerBrush.com.cy  Fact Sheet for Healthcare Providers: https://pope.com/  This test is not yet approved or  cleared by the [place] FDA and has been authorized for detection and/or diagnosis of SARS-CoV-2 by FDA under an Emergency Use Authorization (EUA).  This EUA will remain in effect (meaning this test can be used) for the duration of the COVID-19 declaration under Section 564(b)(1) of the Act, 21 U.S.C. section 360bbb-3(b)(1), unless the  authorization is terminated or revoked sooner.  Performed at [facility], [address] [place], [place] [address]   Blood culture (routine x 2)     Status: Abnormal (Preliminary result)   Collection Time: 11/25/19  6:17 AM   Specimen: BLOOD  Result Value Ref Range Status   Specimen Description   Final    BLOOD RIGHT ANTECUBITAL Performed at [facility], [address] [place], [place] [address]    Special Requests   Final    BOTTLES DRAWN AEROBIC AND ANAEROBIC Blood Culture adequate volume Performed at [facility] [address] [place], [place] [address]    Culture  Setup Time   Final    GRAM POSITIVE RODS ANAEROBIC BOTTLE ONLY CRITICAL RESULT CALLED TO, READ BACK BY AND VERIFIED WITH: PHARMD S Kaj Vasil 11/26/19 AT 1539 FP/SK    Culture (A)  Final    DIPHTHEROIDS(CORYNEBACTERIUM SPECIES) Standardized susceptibility testing for this organism is not available. Performed at [facility], [address] [place], [place] [address]    Report Status PENDING  Incomplete  Blood culture (routine x 2)     Status: None (Preliminary result)   Collection Time: 11/25/19  6:19 AM   Specimen: BLOOD  Result Value Ref Range Status   Specimen Description   Final    BLOOD LEFT ANTECUBITAL Performed at [facility], [address], [place], [place] [address]    Special Requests   Final    BOTTLES DRAWN AEROBIC AND ANAEROBIC Blood Culture adequate volume Performed at [facility], [address] [place], [place] [address]    Culture  Final    NO GROWTH 4 DAYS Performed at [facility], [address] [place], [place] [address]    Report Status PENDING  Incomplete    Patient was seen in ED 9/1 for a dose of ceftriaxone and wound check for scrotal abscess. Patient is followed by [facility]. Plan was to continue Septra outpatient with urology. Blood cultures in ED resulted with GPC in 1/4 (anaerobic bottle) bottles.  Thought to be a contaminant. GPC speciated out to diptheroids which is likely contaminant. Patient was afebrile with normal WBC in ED without signs of systemic infection. Dr. Marlou Porch with [facility] notified.   New antibiotic prescription: none  ED Provider: Sharlyn Bologna D 11/29/2019, 7:25 AM Clinical Pharmacist [phone]

## 2019-11-29 NOTE — Telephone Encounter (Signed)
Post ED Visit - Positive Culture Follow-up  Culture report reviewed by antimicrobial stewardship pharmacist: [facility] []  , Pharm.D. []  Enzo Bi, Pharm.D., BCPS AQ-ID []  , Pharm.D., BCPS []  Celedonio Miyamoto, Pharm.D., BCPS []  [place], Garvin Fila.D., BCPS, AAHIVP []  , Pharm.D., BCPS, AAHIVP []  Georgina Pillion, PharmD, BCPS []  , PharmD, BCPS []  [place], PharmD, BCPS []  [address], PharmD []  , PharmD, BCPS []  Estella Husk, PharmD  Pharmacy Team []  Lysle Pearl, PharmD []  , PharmD []  Phillips Climes, PharmD []  , Rph []  Agapito Games) , PharmD []  Verlan Friends, PharmD []  , PharmD []  Mervyn Gay, PharmD []  , PharmD []  Vinnie Level, PharmD []  Wonda Olds, PharmD []  , PharmD []  Len Childs, PharmD Pharm D  Positive urine culture and no further patient follow-up is required at this time.  Greer Pickerel 11/29/2019, 8:03 AM

## 2019-11-30 LAB — CULTURE, BLOOD (ROUTINE X 2)
Culture: NO GROWTH
Special Requests: ADEQUATE

## 2019-12-01 LAB — CULTURE, BLOOD (ROUTINE X 2): Special Requests: ADEQUATE

## 2019-12-02 ENCOUNTER — Telehealth: Payer: Self-pay | Admitting: Emergency Medicine

## 2019-12-02 NOTE — Telephone Encounter (Signed)
Post ED Visit - Positive Culture Follow-up  Culture report reviewed by antimicrobial stewardship pharmacist: [facility] []  , Pharm.D. []  Enzo Bi, Pharm.D., BCPS AQ-ID []  , Pharm.D., BCPS []  Celedonio Miyamoto, Pharm.D., BCPS []  [place], Garvin Fila.D., BCPS, AAHIVP []  , Pharm.D., BCPS, AAHIVP []  Georgina Pillion, PharmD, BCPS []  , PharmD, BCPS []  [place], PharmD, BCPS []  [address], PharmD []  , PharmD, BCPS []  Estella Husk, PharmD  Pharmacy Team []  Lysle Pearl, PharmD []  , PharmD []  Phillips Climes, PharmD []  , Rph []  Agapito Games) , PharmD []  Verlan Friends, PharmD []  , PharmD []  Mervyn Gay, PharmD []  , PharmD [x]  Vinnie Level, PharmD []  W[facility] Old[employer], PharmD []  , PharmD []  Len Childs, PharmD   Positive blood culture [facility] has previously been notified per the notes, no further patient follow-up is required at this time.  12/02/2019, 11:13 AM

## 2020-09-14 ENCOUNTER — Encounter (HOSPITAL_BASED_OUTPATIENT_CLINIC_OR_DEPARTMENT_OTHER): Payer: Self-pay

## 2020-09-14 ENCOUNTER — Emergency Department (HOSPITAL_BASED_OUTPATIENT_CLINIC_OR_DEPARTMENT_OTHER)
Admission: EM | Admit: 2020-09-14 | Discharge: 2020-09-14 | Disposition: A | Discharge status: Home / Self Care | Payer: No Typology Code available for payment source | Attending: Emergency Medicine | Admitting: Emergency Medicine

## 2020-09-14 ENCOUNTER — Other Ambulatory Visit: Payer: Self-pay

## 2020-09-14 DIAGNOSIS — E119 Type 2 diabetes mellitus without complications: Secondary | ICD-10-CM | POA: Insufficient documentation

## 2020-09-14 DIAGNOSIS — Z7982 Long term (current) use of aspirin: Secondary | ICD-10-CM | POA: Diagnosis not present

## 2020-09-14 DIAGNOSIS — Z87891 Personal history of nicotine dependence: Secondary | ICD-10-CM | POA: Insufficient documentation

## 2020-09-14 DIAGNOSIS — Z79899 Other long term (current) drug therapy: Secondary | ICD-10-CM | POA: Insufficient documentation

## 2020-09-14 DIAGNOSIS — S39012A Strain of muscle, fascia and tendon of lower back, initial encounter: Secondary | ICD-10-CM

## 2020-09-14 DIAGNOSIS — Y92410 Unspecified street and highway as the place of occurrence of the external cause: Secondary | ICD-10-CM | POA: Insufficient documentation

## 2020-09-14 DIAGNOSIS — Z7984 Long term (current) use of oral hypoglycemic drugs: Secondary | ICD-10-CM | POA: Insufficient documentation

## 2020-09-14 DIAGNOSIS — S34109A Unspecified injury to unspecified level of lumbar spinal cord, initial encounter: Secondary | ICD-10-CM | POA: Diagnosis present

## 2020-09-14 DIAGNOSIS — M7918 Myalgia, other site: Secondary | ICD-10-CM

## 2020-09-14 DIAGNOSIS — I10 Essential (primary) hypertension: Secondary | ICD-10-CM | POA: Insufficient documentation

## 2020-09-14 MED ORDER — KETOROLAC TROMETHAMINE 60 MG/2ML IM SOLN
60.0000 mg | Freq: Once | INTRAMUSCULAR | Status: AC
  Administered 2020-09-14: 60 mg via INTRAMUSCULAR
  Filled 2020-09-14: qty 2

## 2020-09-14 MED ORDER — CYCLOBENZAPRINE HCL 10 MG PO TABS: 10.0000 mg | ORAL_TABLET | Freq: Two times a day (BID) | ORAL | 0 refills | Status: DC | PRN
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
## 2020-09-14 NOTE — Discharge Instructions (Addendum)
Overall I think you have a back spasm.  Take 400 mg ibuprofen every 8 hours as needed for pain.  Recommend taking 1000 mg of Tylenol every 6 hours as needed for pain.  Take muscle relaxant as prescribed.

## 2020-12-11 IMAGING — DX DG FOOT COMPLETE 3+V*L*
3 series · 3 of 3 positions shown · non-contrast
Comparison: 06/23/2019

CLINICAL DATA: Left foot pain

EXAM:
LEFT FOOT - COMPLETE 3+ VIEW

[foot obl]
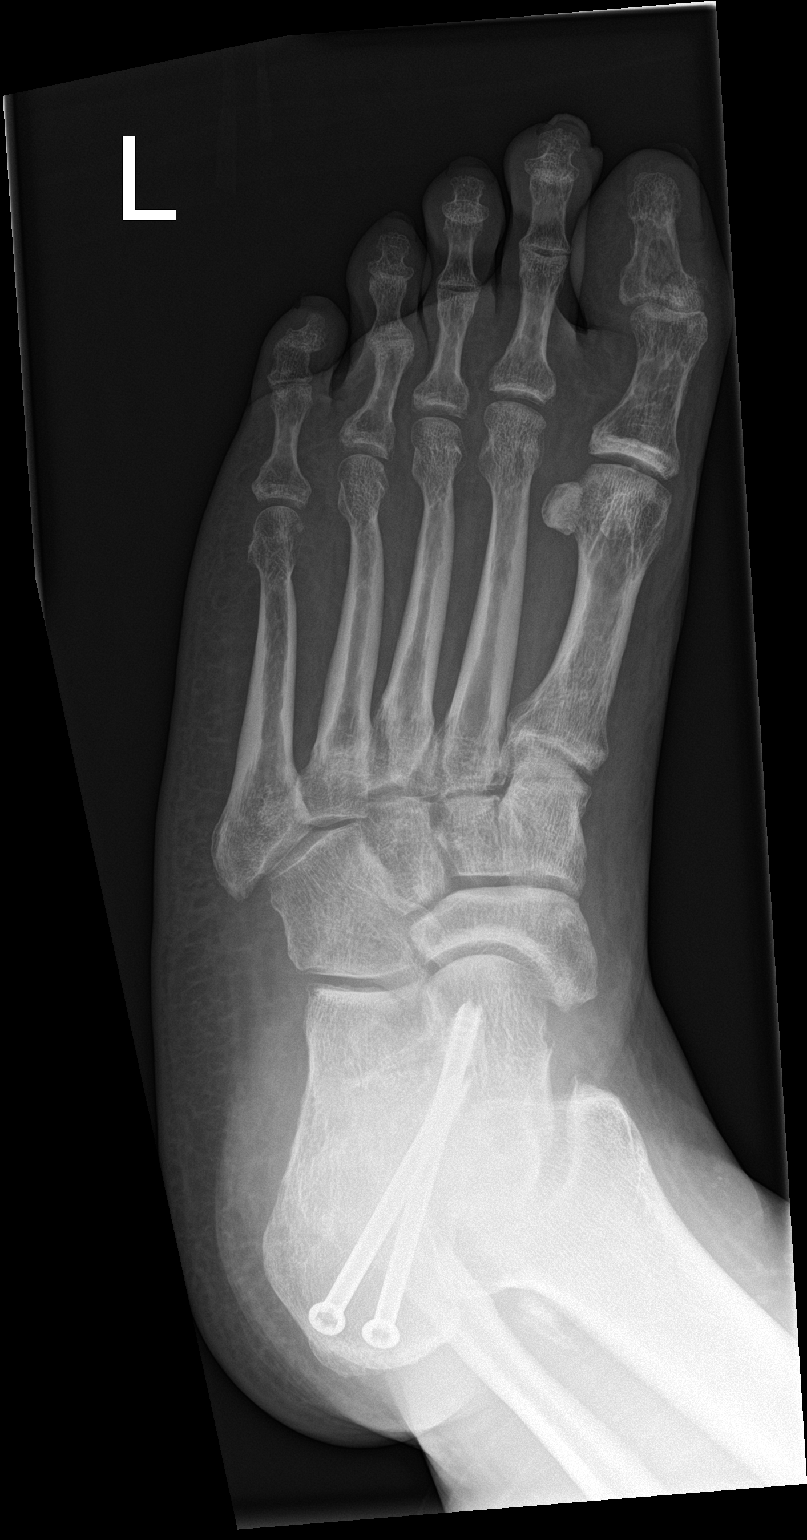

[foot ap]
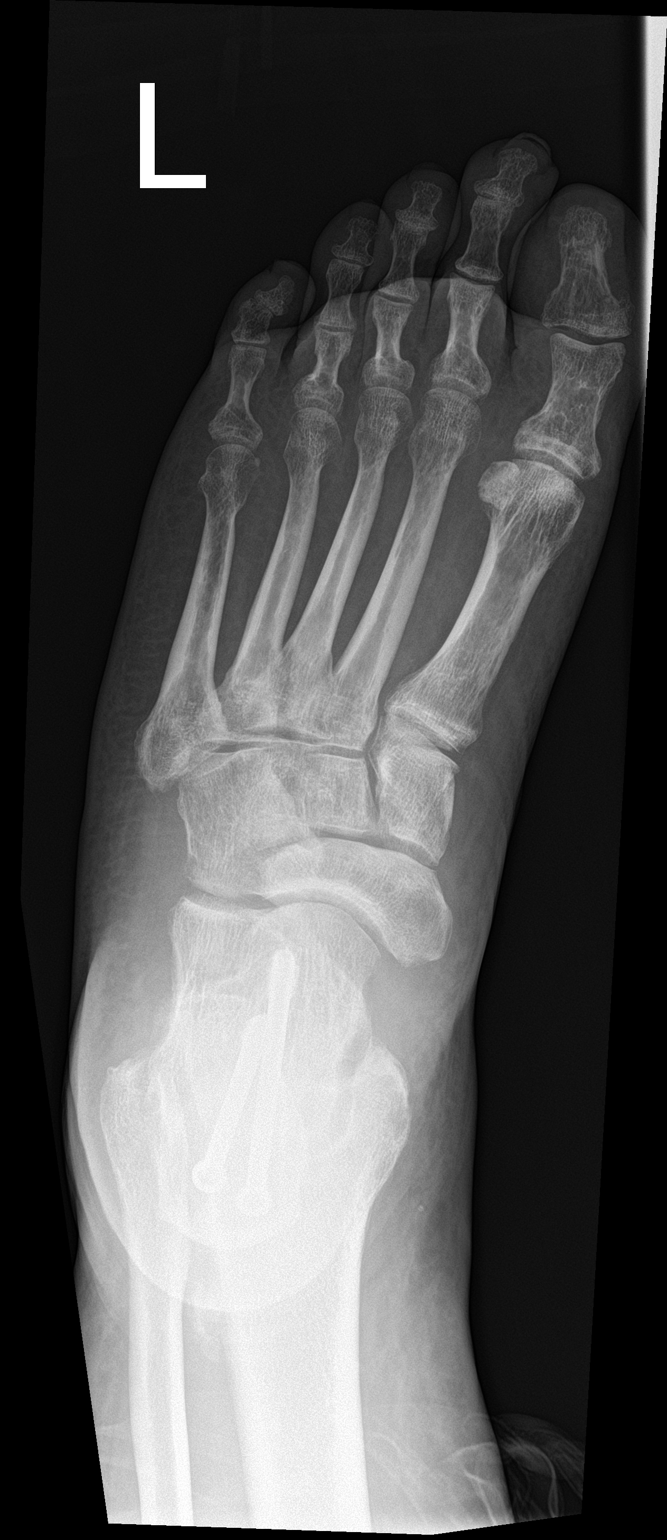

[foot lat]
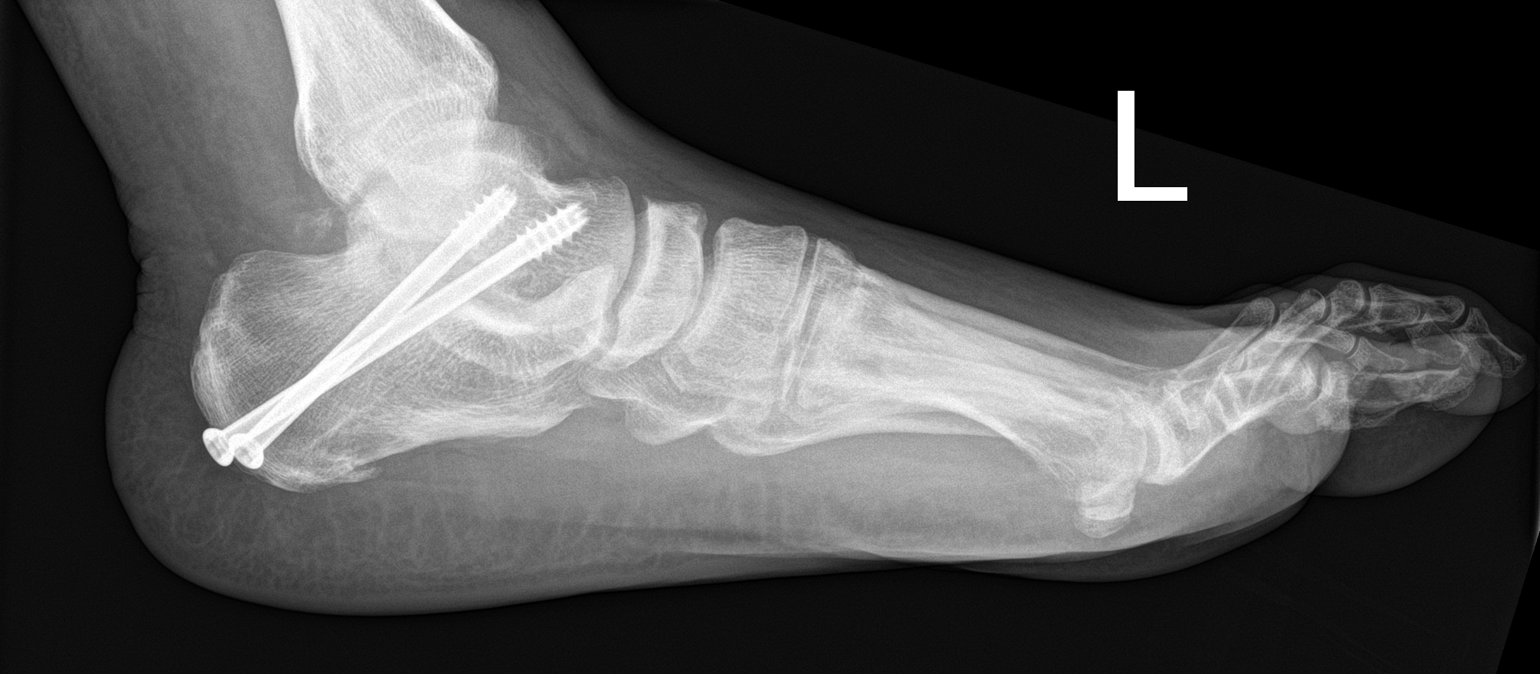

[3 of 3 positions shown; findings below may reference images not displayed]

FINDINGS: Frontal, oblique, and lateral views of the left foot are obtained.
There is a minimally displaced intra-articular fracture medial
aspect base of the first distal phalanx. No other acute bony
abnormalities. Postsurgical changes are seen from talocalcaneal
fusion. Soft tissues are unremarkable.
IMPRESSION: 1. Acute intra-articular fracture at the base of the first distal
phalanx.

## 2021-01-18 ENCOUNTER — Other Ambulatory Visit (HOSPITAL_COMMUNITY): Payer: Self-pay | Admitting: Orthopedic Surgery

## 2021-02-24 NOTE — Progress Notes (Signed)
Surgical Instructions    Your procedure is scheduled on Thursday, December 8th, 2022.   Report to [facility] Main Entrance "A" at 05:30 A.M., then check in with the Admitting office.  Call this number if you have problems the morning of surgery:  [phone]   If you have any questions prior to your surgery date call [phone]: Open Monday-Friday 8am-4pm    Remember:  Do not eat after midnight the night before your surgery  You may drink clear liquids until 04:30 the morning of your surgery.   Clear liquids allowed are: Water, Non-Citrus Juices (without pulp), Carbonated Beverages, Clear Tea, Black Coffee ONLY (NO MILK, CREAM OR POWDERED CREAMER of any kind), and Gatorade  Patient Instructions  The day of surgery (if you have diabetes): Drink ONE (1) 12 oz G2 given to you in your pre admission testing appointment by 04:30 the morning of surgery. Drink in one sitting. Do not sip.  This drink was given to you during your hospital  pre-op appointment visit.  Nothing else to drink after completing the  12 oz bottle of G2.         If you have questions, please contact your surgeon's office.     Take these medicines the morning of surgery with A SIP OF WATER:  amLODipine (NORVASC) atorvastatin (LIPITOR) DULoxetine (CYMBALTA) pantoprazole (PROTONIX)   If needed:  cyclobenzaprine (FLEXERIL) oxyCODONE (OXY IR/ROXICODONE)  Follow your surgeon's instructions on when to stop Aspirin.  If no instructions were given by your surgeon then you will need to call the office to get those instructions.     As of today, STOP taking any Aspirin (unless otherwise instructed by your surgeon) Aleve, Naproxen, Ibuprofen, Motrin, Advil, Goody's, BC's, all herbal medications, fish oil, and all vitamins.   WHAT DO I DO ABOUT MY DIABETES MEDICATION?   Do not take glipiZIDE (GLUCOTROL XL) the night before surgery and the morning of surgery.  Do not take metFORMIN (GLUCOPHAGE) the morning of  surgery  Do not take pioglitazone (ACTOS) the morning of surgery   HOW TO MANAGE YOUR DIABETES BEFORE AND AFTER SURGERY  Why is it important to control my blood sugar before and after surgery? Improving blood sugar levels before and after surgery helps healing and can limit problems. A way of improving blood sugar control is eating a healthy diet by:  Eating less sugar and carbohydrates  Increasing activity/exercise  Talking with your doctor about reaching your blood sugar goals High blood sugars (greater than 180 mg/dL) can raise your risk of infections and slow your recovery, so you will need to focus on controlling your diabetes during the weeks before surgery. Make sure that the doctor who takes care of your diabetes knows about your planned surgery including the date and location.  How do I manage my blood sugar before surgery? Check your blood sugar at least 4 times a day, starting 2 days before surgery, to make sure that the level is not too high or low.  Check your blood sugar the morning of your surgery when you wake up and every 2 hours until you get to the Short Stay unit.  If your blood sugar is less than 70 mg/dL, you will need to treat for low blood sugar: Do not take insulin. Treat a low blood sugar (less than 70 mg/dL) with  cup of clear juice (cranberry or apple), 4 glucose tablets, OR glucose gel. Recheck blood sugar in 15 minutes after treatment (to make sure it is  greater than 70 mg/dL). If your blood sugar is not greater than 70 mg/dL on recheck, call [phone] for further instructions. Report your blood sugar to the short stay nurse when you get to Short Stay.  If you are admitted to the hospital after surgery: Your blood sugar will be checked by the staff and you will probably be given insulin after surgery (instead of oral diabetes medicines) to make sure you have good blood sugar levels. The goal for blood sugar control after surgery is 80-180  mg/dL.   After your COVID test   You are not required to quarantine however you are required to wear a well-fitting mask when you are out and around people not in your household.  If your mask becomes wet or soiled, replace with a new one.  Wash your hands often with soap and water for 20 seconds or clean your hands with an alcohol-based hand sanitizer that contains at least 60% alcohol.  Do not share personal items.  Notify your provider: if you are in close contact with someone who has COVID  or if you develop a fever of 100.4 or greater, sneezing, cough, sore throat, shortness of breath or body aches.    The day of surgery:          Do not wear jewelry  Do not wear lotions, powders, colognes, or deodorant. Men may shave face and neck. Do not bring valuables to the hospital.              [facility] is not responsible for any belongings or valuables.  Do NOT Smoke (Tobacco/Vaping)  24 hours prior to your procedure  If you use a CPAP at night, you may bring your mask for your overnight stay.   Contacts, glasses, hearing aids, dentures or partials may not be worn into surgery, please bring cases for these belongings   For patients admitted to the hospital, discharge time will be determined by your treatment team.   Patients discharged the day of surgery will not be allowed to drive home, and someone needs to stay with them for 24 hours.  NO VISITORS WILL BE ALLOWED IN PRE-OP WHERE PATIENTS ARE PREPPED FOR SURGERY.  ONLY 1 SUPPORT PERSON MAY BE PRESENT IN THE WAITING ROOM WHILE YOU ARE IN SURGERY.  IF YOU ARE TO BE ADMITTED, ONCE YOU ARE IN YOUR ROOM YOU WILL BE ALLOWED TWO (2) VISITORS. 1 (ONE) VISITOR MAY STAY OVERNIGHT BUT MUST ARRIVE TO THE ROOM BY 8pm.  Minor children may have two parents present. Special consideration for safety and communication needs will be reviewed on a case by case basis.  Special instructions:    Oral Hygiene is also important to reduce your risk of  infection.  Remember - BRUSH YOUR TEETH THE MORNING OF SURGERY WITH YOUR REGULAR TOOTHPASTE   [place]- Preparing For Surgery  Before surgery, you can play an important role. Because skin is not sterile, your skin needs to be as free of germs as possible. You can reduce the number of germs on your skin by washing with CHG (chlorahexidine gluconate) Soap before surgery.  CHG is an antiseptic cleaner which kills germs and bonds with the skin to continue killing germs even after washing.     Please do not use if you have an allergy to CHG or antibacterial soaps. If your skin becomes reddened/irritated stop using the CHG.  Do not shave (including legs and underarms) for at least 48 hours prior to first CHG  shower. It is OK to shave your face.  Please follow these instructions carefully.     Shower the NIGHT BEFORE SURGERY and the MORNING OF SURGERY with CHG Soap.   If you chose to wash your hair, wash your hair first as usual with your normal shampoo. After you shampoo, rinse your hair and body thoroughly to remove the shampoo.  Then [employer] and genitals (private parts) with your normal soap and rinse thoroughly to remove soap.  After that Use CHG Soap as you would any other liquid soap. You can apply CHG directly to the skin and wash gently with a scrungie or a clean washcloth.   Apply the CHG Soap to your body ONLY FROM THE NECK DOWN.  Do not use on open wounds or open sores. Avoid contact with your eyes, ears, mouth and genitals (private parts). Wash Face and genitals (private parts)  with your normal soap.   Wash thoroughly, paying special attention to the area where your surgery will be performed.  Thoroughly rinse your body with warm water from the neck down.  DO NOT shower/wash with your normal soap after using and rinsing off the CHG Soap.  Pat yourself dry with a CLEAN TOWEL.  Wear CLEAN PAJAMAS to bed the night before surgery  Place CLEAN SHEETS on your bed the night before  your surgery  DO NOT SLEEP WITH PETS.   Day of Surgery:  Take a shower with CHG soap. Wear Clean/Comfortable clothing the morning of surgery Do not apply any deodorants/lotions.   Remember to brush your teeth WITH YOUR REGULAR TOOTHPASTE.   Please read over the following fact sheets that you were given.

## 2021-02-27 ENCOUNTER — Inpatient Hospital Stay (HOSPITAL_COMMUNITY)
Admission: RE | Admit: 2021-02-27 | Discharge: 2021-02-27 | Disposition: A | Discharge status: Home / Self Care | Payer: No Typology Code available for payment source | Source: Ambulatory Visit

## 2021-02-27 NOTE — Progress Notes (Signed)
Patient had a PAT appointment at 3 PM today but he didn't come. This Clinical research associate called the patient at 15:25 and patient verbalized that he had another appointment before this one and his raid took him to a wrong place. Patient verbalized that he is at the orthopedic center in [place]. This Clinical research associate explained to the patient that his appointment will be rescheduled for another day before his surgery, and our surgical scheduler will call him back to decide when he will be able to come for his PAT appointment. The surgical scheduler was notified.

## 2021-03-01 ENCOUNTER — Other Ambulatory Visit: Payer: Self-pay

## 2021-03-01 ENCOUNTER — Encounter (HOSPITAL_COMMUNITY): Payer: Self-pay | Admitting: Orthopedic Surgery

## 2021-03-01 NOTE — Progress Notes (Signed)
[facility]  Cardiologist - Denies  EP- Denies  Endocrine- Denies  Pulm- Denies  Chest x-ray -  Denies  EKG - 06/01/20 (CE)- req'd  Stress Test - 11/03/19 (CE)  ECHO - 08/28/19 (CE)  Cardiac Cath -  Denies  AICD-na PM-na LOOP-na  Dialysis- Denies  Sleep Study - Yes- Positive CPAP -  Denies  LABS- 02/01/21 (CE): CBC, CMP  ASA- Denies  ERAS- Yes, until 0430  HA1C- 01/13/21 (CE): 5.9 Fasting Blood Sugar - 79-119 Checks Blood Sugar ___1__ time a day  Anesthesia- Yes- EKG tracing requested  Pt denies having chest pain, sob, or fever during the pre-op phone call. All instructions explained to the pt, with a verbal understanding of the material including: as of today, stop taking all Aspirin (unless instructed by your doctor) and Other Aspirin containing products, Vitamins, Fish oils, and Herbal medications. Also stop all NSAIDS i.e. Advil, Ibuprofen, Motrin, Aleve, Anaprox, Naproxen, BC, Goody Powders, and all Supplements.    WHAT DO I DO ABOUT MY DIABETES MEDICATION?  Do not take Metformin and Rybelsus the morning of surgery.  If your CBG is greater than 220 mg/dL, inform the staff upon arrival to Short Stay.   How do I manage my blood sugar before surgery? Check your blood sugar the morning of your surgery when you wake up and every 2 hours until you get to the Short Stay unit. If your blood sugar is less than 70 mg/dL, you will need to treat for low blood sugar: Do not take insulin. Treat a low blood sugar (less than 70 mg/dL) with  cup of clear juice (cranberry or apple), 4 glucose tablets, OR glucose gel. Recheck blood sugar in 15 minutes after treatment (to make sure it is greater than 70 mg/dL). If your blood sugar is not greater than 70 mg/dL on recheck, call [phone]  for further instructions. Report your blood sugar to the short stay nurse when you get to Short Stay.  Reviewed and Endorsed by [facility] Patient Education Committee,  August 2015  Pt also instructed to wear a mask and social distance if he has to go out. The opportunity to ask questions was provided.    Coronavirus Screening  Have you experienced the following symptoms:  Cough yes/no: No Fever (>100.33F)  yes/no: No Runny nose yes/no: No Sore throat yes/no: No Difficulty breathing/shortness of breath  yes/no: No  Have you or a family member traveled in the last 14 days and where? yes/no: No   If the patient indicates "YES" to the above questions, their PAT will be rescheduled to limit the exposure to others and, the surgeon will be notified. THE PATIENT WILL NEED TO BE ASYMPTOMATIC FOR 14 DAYS.   If the patient is not experiencing any of these symptoms, the PAT nurse will instruct them to NOT bring anyone with them to their appointment since they may have these symptoms or traveled as well.   Please remind your patients and families that hospital visitation restrictions are in effect and the importance of the restrictions.

## 2021-03-01 NOTE — Anesthesia Preprocedure Evaluation (Addendum)
Anesthesia Evaluation  Patient identified by MRN, date of birth, ID band Patient awake    Reviewed: Allergy & Precautions, NPO status , Patient's Chart, lab work & pertinent test results  History of Anesthesia Complications Negative for: history of anesthetic complications  Airway Mallampati: IV  TM Distance: >3 FB Neck ROM: Full    Dental  (+) Dental Advisory Given, Teeth Intact,    Pulmonary sleep apnea (no CPAP) , former smoker,    breath sounds clear to auscultation       Cardiovascular hypertension, Pt. on medications and Pt. on home beta blockers (-) angina(-) CHF (-) dysrhythmias  Rhythm:Regular     Neuro/Psych negative neurological ROS  negative psych ROS   GI/Hepatic Neg liver ROS, GERD  Controlled and Medicated,  Endo/Other  diabetes, Well Controlled, Type 2, Oral Hypoglycemic AgentsObesity BMI 35 a1c 5.9  Renal/GU Renal InsufficiencyRenal diseaseCr 1.25     Musculoskeletal negative musculoskeletal ROS (+)   Abdominal (+) + obese,   Peds  Hematology negative hematology ROS (+)   Anesthesia Other Findings   Reproductive/Obstetrics                           Anesthesia Physical Anesthesia Plan  ASA: 2  Anesthesia Plan: General and Regional   Post-op Pain Management: Regional block and Tylenol PO (pre-op)   Induction: Intravenous  PONV Risk Score and Plan: Ondansetron, Dexamethasone and Treatment may vary due to age or medical condition  Airway Management Planned: LMA and Oral ETT  Additional Equipment: None  Intra-op Plan:   Post-operative Plan: Extubation in OR  Informed Consent: I have reviewed the patients History and Physical, chart, labs and discussed the procedure including the risks, benefits and alternatives for the proposed anesthesia with the patient or authorized representative who has indicated his/her understanding and acceptance.     Dental advisory  given  Plan Discussed with: CRNA  Anesthesia Plan Comments: (PAT note by Antionette Poles, PA-C: DM2 well-controlled, A1c 5.9 on 01/13/2021 per labs in Care Everywhere.  History of stress echocardiogram August 2021 that was negative for ischemia, showed normal augmentation of all left ventricular wall segments.  CMP 02/01/2021 CBC and CMP 02/01/2021 in Care Everywhere were reviewed.  CBC unremarkable.  CMP with mildly elevated creatinine 1.36, otherwise unremarkable.  EKG 06/01/2020 (Care Everywhere, tracing requested): Sinus rhythm.  Rate 60.  Possible LAE.  Stress echo 11/03/2019 (Care Everywhere): SUMMARY  The patient had no chest pain during stress  The patient achieved 85 % ofmaximum predicted heart rate.  Normal left ventricular function at rest.  Negative dobutamine echocardiography for inducible ischemia at target  heart rate.  There was normal augmentation of all left ventricular wall segments  with dobutamine.  )      Anesthesia Quick Evaluation

## 2021-03-01 NOTE — Progress Notes (Signed)
Anesthesia Chart Review: Same day workup  DM2 well-controlled, A1c 5.9 on 01/13/2021 per labs in Care Everywhere.  History of stress echocardiogram August 2021 that was negative for ischemia, showed normal augmentation of all left ventricular wall segments.  CMP 02/01/2021 CBC and CMP 02/01/2021 in Care Everywhere were reviewed.  CBC unremarkable.  CMP with mildly elevated creatinine 1.36, otherwise unremarkable.  EKG 06/01/2020 (Care Everywhere, tracing requested): Sinus rhythm.  Rate 60.  Possible LAE.  Stress echo 11/03/2019 (Care Everywhere): SUMMARY  The patient had no chest pain during stress  The patient achieved 85 % of maximum predicted heart rate.  Normal left ventricular function at rest.  Negative dobutamine echocardiography for inducible ischemia at target  heart rate.  There was normal augmentation of all left ventricular wall segments  with dobutamine.     Zannie Cove [facility] Short Stay Center/Anesthesiology Phone [phone] 03/01/2021 2:00 PM

## 2021-03-02 ENCOUNTER — Encounter (HOSPITAL_COMMUNITY): Payer: Self-pay | Admitting: Orthopedic Surgery

## 2021-03-02 ENCOUNTER — Inpatient Hospital Stay (HOSPITAL_COMMUNITY): Payer: No Typology Code available for payment source | Admitting: Physician Assistant

## 2021-03-02 ENCOUNTER — Encounter (HOSPITAL_COMMUNITY)
Admission: RE | Disposition: A | Discharge status: Skilled Nursing Facility | Payer: Self-pay | Source: Home / Self Care | Attending: Orthopedic Surgery

## 2021-03-02 ENCOUNTER — Inpatient Hospital Stay (HOSPITAL_COMMUNITY): Payer: No Typology Code available for payment source

## 2021-03-02 ENCOUNTER — Inpatient Hospital Stay (HOSPITAL_COMMUNITY)
Admission: RE | Admit: 2021-03-02 | Discharge: 2021-03-17 | DRG: 496 | Disposition: A | Discharge status: Skilled Nursing Facility | Payer: No Typology Code available for payment source | Attending: Orthopedic Surgery | Admitting: Orthopedic Surgery

## 2021-03-02 DIAGNOSIS — Z6835 Body mass index (BMI) 35.0-35.9, adult: Secondary | ICD-10-CM

## 2021-03-02 DIAGNOSIS — N179 Acute kidney failure, unspecified: Secondary | ICD-10-CM | POA: Diagnosis present

## 2021-03-02 DIAGNOSIS — M960 Pseudarthrosis after fusion or arthrodesis: Secondary | ICD-10-CM | POA: Diagnosis present

## 2021-03-02 DIAGNOSIS — E669 Obesity, unspecified: Secondary | ICD-10-CM | POA: Diagnosis present

## 2021-03-02 DIAGNOSIS — K219 Gastro-esophageal reflux disease without esophagitis: Secondary | ICD-10-CM | POA: Diagnosis present

## 2021-03-02 DIAGNOSIS — E876 Hypokalemia: Secondary | ICD-10-CM | POA: Diagnosis present

## 2021-03-02 DIAGNOSIS — E860 Dehydration: Secondary | ICD-10-CM | POA: Diagnosis present

## 2021-03-02 DIAGNOSIS — I10 Essential (primary) hypertension: Secondary | ICD-10-CM | POA: Diagnosis present

## 2021-03-02 DIAGNOSIS — Y831 Surgical operation with implant of artificial internal device as the cause of abnormal reaction of the patient, or of later complication, without mention of misadventure at the time of the procedure: Secondary | ICD-10-CM | POA: Diagnosis present

## 2021-03-02 DIAGNOSIS — E871 Hypo-osmolality and hyponatremia: Secondary | ICD-10-CM | POA: Diagnosis present

## 2021-03-02 DIAGNOSIS — Z8249 Family history of ischemic heart disease and other diseases of the circulatory system: Secondary | ICD-10-CM

## 2021-03-02 DIAGNOSIS — E119 Type 2 diabetes mellitus without complications: Secondary | ICD-10-CM | POA: Diagnosis present

## 2021-03-02 DIAGNOSIS — I959 Hypotension, unspecified: Secondary | ICD-10-CM | POA: Diagnosis present

## 2021-03-02 DIAGNOSIS — Z20822 Contact with and (suspected) exposure to covid-19: Secondary | ICD-10-CM | POA: Diagnosis present

## 2021-03-02 DIAGNOSIS — F419 Anxiety disorder, unspecified: Secondary | ICD-10-CM | POA: Diagnosis present

## 2021-03-02 DIAGNOSIS — Z79899 Other long term (current) drug therapy: Secondary | ICD-10-CM

## 2021-03-02 DIAGNOSIS — T8484XA Pain due to internal orthopedic prosthetic devices, implants and grafts, initial encounter: Secondary | ICD-10-CM | POA: Diagnosis present

## 2021-03-02 DIAGNOSIS — Z794 Long term (current) use of insulin: Secondary | ICD-10-CM

## 2021-03-02 DIAGNOSIS — Y838 Other surgical procedures as the cause of abnormal reaction of the patient, or of later complication, without mention of misadventure at the time of the procedure: Secondary | ICD-10-CM | POA: Diagnosis present

## 2021-03-02 DIAGNOSIS — Z87891 Personal history of nicotine dependence: Secondary | ICD-10-CM

## 2021-03-02 DIAGNOSIS — R5381 Other malaise: Secondary | ICD-10-CM | POA: Diagnosis present

## 2021-03-02 DIAGNOSIS — E861 Hypovolemia: Secondary | ICD-10-CM | POA: Diagnosis present

## 2021-03-02 DIAGNOSIS — E782 Mixed hyperlipidemia: Secondary | ICD-10-CM | POA: Diagnosis present

## 2021-03-02 DIAGNOSIS — G4730 Sleep apnea, unspecified: Secondary | ICD-10-CM | POA: Diagnosis present

## 2021-03-02 HISTORY — PX: ANKLE FUSION: SHX881

## 2021-03-02 LAB — CREATININE, SERUM
Creatinine, Ser: 1.35 mg/dL — ABNORMAL HIGH (ref 0.61–1.24)
GFR, Estimated: >60 mL/min (ref >60)

## 2021-03-02 LAB — CBC
HCT: 36.9 % — ABNORMAL LOW (ref 39.0–52.0)
Hemoglobin: 12.5 g/dL — ABNORMAL LOW (ref 13.0–17.0)
MCH: 32.1 pg (ref 26.0–34.0)
MCHC: 33.9 g/dL (ref 30.0–36.0)
MCV: 94.9 fL (ref 80.0–100.0)
Platelets: 77 10*3/uL — ABNORMAL LOW (ref 150–400)
RBC: 3.89 MIL/uL — ABNORMAL LOW (ref 4.22–5.81)
RDW: 12.5 % (ref 11.5–15.5)
WBC: 6.8 10*3/uL (ref 4.0–10.5)
nRBC: 0 % (ref 0.0–0.2)

## 2021-03-02 LAB — HEMOGLOBIN A1C
Hgb A1c MFr Bld: 5.5 % (ref 4.8–5.6)
Mean Plasma Glucose: 111.15 mg/dL

## 2021-03-02 LAB — GLUCOSE, CAPILLARY
Glucose-Capillary: 115 mg/dL — ABNORMAL HIGH (ref 70–99)
Glucose-Capillary: 140 mg/dL — ABNORMAL HIGH (ref 70–99)
Glucose-Capillary: 133 mg/dL — ABNORMAL HIGH (ref 70–99)
Glucose-Capillary: 111 mg/dL — ABNORMAL HIGH (ref 70–99)

## 2021-03-02 LAB — SARS CORONAVIRUS 2 BY RT PCR (HOSPITAL ORDER, PERFORMED IN ~~LOC~~ HOSPITAL LAB): SARS Coronavirus 2: NEGATIVE

## 2021-03-02 LAB — SURGICAL PCR SCREEN
MRSA, PCR: NEGATIVE
Staphylococcus aureus: NEGATIVE

## 2021-03-02 SURGERY — ARTHRODESIS ANKLE
Anesthesia: Regional | Site: Ankle | Laterality: Left

## 2021-03-02 MED ORDER — FENTANYL CITRATE (PF) 100 MCG/2ML IJ SOLN
INTRAMUSCULAR | Status: AC
  Administered 2021-03-02: 100 ug via INTRAVENOUS
  Filled 2021-03-02: qty 2

## 2021-03-02 MED ORDER — INSULIN ASPART 100 UNIT/ML IJ SOLN
0.0000 [IU] | Freq: Three times a day (TID) | INTRAMUSCULAR | Status: DC
  Administered 2021-03-02 – 2021-03-07 (×4): 3 [IU] via SUBCUTANEOUS

## 2021-03-02 MED ORDER — PROPOFOL 10 MG/ML IV BOLUS
INTRAVENOUS | Status: DC | PRN
  Administered 2021-03-02 (×2): 150 mg via INTRAVENOUS

## 2021-03-02 MED ORDER — OXYCODONE HCL 5 MG PO TABS
5.0000 mg | ORAL_TABLET | Freq: Once | ORAL | Status: AC | PRN
  Administered 2021-03-02: 5 mg via ORAL

## 2021-03-02 MED ORDER — MIDAZOLAM HCL 2 MG/2ML IJ SOLN
INTRAMUSCULAR | Status: AC
  Administered 2021-03-02: 2 mg via INTRAVENOUS
  Filled 2021-03-02: qty 2

## 2021-03-02 MED ORDER — LISINOPRIL 20 MG PO TABS
40.0000 mg | ORAL_TABLET | Freq: Every day | ORAL | Status: DC
  Administered 2021-03-02 – 2021-03-09 (×8): 40 mg via ORAL
  Filled 2021-03-02 (×8): qty 2

## 2021-03-02 MED ORDER — DEXAMETHASONE SODIUM PHOSPHATE 4 MG/ML IJ SOLN
INTRAMUSCULAR | Status: DC | PRN
  Administered 2021-03-02: 10 mg via INTRAVENOUS

## 2021-03-02 MED ORDER — ONDANSETRON HCL 4 MG/2ML IJ SOLN
INTRAMUSCULAR | Status: DC | PRN
  Administered 2021-03-02: 4 mg via INTRAVENOUS

## 2021-03-02 MED ORDER — ORAL CARE MOUTH RINSE: 15.0000 mL | Freq: Once | OROMUCOSAL | Status: AC

## 2021-03-02 MED ORDER — FENTANYL CITRATE (PF) 250 MCG/5ML IJ SOLN
INTRAMUSCULAR | Status: AC
  Filled 2021-03-02: qty 5

## 2021-03-02 MED ORDER — SODIUM CHLORIDE 0.9 % IV SOLN: INTRAVENOUS | Status: DC

## 2021-03-02 MED ORDER — CARVEDILOL 6.25 MG PO TABS
6.2500 mg | ORAL_TABLET | Freq: Two times a day (BID) | ORAL | Status: DC
  Administered 2021-03-02 – 2021-03-09 (×15): 6.25 mg via ORAL
  Filled 2021-03-02 (×15): qty 1

## 2021-03-02 MED ORDER — MAGNESIUM OXIDE 400 MG PO TABS
400.0000 mg | ORAL_TABLET | Freq: Every day | ORAL | Status: DC
  Filled 2021-03-02: qty 1

## 2021-03-02 MED ORDER — LIDOCAINE-EPINEPHRINE (PF) 1.5 %-1:200000 IJ SOLN
INTRAMUSCULAR | Status: DC | PRN
  Administered 2021-03-02: 5 mL via PERINEURAL

## 2021-03-02 MED ORDER — HYDROMORPHONE HCL 1 MG/ML IJ SOLN
0.5000 mg | INTRAMUSCULAR | Status: DC | PRN
  Administered 2021-03-02 – 2021-03-07 (×20): 1 mg via INTRAVENOUS
  Administered 2021-03-07: 0.5 mg via INTRAVENOUS
  Administered 2021-03-07 – 2021-03-08 (×6): 1 mg via INTRAVENOUS
  Administered 2021-03-09 (×3): 0.5 mg via INTRAVENOUS
  Administered 2021-03-09 – 2021-03-10 (×6): 1 mg via INTRAVENOUS
  Administered 2021-03-11: 0.5 mg via INTRAVENOUS
  Administered 2021-03-11 – 2021-03-17 (×24): 1 mg via INTRAVENOUS
  Filled 2021-03-02 (×67): qty 1

## 2021-03-02 MED ORDER — ENOXAPARIN SODIUM 40 MG/0.4ML IJ SOSY
40.0000 mg | PREFILLED_SYRINGE | INTRAMUSCULAR | Status: DC
  Administered 2021-03-03 – 2021-03-17 (×14): 40 mg via SUBCUTANEOUS
  Filled 2021-03-02 (×14): qty 0.4

## 2021-03-02 MED ORDER — BUPIVACAINE-EPINEPHRINE (PF) 0.5% -1:200000 IJ SOLN
INTRAMUSCULAR | Status: DC | PRN
  Administered 2021-03-02: 25 mL via PERINEURAL

## 2021-03-02 MED ORDER — PHENYLEPHRINE 40 MCG/ML (10ML) SYRINGE FOR IV PUSH (FOR BLOOD PRESSURE SUPPORT)
PREFILLED_SYRINGE | INTRAVENOUS | Status: DC | PRN
  Administered 2021-03-02: 80 ug via INTRAVENOUS

## 2021-03-02 MED ORDER — DOCUSATE SODIUM 100 MG PO CAPS
100.0000 mg | ORAL_CAPSULE | Freq: Two times a day (BID) | ORAL | Status: DC
  Administered 2021-03-02 – 2021-03-17 (×30): 100 mg via ORAL
  Filled 2021-03-02 (×30): qty 1

## 2021-03-02 MED ORDER — METFORMIN HCL ER 500 MG PO TB24
1000.0000 mg | ORAL_TABLET | Freq: Two times a day (BID) | ORAL | Status: DC
  Administered 2021-03-02 – 2021-03-09 (×14): 1000 mg via ORAL
  Filled 2021-03-02 (×14): qty 2

## 2021-03-02 MED ORDER — FENTANYL CITRATE (PF) 100 MCG/2ML IJ SOLN: 100.0000 ug | Freq: Once | INTRAMUSCULAR | Status: AC

## 2021-03-02 MED ORDER — SODIUM CHLORIDE 0.9 % IR SOLN
Status: DC | PRN
  Administered 2021-03-02: 1000 mL

## 2021-03-02 MED ORDER — FENTANYL CITRATE (PF) 250 MCG/5ML IJ SOLN
INTRAMUSCULAR | Status: DC | PRN
  Administered 2021-03-02 (×2): 50 ug via INTRAVENOUS

## 2021-03-02 MED ORDER — CHLORTHALIDONE 25 MG PO TABS
25.0000 mg | ORAL_TABLET | Freq: Every day | ORAL | Status: DC
  Administered 2021-03-02 – 2021-03-09 (×8): 25 mg via ORAL
  Filled 2021-03-02 (×8): qty 1

## 2021-03-02 MED ORDER — CARVEDILOL 3.125 MG PO TABS
ORAL_TABLET | ORAL | Status: AC
  Filled 2021-03-02: qty 2

## 2021-03-02 MED ORDER — PROPOFOL 10 MG/ML IV BOLUS
INTRAVENOUS | Status: AC
  Filled 2021-03-02: qty 20

## 2021-03-02 MED ORDER — CHLORHEXIDINE GLUCONATE 0.12 % MT SOLN
OROMUCOSAL | Status: AC
  Administered 2021-03-02: 15 mL via OROMUCOSAL
  Filled 2021-03-02: qty 15

## 2021-03-02 MED ORDER — SENNA 8.6 MG PO TABS
1.0000 | ORAL_TABLET | Freq: Two times a day (BID) | ORAL | Status: DC
  Administered 2021-03-02 – 2021-03-17 (×28): 8.6 mg via ORAL
  Filled 2021-03-02 (×27): qty 1

## 2021-03-02 MED ORDER — MIDAZOLAM HCL 2 MG/2ML IJ SOLN: 2.0000 mg | Freq: Once | INTRAMUSCULAR | Status: AC

## 2021-03-02 MED ORDER — CEFAZOLIN IN SODIUM CHLORIDE 3-0.9 GM/100ML-% IV SOLN
3.0000 g | INTRAVENOUS | Status: AC
  Administered 2021-03-02: 3 g via INTRAVENOUS
  Filled 2021-03-02: qty 100

## 2021-03-02 MED ORDER — HYDROMORPHONE HCL 1 MG/ML IJ SOLN
INTRAMUSCULAR | Status: AC
  Filled 2021-03-02: qty 1

## 2021-03-02 MED ORDER — ACETAMINOPHEN 325 MG PO TABS
325.0000 mg | ORAL_TABLET | Freq: Four times a day (QID) | ORAL | Status: DC | PRN
  Administered 2021-03-02 – 2021-03-11 (×3): 650 mg via ORAL
  Filled 2021-03-02 (×4): qty 2

## 2021-03-02 MED ORDER — ACETAMINOPHEN 500 MG PO TABS
1000.0000 mg | ORAL_TABLET | Freq: Once | ORAL | Status: AC
  Administered 2021-03-02: 1000 mg via ORAL
  Filled 2021-03-02: qty 2

## 2021-03-02 MED ORDER — VANCOMYCIN HCL 500 MG IV SOLR
INTRAVENOUS | Status: AC
  Filled 2021-03-02: qty 10

## 2021-03-02 MED ORDER — VANCOMYCIN HCL 500 MG IV SOLR
INTRAVENOUS | Status: DC | PRN
  Administered 2021-03-02: 500 mg

## 2021-03-02 MED ORDER — SUGAMMADEX SODIUM 200 MG/2ML IV SOLN
INTRAVENOUS | Status: DC | PRN
  Administered 2021-03-02: 400 mg via INTRAVENOUS

## 2021-03-02 MED ORDER — CHLORHEXIDINE GLUCONATE 0.12 % MT SOLN: 15.0000 mL | Freq: Once | OROMUCOSAL | Status: AC

## 2021-03-02 MED ORDER — OXYCODONE HCL 5 MG/5ML PO SOLN: 5.0000 mg | Freq: Once | ORAL | Status: AC | PRN

## 2021-03-02 MED ORDER — OXYCODONE HCL 5 MG PO TABS
5.0000 mg | ORAL_TABLET | ORAL | Status: DC | PRN
  Administered 2021-03-15: 04:00:00 10 mg via ORAL
  Filled 2021-03-02: qty 2

## 2021-03-02 MED ORDER — MAGNESIUM OXIDE -MG SUPPLEMENT 400 (240 MG) MG PO TABS
400.0000 mg | ORAL_TABLET | Freq: Every day | ORAL | Status: DC
  Administered 2021-03-02 – 2021-03-17 (×15): 400 mg via ORAL
  Filled 2021-03-02 (×15): qty 1

## 2021-03-02 MED ORDER — AMLODIPINE BESYLATE 10 MG PO TABS
10.0000 mg | ORAL_TABLET | Freq: Every day | ORAL | Status: DC
  Administered 2021-03-02 – 2021-03-09 (×8): 10 mg via ORAL
  Filled 2021-03-02 (×8): qty 1

## 2021-03-02 MED ORDER — CARVEDILOL 3.125 MG PO TABS
6.2500 mg | ORAL_TABLET | Freq: Once | ORAL | Status: AC
  Administered 2021-03-02: 6.25 mg via ORAL

## 2021-03-02 MED ORDER — OXYCODONE HCL 5 MG PO TABS
10.0000 mg | ORAL_TABLET | ORAL | Status: DC | PRN
  Administered 2021-03-02 (×3): 15 mg via ORAL
  Administered 2021-03-03: 10 mg via ORAL
  Administered 2021-03-03 – 2021-03-16 (×21): 15 mg via ORAL
  Filled 2021-03-02 (×17): qty 3
  Filled 2021-03-02: qty 2
  Filled 2021-03-02 (×10): qty 3

## 2021-03-02 MED ORDER — PROMETHAZINE HCL 25 MG/ML IJ SOLN: 6.2500 mg | INTRAMUSCULAR | Status: DC | PRN

## 2021-03-02 MED ORDER — HYDROMORPHONE HCL 1 MG/ML IJ SOLN
0.2500 mg | INTRAMUSCULAR | Status: DC | PRN
  Administered 2021-03-02 (×4): 0.5 mg via INTRAVENOUS

## 2021-03-02 MED ORDER — DIPHENHYDRAMINE HCL 12.5 MG/5ML PO ELIX: 12.5000 mg | ORAL_SOLUTION | ORAL | Status: DC | PRN

## 2021-03-02 MED ORDER — LACTATED RINGERS IV SOLN: INTRAVENOUS | Status: DC

## 2021-03-02 MED ORDER — PANTOPRAZOLE SODIUM 40 MG PO TBEC
40.0000 mg | DELAYED_RELEASE_TABLET | Freq: Every day | ORAL | Status: DC
  Administered 2021-03-02 – 2021-03-17 (×15): 40 mg via ORAL
  Filled 2021-03-02 (×15): qty 1

## 2021-03-02 MED ORDER — SEMAGLUTIDE 7 MG PO TABS: 7.0000 mg | ORAL_TABLET | Freq: Every day | ORAL | Status: DC

## 2021-03-02 MED ORDER — ROCURONIUM BROMIDE 10 MG/ML (PF) SYRINGE
PREFILLED_SYRINGE | INTRAVENOUS | Status: DC | PRN
  Administered 2021-03-02 (×2): 30 mg via INTRAVENOUS

## 2021-03-02 MED ORDER — ATORVASTATIN CALCIUM 40 MG PO TABS
40.0000 mg | ORAL_TABLET | Freq: Every day | ORAL | Status: DC
  Administered 2021-03-02 – 2021-03-17 (×15): 40 mg via ORAL
  Filled 2021-03-02 (×15): qty 1

## 2021-03-02 MED ORDER — OXYCODONE HCL 5 MG PO TABS
ORAL_TABLET | ORAL | Status: AC
  Filled 2021-03-02: qty 1

## 2021-03-02 MED ORDER — ONDANSETRON HCL 4 MG/2ML IJ SOLN
4.0000 mg | Freq: Four times a day (QID) | INTRAMUSCULAR | Status: DC | PRN
  Administered 2021-03-06 – 2021-03-08 (×2): 4 mg via INTRAVENOUS
  Filled 2021-03-02 (×2): qty 2

## 2021-03-02 MED ORDER — ONDANSETRON HCL 4 MG PO TABS
4.0000 mg | ORAL_TABLET | Freq: Four times a day (QID) | ORAL | Status: DC | PRN
  Administered 2021-03-07: 4 mg via ORAL
  Filled 2021-03-02: qty 1

## 2021-03-02 MED ORDER — SUCCINYLCHOLINE CHLORIDE 200 MG/10ML IV SOSY
PREFILLED_SYRINGE | INTRAVENOUS | Status: DC | PRN
  Administered 2021-03-02: 140 mg via INTRAVENOUS

## 2021-03-02 MED ORDER — METHOCARBAMOL 1000 MG/10ML IJ SOLN
500.0000 mg | Freq: Four times a day (QID) | INTRAVENOUS | Status: DC | PRN
  Filled 2021-03-02: qty 5

## 2021-03-02 MED ORDER — METHOCARBAMOL 500 MG PO TABS
500.0000 mg | ORAL_TABLET | Freq: Four times a day (QID) | ORAL | Status: DC | PRN
  Administered 2021-03-02 – 2021-03-06 (×8): 500 mg via ORAL
  Filled 2021-03-02 (×8): qty 1

## 2021-03-02 SURGICAL SUPPLY — 70 items
BAG COUNTER SPONGE SURGICOUNT (BAG) ×2 IMPLANT
BAG SURGICOUNT SPONGE COUNTING (BAG) ×1
BANDAGE ESMARK 6X9 LF (GAUZE/BANDAGES/DRESSINGS) IMPLANT
BIT DRILL 4.8X200 CANN (BIT) ×3 IMPLANT
BLADE SAW SGTL HD 18.5X60.5X1. (BLADE) ×3 IMPLANT
BLADE SURG 10 STRL SS (BLADE) ×3 IMPLANT
BNDG COHESIVE 4X5 TAN STRL (GAUZE/BANDAGES/DRESSINGS) IMPLANT
BNDG COHESIVE 6X5 TAN STRL LF (GAUZE/BANDAGES/DRESSINGS) IMPLANT
BNDG ELASTIC 4X5.8 VLCR STR LF (GAUZE/BANDAGES/DRESSINGS) ×3 IMPLANT
BNDG ELASTIC 6X5.8 VLCR STR LF (GAUZE/BANDAGES/DRESSINGS) ×3 IMPLANT
BNDG ESMARK 6X9 LF (GAUZE/BANDAGES/DRESSINGS)
BONE GRAFT AUGMENT 3CC (Orthopedic Implant) ×3 IMPLANT
CANISTER SUCT 3000ML PPV (MISCELLANEOUS) IMPLANT
CHLORAPREP W/TINT 10.5 ML (MISCELLANEOUS) ×3 IMPLANT
CHLORAPREP W/TINT 26 (MISCELLANEOUS) ×3 IMPLANT
CLOSURE WOUND 1/2 X4 (GAUZE/BANDAGES/DRESSINGS)
COVER MAYO STAND STRL (DRAPES) ×3 IMPLANT
COVER SURGICAL LIGHT HANDLE (MISCELLANEOUS) ×3 IMPLANT
CUFF TOURN SGL QUICK 34 (TOURNIQUET CUFF) ×2
CUFF TRNQT CYL 34X4.125X (TOURNIQUET CUFF) ×1 IMPLANT
DRAPE C-ARM 42X72 X-RAY (DRAPES) ×3 IMPLANT
DRAPE INCISE IOBAN 66X45 STRL (DRAPES) ×3 IMPLANT
DRAPE U-SHAPE 47X51 STRL (DRAPES) ×3 IMPLANT
DRSG ADAPTIC 3X8 NADH LF (GAUZE/BANDAGES/DRESSINGS) IMPLANT
DRSG MEPITEL 3X4 ME34 (GAUZE/BANDAGES/DRESSINGS) ×6 IMPLANT
DRSG PAD ABDOMINAL 8X10 ST (GAUZE/BANDAGES/DRESSINGS) ×3 IMPLANT
DRSG TEGADERM 4X4.75 (GAUZE/BANDAGES/DRESSINGS) IMPLANT
ELECT REM PT RETURN 9FT ADLT (ELECTROSURGICAL) ×3
ELECTRODE REM PT RTRN 9FT ADLT (ELECTROSURGICAL) ×1 IMPLANT
GAUZE SPONGE 2X2 8PLY STRL LF (GAUZE/BANDAGES/DRESSINGS) ×1 IMPLANT
GAUZE SPONGE 4X4 12PLY STRL (GAUZE/BANDAGES/DRESSINGS) ×3 IMPLANT
GLOVE SRG 8 PF TXTR STRL LF DI (GLOVE) ×2 IMPLANT
GLOVE SURG ENC MOIS LTX SZ8 (GLOVE) ×3 IMPLANT
GLOVE SURG LTX SZ8 (GLOVE) ×6 IMPLANT
GLOVE SURG UNDER POLY LF SZ8 (GLOVE) ×4
GOWN STRL REUS W/ TWL LRG LVL3 (GOWN DISPOSABLE) ×1 IMPLANT
GOWN STRL REUS W/ TWL XL LVL3 (GOWN DISPOSABLE) ×2 IMPLANT
GOWN STRL REUS W/TWL LRG LVL3 (GOWN DISPOSABLE) ×2
GOWN STRL REUS W/TWL XL LVL3 (GOWN DISPOSABLE) ×4
KIT BASIN OR (CUSTOM PROCEDURE TRAY) ×3 IMPLANT
KIT TURNOVER KIT B (KITS) ×3 IMPLANT
NEEDLE 22X1 1/2 (OR ONLY) (NEEDLE) IMPLANT
NS IRRIG 1000ML POUR BTL (IV SOLUTION) ×3 IMPLANT
PACK ORTHO EXTREMITY (CUSTOM PROCEDURE TRAY) ×3 IMPLANT
PAD ARMBOARD 7.5X6 YLW CONV (MISCELLANEOUS) ×6 IMPLANT
PAD CAST 4YDX4 CTTN HI CHSV (CAST SUPPLIES) ×1 IMPLANT
PADDING CAST COTTON 4X4 STRL (CAST SUPPLIES) ×2
PADDING CAST COTTON 6X4 STRL (CAST SUPPLIES) ×6 IMPLANT
PIN GUIDE THRD TT 3.8 (PIN) ×6 IMPLANT
SCREW CANNULATED 8.0X60MM (Screw) ×3 IMPLANT
SCREW CANNULATED 8.0X75MM (Screw) ×3 IMPLANT
SOAP 2 % CHG 4 OZ (WOUND CARE) ×3 IMPLANT
SPONGE GAUZE 2X2 STER 10/PKG (GAUZE/BANDAGES/DRESSINGS) ×2
SPONGE T-LAP 18X18 ~~LOC~~+RFID (SPONGE) ×3 IMPLANT
STAPLER VISISTAT 35W (STAPLE) IMPLANT
STRIP CLOSURE SKIN 1/2X4 (GAUZE/BANDAGES/DRESSINGS) IMPLANT
SUCTION FRAZIER HANDLE 10FR (MISCELLANEOUS) ×2
SUCTION TUBE FRAZIER 10FR DISP (MISCELLANEOUS) ×1 IMPLANT
SUT ETHILON 3 0 PS 1 (SUTURE) ×6 IMPLANT
SUT PROLENE 3 0 PS 2 (SUTURE) ×3 IMPLANT
SUT VIC AB 2-0 CT1 27 (SUTURE) ×4
SUT VIC AB 2-0 CT1 TAPERPNT 27 (SUTURE) ×2 IMPLANT
SUT VIC AB 3-0 PS2 18 (SUTURE) ×2
SUT VIC AB 3-0 PS2 18XBRD (SUTURE) ×1 IMPLANT
SYR CONTROL 10ML LL (SYRINGE) IMPLANT
TOWEL GREEN STERILE (TOWEL DISPOSABLE) ×3 IMPLANT
TOWEL GREEN STERILE FF (TOWEL DISPOSABLE) ×3 IMPLANT
TUBE CONNECTING 12'X1/4 (SUCTIONS) ×1
TUBE CONNECTING 12X1/4 (SUCTIONS) ×2 IMPLANT
WATER STERILE IRR 1000ML POUR (IV SOLUTION) ×3 IMPLANT

## 2021-03-02 NOTE — Progress Notes (Signed)
Entered patient room to assess pain. Patient states "I am not feeling your vibe. If I have to wait for the next shift to get pain medicine then I will." Explained to patient that this nurse has not withheld appropriate pain management from him only educated on the process. Offered to patient to have another nurse to come in and take over care for him for the remainder of the shift and he explains "No, you have me upset and I don't want anyone else in here please get out my room. I'll call my lawyer in the morning."  Charge nurse made aware.

## 2021-03-02 NOTE — Anesthesia Procedure Notes (Signed)
Anesthesia Regional Block: Popliteal block   Pre-Anesthetic Checklist: , timeout performed,  Correct Patient, Correct Site, Correct Laterality,  Correct Procedure, Correct Position, site marked,  Risks and benefits discussed,  Surgical consent,  Pre-op evaluation,  At surgeon's request and post-op pain management  Laterality: Left and Lower  Prep: chloraprep       Needles:  Injection technique: Single-shot      Needle Length: 9cm  Needle Gauge: 22     Additional Needles: Arrow StimuQuik ECHO Echogenic Stimulating PNB Needle  Procedures:,,,, ultrasound used (permanent image in chart),,    Narrative:  Start time: 03/02/2021 8:51 AM End time: 03/02/2021 8:59 AM Injection made incrementally with aspirations every 5 mL.  Performed by: Personally  Anesthesiologist: Val Eagle, MD

## 2021-03-02 NOTE — Progress Notes (Signed)
Pt requesting pain medication by name "Dilaudid." Pain rating 10/10. Dilaudid given. Educated patient that per orders we give pain medication based off pain rating.  Patient begins to talk over this nurse and say "when can I get my next dose of pain medication?" Educated patient that Dilaudid is Q4 PRN and Oxycodone is Q4 PRN per pain scale at that time. Patient states "well, I know my body and I will be in pain. I have been supplementing my pain while not in the hospital." Patient continues to explain that he has been "buying pain pills from a friend because they wont give me any more pain medicine."  This nurse explained again to the patient the ordered pain regimen and the patient becomes aggressive in tone. This nurse also explained that we could contact the physician if pain is not being managed. The patient continues to speak rudely and say "you don't know my body. I need my pain medicine."   Will continue to monitor for effectiveness and report to charge nurse of situation.  All safety precautions in place at this time.

## 2021-03-02 NOTE — H&P (Signed)
Cody [name] is an 50 y.o. male.   Chief Complaint: Left foot pain HPI: 50 year old male with a past medical history significant for diabetes has left foot pain after arthrodesis in July 2020.  He has a painful nonunion and painful hardware.  He has failed nonoperative treatment to date including activity modification, oral anti-inflammatories, prolonged immobilization.  He presents now for surgical treatment.  In the meantime he has become homeless.  He will require postoperative placement in a skilled nursing facility for physical therapy and convalescence.  Past Medical History:  Diagnosis Date   Acid reflux    Diabetes mellitus without complication (HCC)    Type II   Hypertension    Mixed hyperlipidemia    Sleep apnea    No cpap    Past Surgical History:  Procedure Laterality Date   FOOT ARTHRODESIS Left 09/25/2018   Procedure: Left subtalar joint arthrodesis and peroneal tendon repair;  Surgeon: Toni Arthurs, MD;  Location: [facility];  Service: Orthopedics;  Laterality: Left;   KNEE SURGERY      Family History  Problem Relation Age of Onset   Hypertension Other    Social History:  reports that he has quit smoking. He has never used smokeless tobacco. He reports that he does not drink alcohol and does not use drugs.  Allergies: No Known Allergies  Medications Prior to Admission  Medication Sig Dispense Refill   amLODipine (NORVASC) 10 MG tablet Take 10 mg by mouth daily.      atorvastatin (LIPITOR) 40 MG tablet Take 40 mg by mouth daily.      carvedilol (COREG) 6.25 MG tablet Take 6.25 mg by mouth 2 (two) times daily with a meal.     chlorthalidone (HYGROTON) 25 MG tablet Take 25 mg by mouth daily.     lisinopril (ZESTRIL) 40 MG tablet Take 40 mg by mouth daily.     magnesium oxide (MAG-OX) 400 MG tablet Take 400 mg by mouth daily.     metFORMIN (GLUCOPHAGE-XR) 500 MG 24 hr tablet Take 1,000 mg by mouth 2 (two) times daily.     pantoprazole (PROTONIX) 40 MG  tablet Take 40 mg by mouth daily.     RYBELSUS 7 MG TABS Take 7 mg by mouth daily.      No results found for this or any previous visit (from the past 48 hour(s)). DG MINI C-ARM IMAGE ONLY  Result Date: 03/02/2021 There is no interpretation for this exam.  This order is for images obtained during a surgical procedure.  Please See "Surgeries" Tab for more information regarding the procedure.    Review of Systems no recent fever, chills, nausea, vomiting or changes in his appetite  Blood pressure (!) 154/103, pulse 89, temperature 98.3 F (36.8 C), temperature source Oral, resp. rate 18, height 6\' 2"  (1.88 m), weight 125.2 kg, SpO2 100 %. Physical Exam  Well-nourished well-developed man in no apparent distress.  Alert and oriented x4.  Normal mood and affect.  Gait is antalgic to the left.  Surgical incision is healed.  No signs of infection.  Tender to palpation at sinus Tarsi.  Pain with inversion and eversion through the hindfoot.  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  The risks and benefits of the alternative treatment options have been discussed in detail.  The patient wishes to proceed with surgery and specifically understands risks of  bleeding, infection, nerve damage, blood clots, need for additional surgery, amputation and death.   Toni Arthurs, MD March 22, 2021, 7:52 AM

## 2021-03-02 NOTE — Op Note (Signed)
03/02/2021  10:43 AM  PATIENT:  Cody [name]  50 y.o. 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  Removal of deep implants oft the left calcaneus   2.  Revision arthrodesis of the left subtalar joint   3.  AP and lateral left ankle xrays   4.  AP, lateral and Harris heel xrays of the left foot  SURGEON:  Toni Arthurs, MD  ASSISTANT: Alfredo Martinez, PA-C  ANESTHESIA:   General, regional  EBL:  minimal   TOURNIQUET:   Total Tourniquet Time Documented: Thigh (Left) - 70 minutes Total: Thigh (Left) - 70 minutes  COMPLICATIONS:  None apparent  DISPOSITION:  Extubated, awake and stable to recovery.  INDICATION FOR PROCEDURE: The patient is a 50 year old male with a past medical history significant for diabetes.  He is almost 2 years status post left subtalar joint arthrodesis after a work injury.  He went on to a painful nonunion and has painful hardware and his calcaneus.  He has failed nonoperative treatment to date and presents now for removal of hardware and revision subtalar arthrodesis.  The risks and benefits of the alternative treatment options have been discussed in detail.  The patient wishes to proceed with surgery and specifically understands risks of bleeding, infection, nerve damage, blood clots, need for additional surgery, amputation and death.   PROCEDURE IN DETAIL: After preoperative consent was obtained and the correct operative site was identified, the patient was brought the operating room and placed upon the operating table.  General anesthesia was induced.  Preoperative antibiotics were administered.  Surgical timeout was taken.  The left lower extremity was prepped and draped in standard sterile fashion with a tourniquet around the thigh.  The extremity was elevated and the tourniquet was inflated to 300 mmHg.  The patient's previous heel incision was identified.  It was opened  again sharply and dissection carried down through the subcutaneous tissues.  The medial screw head was identified.  A guidepin was inserted.  The screw was removed without difficulty.  The lateral screw was then identified in the same fashion and removed without difficulty.  Attention was turned to the lateral aspect of the hindfoot where the previous incision was identified.  Was opened again sharply and dissection carried down through the subcutaneous tissues.  Branches of the sural nerve were protected.  The interval between the peroneals and the extensor digitorum brevis was developed.  The subtalar joint was open.  There was gross motion noted with passive inversion and eversion of the hindfoot.  The joint was opened with a curette and lamina spreader.  The scar tissue was removed with curettes and a rondure.  Sclerotic bone was removed with curettes.  The wound was irrigated copiously.  Both joint surfaces were then perforated with a drill bit leaving the resultant bone graft in place.  A curved osteotome was then used to break up the sclerotic bone further.  Augment Rh PDGF combined with tricalcium phosphate was placed in the joint.  The joint was reduced and the wound approximated with 0 Vicryl to contain the graft material.  A guidepin for the 8 mm [employer] cannulated screws was inserted from the calcaneus across to the talar dome.  Ankle and foot films showed appropriate position of the guidepin.  A second guidepin was then placed across the subtalar joint from the distal lateral calcaneus to the talar neck.  Foot and ankle radiographs confirmed  appropriate position of both guidepins.  The guidepin in the heel was overdrilled and an 8 mm partially-threaded screw inserted.  It was noted of excellent purchase and compressed the posterior facet appropriately.  The distal pin was overdrilled and another 8 mm partially-threaded screw inserted.  It was also noted to compress the arthrodesis site  appropriately.  Final AP and lateral radiographs of the ankle as well as AP, lateral and Harris heel radiographs of the foot show appropriate reduction of the subtalar joint and appropriate position of all hardware.  The wounds were irrigated copiously.  The plantar foot wounds were closed with nylon.  The lateral hindfoot wound was sprinkled with vancomycin powder and closed with nylon.  Sterile dressings were applied followed by well-padded short leg splint.  The tourniquet was released after application of the dressings.  The patient was awakened from anesthesia and transported to the recovery room in stable condition.   FOLLOW UP PLAN: The patient will be admitted to the inpatient service pending discharge to a skilled nursing facility for physical therapy, Occupational Therapy and convalescence.  Lovenox for DVT prophylaxis.  Hospitalist consultation for management of his medical comorbidities.   RADIOGRAPHS: AP and lateral radiographs of the left ankle are obtained intraoperatively.  These show interval arthrodesis of the subtalar joint with appropriate screw position and the lateral talar dome.  AP, lateral and Harris heel radiographs of the left foot are obtained intraoperatively.  These show interval arthrodesis of the subtalar joint with appropriate position of the lag screws across the posterior facet of the subtalar joint and the anterior talocalcaneal interval.    Alfredo Martinez PA-C was present and scrubbed for the duration of the operative case. His assistance was essential in positioning the patient, prepping and draping, gaining and maintaining exposure, performing the operation, closing and dressing the wounds and applying the splint.

## 2021-03-02 NOTE — Discharge Instructions (Addendum)
Toni Arthurs, MD [facility]  Please read the following information regarding your care after surgery.  Medications  You only need a prescription for the narcotic pain medicine (ex. oxycodone, Percocet, Norco).  All of the other medicines listed below are available over the counter. X Aleve 2 pills twice a day for the first 3 days after surgery. X acetominophen (Tylenol) 650 mg every 4-6 hours as you need for minor to moderate pain X oxycodone as prescribed for severe pain  Narcotic pain medicine (ex. oxycodone, Percocet, Vicodin) will cause constipation.  To prevent this problem, take the following medicines while you are taking any pain medicine. X docusate sodium (Colace) 100 mg twice a day X senna (Senokot) 2 tablets twice a day  X To help prevent blood clots, take Xarelto as prescribed for two weeks after surgery.  You should also get up every hour while you are awake to move around.    Weight Bearing X Do not bear any weight on the operated leg or foot.  Cast / Splint / Dressing X Keep your splint, cast or dressing clean and dry.  Don't put anything (coat hanger, pencil, etc) down inside of it.  If it gets damp, use a hair dryer on the cool setting to dry it.  If it gets soaked, call the office to schedule an appointment for a cast change.   After your dressing, cast or splint is removed; you may shower, but do not soak or scrub the wound.  Allow the water to run over it, and then gently pat it dry.  Swelling It is normal for you to have swelling where you had surgery.  To reduce swelling and pain, keep your toes above your nose for at least 3 days after surgery.  It may be necessary to keep your foot or leg elevated for several weeks.  If it hurts, it should be elevated.  Follow Up Call my office at [phone] when you are discharged from the hospital or surgery center to schedule an appointment to be seen six weeks after surgery.  Call my office at [phone] if you develop  a fever >101.5 F, nausea, vomiting, bleeding from the surgical site or severe pain.

## 2021-03-02 NOTE — Anesthesia Procedure Notes (Signed)
Procedure Name: Intubation Date/Time: 03/02/2021 9:30 AM Performed by: Macie Burows, CRNA Pre-anesthesia Checklist: Patient identified, Emergency Drugs available, Suction available and Patient being monitored Patient Re-evaluated:Patient Re-evaluated prior to induction Oxygen Delivery Method: Circle system utilized Preoxygenation: Pre-oxygenation with 100% oxygen Induction Type: IV induction Ventilation: Two handed mask ventilation required Laryngoscope Size: Miller and 3 Grade View: Grade II Tube type: Oral Tube size: 7.5 mm Number of attempts: 1 Airway Equipment and Method: Stylet and Oral airway Placement Confirmation: ETT inserted through vocal cords under direct vision, positive ETCO2 and breath sounds checked- equal and bilateral Secured at: 24 cm Tube secured with: Tape Dental Injury: Teeth and Oropharynx as per pre-operative assessment

## 2021-03-02 NOTE — Transfer of Care (Signed)
Immediate Anesthesia Transfer of Care Note  Patient: Cody [name]  Procedure(s) Performed: Revision left subtalar arthrodesis (Left: Ankle)  Patient Location: PACU  Anesthesia Type:General  Level of Consciousness: awake and alert   Airway & Oxygen Therapy: Patient Spontanous Breathing  Post-op Assessment: Report given to RN and Post -op Vital signs reviewed and stable  Post vital signs: Reviewed and stable  Last Vitals:  Vitals Value Taken Time  BP 159/112 03/02/21 1051  Temp    Pulse 89 03/02/21 1052  Resp 16 03/02/21 1053  SpO2 99 % 03/02/21 1052  Vitals shown include unvalidated device data.  Last Pain:  Vitals:   03/02/21 0900  TempSrc:   PainSc: 4       Patients Stated Pain Goal: 4 (03/02/21 0800)  Complications: No notable events documented.

## 2021-03-02 NOTE — Anesthesia Postprocedure Evaluation (Signed)
Anesthesia Post Note  Patient: Cody [name]  Procedure(s) Performed: Revision left subtalar arthrodesis (Left: Ankle)     Patient location during evaluation: PACU Anesthesia Type: Regional and General Level of consciousness: awake and alert Pain management: pain level controlled Vital Signs Assessment: post-procedure vital signs reviewed and stable Respiratory status: spontaneous breathing, nonlabored ventilation, respiratory function stable and patient connected to nasal cannula oxygen Cardiovascular status: blood pressure returned to baseline and stable Postop Assessment: no apparent nausea or vomiting Anesthetic complications: no   No notable events documented.  Last Vitals:  Vitals:   03/02/21 1205 03/02/21 1307  BP: (!) 129/96 (!) 155/115  Pulse: 71 68  Resp: 11 16  Temp: 36.7 C 36.5 C  SpO2: 100% 100%    Last Pain:  Vitals:   03/02/21 1307  TempSrc: Oral  PainSc:                  Kyli Sorter

## 2021-03-02 NOTE — Plan of Care (Signed)

## 2021-03-02 NOTE — Progress Notes (Signed)
Patient did not arrive at 0530 as scheduled.  Patient states that he can be here by approximately 645.  Dr. Victorino Dike and OR desk aware.

## 2021-03-03 ENCOUNTER — Encounter (HOSPITAL_COMMUNITY): Payer: Self-pay | Admitting: Orthopedic Surgery

## 2021-03-03 LAB — GLUCOSE, CAPILLARY
Glucose-Capillary: 148 mg/dL — ABNORMAL HIGH (ref 70–99)
Glucose-Capillary: 118 mg/dL — ABNORMAL HIGH (ref 70–99)
Glucose-Capillary: 99 mg/dL (ref 70–99)
Glucose-Capillary: 114 mg/dL — ABNORMAL HIGH (ref 70–99)

## 2021-03-03 MED ORDER — OXYCODONE HCL 5 MG PO TABS: 5.0000 mg | ORAL_TABLET | ORAL | 0 refills | Status: AC | PRN

## 2021-03-03 MED ORDER — RIVAROXABAN 10 MG PO TABS: 10.0000 mg | ORAL_TABLET | Freq: Every day | ORAL | 0 refills | Status: DC

## 2021-03-03 MED ORDER — DOCUSATE SODIUM 100 MG PO CAPS: 100.0000 mg | ORAL_CAPSULE | Freq: Two times a day (BID) | ORAL | 0 refills | Status: DC

## 2021-03-03 MED ORDER — SENNA 8.6 MG PO TABS: 2.0000 | ORAL_TABLET | Freq: Two times a day (BID) | ORAL | 0 refills | Status: DC

## 2021-03-03 NOTE — TOC Initial Note (Signed)
Transition of Care [facility]) - Initial/Assessment Note    Patient Details  Name: Cody [name] MRN: [id number] Date of Birth: [birth year]  [facility] of [facility]) CM/SW Contact:    Lorri Frederick, LCSW Phone Number: 03/03/2021, 3:36 PM  Clinical Narrative:   CSW messaged by Alfredo Martinez PA that pt will require 6 weeks of SNF post op.  Pt is homeless, this is second surgery, pt was DC to homeless shelter previously and this did not work for follow up.    Contact information obtained today from various sources: Al's: this may be a shelter where pt stayed in [place]? Casimiro Needle, therapist, [phone].   [address] [employer], [phone].  Crystal, paralegal.  They are representing pt in his worker's comp case. London Sheer, atty, cell: [phone]. Benetta Spar, RNCM, [employer].  Cell: [phone].  [email or web address] Eliberto Ivory, worker's comp adjustor.  No phone.  [email or web address]  Worker's comp claim # [id number].  CSW spoke with pt regarding recommendation for SNF.  Pt agreeable, choice document given.  Pt declines to provide permission to speak to any family members. "I'm on my own."  Permission given to speak with those involved in his case.  Permission given to send out referral for SNF in [place].  Pt is vaccinated for covid but not boosted.  CSW informed pt I am not sure which SNF will be willing to accept worker's comp, pt understands, does not want a poor facility.  Pt reports he stayed at a shelter after his last surgery and ended up needing this second surgery.  Pt reports most recently he has been staying in his truck.    CSW spoke with Benetta Spar, RNCM.  She is requesting medical necessity documentation be emailed to her, which she will send to Eliberto Ivory, the adjustor.  She is aware the MD is requesting approval for 6 weeks of SNF and emphasized that there must be a medical need for this.  Sole reason cannot be homelessness.    CSW noted that  there is no PT/OT notes/orders at this time.  Messaged with Alfredo Martinez regarding medical necessity documentation, need for OT/PT and he will order.  TOC will continue to follow.                 Expected Discharge Plan: Skilled Nursing Facility Barriers to Discharge: Continued Medical Work up, SNF Pending bed offer, Other (must enter comment) (Worker's comp payer)   Patient Goals and CMS Choice Patient states their goals for this hospitalization and ongoing recovery are:: 100% CMS Medicare.gov Compare Post Acute Care list provided to:: Patient Choice offered to / list presented to : Patient  Expected Discharge Plan and Services Expected Discharge Plan: Skilled Nursing Facility In-house Referral: Clinical Social Work Discharge Planning Services: CM Consult Post Acute Care Choice: Skilled Nursing Facility Living arrangements for the past 2 months: Homeless                                      Prior Living Arrangements/Services Living arrangements for the past 2 months: Homeless Lives with:: Self Patient language and need for interpreter reviewed:: Yes Do you feel safe going back to the place where you live?: No   Pt has been homeless, staying in his truck  Need for Family Participation in Patient Care: Yes (Comment) Care giver support system in place?: No (comment) Current home services: Other (comment) (none) Criminal Activity/Legal Involvement Pertinent to  Current Situation/Hospitalization: No - Comment as needed  Activities of Daily Living Home Assistive Devices/Equipment: Eyeglasses, Cane (specify quad or straight) ADL Screening (condition at time of admission) Patient's cognitive ability adequate to safely complete daily activities?: Yes Is the patient deaf or have difficulty hearing?: No Does the patient have difficulty seeing, even when wearing glasses/contacts?: No Does the patient have difficulty concentrating, remembering, or making decisions?: No Patient  able to express need for assistance with ADLs?: Yes Does the patient have difficulty dressing or bathing?: No Independently performs ADLs?: Yes (appropriate for developmental age) Does the patient have difficulty walking or climbing stairs?: Yes Weakness of Legs: Both Weakness of Arms/Hands: None  Permission Sought/Granted Permission sought to share information with : Magazine features editor (pt declined to provide consent for any family contact.)       Permission granted to share info w AGENCY: SNF        Emotional Assessment Appearance:: Appears stated age Attitude/Demeanor/Rapport: Engaged Affect (typically observed): Appropriate, Pleasant Orientation: : Oriented to Self, Oriented to Place, Oriented to  Time, Oriented to Situation Alcohol / Substance Use: Not Applicable Psych Involvement: No (comment)  Admission diagnosis:  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] Patient Active Problem List   Diagnosis Date Noted   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 - [place], [place] - [address] [place] [address] Phone: [phone] Fax: [phone]  [facility] - [place], [place] - [address] [place] [address] Phone: [phone] Fax: [phone]     Social Determinants of Health  (SDOH) Interventions    Readmission Risk Interventions No flowsheet data found.

## 2021-03-03 NOTE — Progress Notes (Addendum)
Subjective: 1 Day Post-Op Procedure(s) (LRB): Revision left subtalar arthrodesis (Left)  Patient reports pain as moderate to severe.  Reports that he is ready for breakfast.  Admits to flatus.  Denies fever, chills, N/V, CP, SOB.  Objective:   VITALS:  Temp:  [97.5 F (36.4 C)-98.2 F (36.8 C)] 98.2 F (36.8 C) (12/09 0646) Pulse Rate:  [68-95] 78 (12/09 0646) Resp:  [11-23] 18 (12/09 0646) BP: (129-185)/(96-129) 185/106 (12/09 0646) SpO2:  [98 %-100 %] 100 % (12/09 0646)  General: WDWN patient in NAD. Psych:  Appropriate mood and affect. Neuro:  A&O x 3, Moving all extremities, sensation intact to light touch HEENT:  EOMs intact Chest:  Even non-labored respirations Skin:  SLS C/D/I, no rashes or lesions Extremities: warm/dry, no visible edema, erythema or echymosis.  No lymphadenopathy. Pulses: Popliteus 2+ MSK:  ROM: EHL/FHL intact, MMT: able to perform quad set   LABS Recent Labs    03/02/21 1432  HGB 12.5*  WBC 6.8  PLT 77*   Recent Labs    03/02/21 1432  CREATININE 1.35*   No results for input(s): LABPT, INR in the last 72 hours.   Assessment/Plan: 1 Day Post-Op Procedure(s) (LRB): Revision left subtalar arthrodesis (Left)  NWB L LE Up with therapy Patient will require 6 weeks stay at SNF upon D/C due to social circumstances and his NWB status.  DVT ppx:  Lovenox in house; transition to Xarelto upon D/C D/C scripts on chart.  After searching [place] PMP Aware the patient is provided a Rx for oxycodone. Appreciate Hospitalist's assistance with this patient. Plan for 2 week outpatient post-op visit.  Alfredo Martinez PA-C [facility] Office:  [phone]   Addendum:  Patient is not capable of safely maintaining a NWB on his own.  WB on his splint can result in repeat nonunion of the arthrodesis site, and need for additional surgery.  Patient should maintain a strict NWB status for the first 6 weeks.

## 2021-03-04 LAB — GLUCOSE, CAPILLARY
Glucose-Capillary: 103 mg/dL — ABNORMAL HIGH (ref 70–99)
Glucose-Capillary: 121 mg/dL — ABNORMAL HIGH (ref 70–99)
Glucose-Capillary: 121 mg/dL — ABNORMAL HIGH (ref 70–99)
Glucose-Capillary: 89 mg/dL (ref 70–99)
Glucose-Capillary: 93 mg/dL (ref 70–99)

## 2021-03-04 NOTE — Evaluation (Signed)
Occupational Therapy Evaluation Patient Details Name: Cody [name] MRN: [id number] DOB: 10-Oct-1970 Today's Date: 03/04/2021   History of Present Illness Pt. 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  PMH: DM, HTN   Clinical Impression   Patient admitted for the diagnosis and procedure above.  Unfortunately he is currently homeless, and is unsure regarding his next steps.  SNF has been recommended for post acute rehab prior to returning to ? Homeless shelter.  OT to follow in the acute setting to maximize function.  Deficits impacting independence are listed below.       Recommendations for follow up therapy are one component of a multi-disciplinary discharge planning process, led by the attending physician.  Recommendations may be updated based on patient status, additional functional criteria and insurance authorization.   Follow Up Recommendations  Skilled nursing-short term rehab (<3 hours/day)    Assistance Recommended at Discharge Intermittent Supervision/Assistance  Functional Status Assessment  Patient has had a recent decline in their functional status and demonstrates the ability to make significant improvements in function in a reasonable and predictable amount of time.  Equipment Recommendations  None recommended by OT    Recommendations for Other Services       Precautions / Restrictions Precautions Precautions: Fall Restrictions Weight Bearing Restrictions: Yes LLE Weight Bearing: Non weight bearing      Mobility Bed Mobility Overal bed mobility: Modified Independent                  Transfers Overall transfer level: Needs assistance Equipment used: Rolling walker (2 wheels) Transfers: Sit to/from Stand;Bed to chair/wheelchair/BSC Sit to Stand: Supervision     Step pivot transfers: Supervision      General transfer comment: Demos good safety awareness with maintaining NWB precautions and use of UEs to push up from bed.      Balance Overall balance assessment: No apparent balance deficits (not formally assessed)                                         ADL either performed or assessed with clinical judgement   ADL Overall ADL's : Needs assistance/impaired Eating/Feeding: Independent;Bed level   Grooming: Wash/dry hands;Wash/dry face;Set up;Sitting   Upper Body Bathing: Set up;Sitting   Lower Body Bathing: Minimal assistance;Sitting/lateral leans   Upper Body Dressing : Set up;Sitting   Lower Body Dressing: Minimal assistance;Sitting/lateral leans   Toilet Transfer: Min guard;Ambulation;Regular Toilet                   Vision Patient Visual Report: No change from baseline       Perception Perception Perception: Within Functional Limits   Praxis Praxis Praxis: Intact    Pertinent Vitals/Pain Pain Assessment: Faces Pain Score: 8  Faces Pain Scale: Hurts even more Pain Location: L lower leg/ankle Pain Descriptors / Indicators: Throbbing Pain Intervention(s): Monitored during session     Hand Dominance     Extremity/Trunk Assessment Upper Extremity Assessment Upper Extremity Assessment: Overall WFL for tasks assessed   Lower Extremity Assessment Lower Extremity Assessment: Defer to PT evaluation    Cervical / Trunk Assessment Cervical / Trunk Assessment: Normal   Communication Communication Communication: No difficulties   Cognition Arousal/Alertness: Awake/alert Behavior During Therapy: WFL for tasks assessed/performed Overall  Cognitive Status: Within Functional Limits for tasks assessed                                      General Comments   VSS    Exercises     Shoulder Instructions      Home Living Family/patient expects to be discharged to:: Shelter/Homeless Living Arrangements: Alone                                       Prior Functioning/Environment Prior Level of Function : Independent/Modified Independent             Mobility Comments: Has a Quad cane for mobility ADLs Comments: Currenlty living in his vehicle.        OT Problem List: Decreased activity tolerance;Pain      OT Treatment/Interventions: Self-care/ADL training;Therapeutic activities;Therapeutic exercise;Patient/family education    OT Goals(Current goals can be found in the care plan section) Acute Rehab OT Goals Patient Stated Goal: Unsure OT Goal Formulation: With patient Time For Goal Achievement: 03/17/21 Potential to Achieve Goals: Good ADL Goals Pt Will Perform Grooming: with set-up;sitting;standing Pt Will Perform Lower Body Bathing: with supervision;sit to/from stand Pt Will Perform Lower Body Dressing: with supervision;sit to/from stand Pt Will Transfer to Toilet: with modified independence;regular height toilet;ambulating Pt Will Perform Toileting - Clothing Manipulation and hygiene: with modified independence;sit to/from stand Pt/caregiver will Perform Home Exercise Program: Increased strength;Both right and left upper extremity;With theraband;With written HEP provided  OT Frequency: Min 2X/week   Barriers to D/C: Decreased caregiver support          Co-evaluation              AM-PAC OT "6 Clicks" Daily Activity     Outcome Measure Help from another person eating meals?: None Help from another person taking care of personal grooming?: None Help from another person toileting, which includes using toliet, bedpan, or urinal?: A Little Help from another person bathing (including washing, rinsing, drying)?: A Little Help from another person to put on and taking off regular upper body clothing?: None Help from another person to put on and taking off regular lower body clothing?: A Little 6 Click Score: 21   End of Session Equipment Utilized During Treatment:  Rolling walker (2 wheels)  Activity Tolerance: Patient tolerated treatment well Patient left: in bed;with call bell/phone within reach;with nursing/sitter in room  OT Visit Diagnosis: Pain Pain - Right/Left: Left Pain - part of body: Ankle and joints of foot                Time: 4403-4742 OT Time Calculation (min): 17 min Charges:  OT General Charges $OT Visit: 1 Visit OT Evaluation $OT Eval Moderate Complexity: 1 Mod  03/04/2021  RP, OTR/L  Acute Rehabilitation Services  Office:  [phone]   Suzanna Obey 03/04/2021, 1:12 PM

## 2021-03-04 NOTE — NC FL2 (Signed)
[place] MEDICAID FL2 LEVEL OF CARE SCREENING TOOL     IDENTIFICATION  Patient Name: Cody [name] Birthdate: Feb 07, 1971 Sex: male Admission Date (Current Location): 03/02/2021  [facility] and [place] Number:  Producer, television/film/video and Address:  [facility] [place], 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   Cellulitis 11/25/2019   Cellulitis of right lower leg 08/28/2019   Uncontrolled type 2 diabetes mellitus with hyperglycemia (HCC) 08/28/2019   Cellulitis of left foot 08/28/2019   New onset type 2 diabetes mellitus (HCC) 12/06/2015   Essential hypertension 12/05/2015   GERD (gastroesophageal reflux disease) 12/05/2015   Cellulitis of leg, right 12/04/2015   AKI (acute kidney injury) (HCC) 12/04/2015   Thrombocytopenia (HCC) 12/04/2015    Orientation RESPIRATION BLADDER Height & Weight     Self, Time, Situation, Place  Normal Continent Weight: 276 lb 0.3 oz (125.2 kg) Height:  6\' 2"  (188 cm)  BEHAVIORAL SYMPTOMS/MOOD NEUROLOGICAL BOWEL NUTRITION STATUS      Continent Diet  AMBULATORY STATUS COMMUNICATION OF NEEDS Skin   Limited Assist Verbally Normal                       Personal Care Assistance Level of Assistance  Bathing, Feeding, Dressing Bathing Assistance: Limited assistance Feeding assistance: Independent Dressing Assistance: Limited assistance     Functional Limitations Info  Sight, Hearing, Speech Sight Info: Adequate Hearing Info: Adequate Speech Info: Adequate    SPECIAL CARE FACTORS FREQUENCY  PT  (By licensed PT), OT (By licensed OT)     PT Frequency: 5x per week OT Frequency: 5x per week            Contractures Contractures Info: Not present    Additional Factors Info  Code Status Code Status Info: full             Current Medications (03/04/2021):  This is the current hospital active medication list Current Facility-Administered Medications  Medication Dose Route Frequency Provider Last Rate Last Admin   0.9 %  sodium chloride infusion   Intravenous Continuous 14/12/2020, PA-C 75 mL/hr at 03/04/21 14/10/22 New Bag at 03/04/21 0837   acetaminophen (TYLENOL) tablet 325-650 mg  325-650 mg Oral Q6H PRN 14/10/22, PA-C   650 mg at 03/02/21 1504   amLODipine (NORVASC) tablet 10 mg  10 mg Oral Daily 14/08/22, PA-C   10 mg at 03/04/21 14/10/22   atorvastatin (LIPITOR) tablet 40 mg  40 mg Oral Daily 2440, PA-C   40 mg at 03/04/21 14/10/22   carvedilol (COREG) tablet 6.25 mg  6.25 mg Oral BID WC 1027, PA-C   6.25 mg at 03/04/21 14/10/22   chlorthalidone (HYGROTON) tablet 25 mg  25 mg Oral Daily 2536, PA-C   25 mg at 03/04/21 14/10/22   diphenhydrAMINE (BENADRYL) 12.5 MG/5ML elixir 12.5-25 mg  12.5-25 mg Oral Q4H PRN 04-25-1984, PA-C       docusate sodium (COLACE) capsule 100 mg  100 mg Oral BID Jacinta Shoe, [place]   100 mg at 03/04/21 2992   enoxaparin (LOVENOX) injection 40 mg  40 mg Subcutaneous Q24H Jacinta Shoe, [place]   40 mg at 03/04/21 4268   HYDROmorphone (DILAUDID) injection 0.5-1 mg  0.5-1 mg Intravenous Q4H PRN Jacinta Shoe, PA-C   1 mg at 03/04/21 3419   insulin aspart (novoLOG) injection 0-20 Units  0-20 Units Subcutaneous TID WC Jacinta Shoe, PA-C   3 Units at 03/02/21 1724   lisinopril (ZESTRIL) tablet 40 mg  40 mg Oral Daily Jacinta Shoe, PA-C   40 mg at 03/04/21 6222   magnesium oxide (MAG-OX) tablet 400 mg  400 mg Oral Daily Leander Rams, RPH   400 mg at 03/04/21 9798    metFORMIN (GLUCOPHAGE-XR) 24 hr tablet 1,000 mg  1,000 mg Oral BID WC Jacinta Shoe, PA-C   1,000 mg at 03/04/21 9211   methocarbamol (ROBAXIN) tablet 500 mg  500 mg Oral Q6H PRN Jacinta Shoe, PA-C   500 mg at 03/04/21 0501   Or   methocarbamol (ROBAXIN) 500 mg in dextrose 5 % 50 mL IVPB  500 mg Intravenous Q6H PRN Jacinta Shoe, PA-C       ondansetron [facility]) tablet 4 mg  4 mg Oral Q6H PRN Jacinta Shoe, PA-C       Or   ondansetron [facility]) injection 4 mg  4 mg Intravenous Q6H PRN Jacinta Shoe, PA-C       oxyCODONE (Oxy IR/ROXICODONE) immediate release tablet 10-15 mg  10-15 mg Oral Q4H PRN Jacinta Shoe, PA-C   15 mg at 03/03/21 1623   oxyCODONE (Oxy IR/ROXICODONE) immediate release tablet 5-10 mg  5-10 mg Oral Q4H PRN Jacinta Shoe, PA-C       pantoprazole (PROTONIX) EC tablet 40 mg  40 mg Oral Daily Jacinta Shoe, PA-C   40 mg at 03/04/21 9417   Semaglutide TABS 7 mg  7 mg Oral Daily Jacinta Shoe, PA-C       senna [facility]) tablet 8.6 mg  1 tablet Oral BID Jacinta Shoe, PA-C   8.6 mg at 03/04/21 4081     Discharge Medications: Please see discharge summary for a list of discharge medications.  Relevant Imaging Results:  Relevant Lab Results:   Additional Information SSN [id number]  Verna Czech [place], [place]

## 2021-03-04 NOTE — Progress Notes (Signed)
   Subjective: 2 Days Post-Op Procedure(s) (LRB): Revision left subtalar arthrodesis (Left)  Pt c/o moderate pain/soreness this morning in the left foot/ankle Will require SNF placement next week Denies any new symptoms or issues No numbness or tingling distally Patient reports pain as moderate.  Objective:   VITALS:   Vitals:   03/03/21 2000 03/04/21 0436  BP: (!) 137/100 (!) 140/96  Pulse: 78 76  Resp: 18 18  Temp: 98.4 F (36.9 C) 98.6 F (37 C)  SpO2: 100% 100%    Left foot/ankle: splint in place Nv intact distally No signs of drainage or edema Currently non weight bearing  LABS Recent Labs    03/02/21 1432  HGB 12.5*  HCT 36.9*  WBC 6.8  PLT 77*    Recent Labs    03/02/21 1432  CREATININE 1.35*     Assessment/Plan: 2 Days Post-Op Procedure(s) (LRB): Revision left subtalar arthrodesis (Left) Pain management PT/OT as able, strict non weight bearing left lower extremity D/c planning to SNF next week     Alphonsa Overall PA-C, MPAS [facility] is now [facility] [address], [place], [place] [address] Phone: [phone] [email or web address] Facebook  [employer]

## 2021-03-04 NOTE — Evaluation (Signed)
Physical Therapy Evaluation Patient Details Name: Cody [name] MRN: [id number] DOB: [birth year] Today's Date: 03/04/2021  History of Present Illness  Pt. 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  PMH: DM, HTN  Clinical Impression  Pt is homeless and living in truck, previously mod I with use of SBQC.  After having arthodesis revision, pt is mobilizing short distances with supervision and use of RW.  Despite AMPAC indicating return home based on current level of function, pt would benefit from short term stay in rehab due to living situation and need to maintain strict NWB x 6 weeks to avoid repeat surgery.  Pt demos inc pain limiting activity tolerance and would benefit from additional PT in acute care to inc gait distance and educate on HEP.     Recommendations for follow up therapy are one component of a multi-disciplinary discharge planning process, led by the attending physician.  Recommendations may be updated based on patient status, additional functional criteria and insurance authorization.  Follow Up Recommendations Skilled nursing-short term rehab (<3 hours/day) (Due to being homeless and without support with strict NWB)    Assistance Recommended at Discharge Set up Supervision/Assistance  Functional Status Assessment Patient has had a recent decline in their functional status and demonstrates the ability to make significant improvements in function in a reasonable and predictable amount of time.  Equipment Recommendations  Rolling walker (2 wheels)    Recommendations for Other Services       Precautions / Restrictions Restrictions Weight Bearing Restrictions: Yes LLE Weight Bearing: Non weight bearing      Mobility  Bed Mobility Overal bed mobility: Modified Independent               Patient Response:  Cooperative  Transfers Overall transfer level: Modified independent Equipment used: Rolling walker (2 wheels)               General transfer comment: Demos good safety awareness with maintaining NWB precautions and use of UEs to push up from bed.    Ambulation/Gait Ambulation/Gait assistance: Supervision Gait Distance (Feet): 30 Feet Assistive device: Rolling walker (2 wheels)         General Gait Details: Pt. utilizes 3 pt gait to amb around room with supervision only.  Demos good balance with activity with assist for managing IV pole.  Gait distance limited secondary to throbbing in L LE with upright positioning.  Stairs            Wheelchair Mobility    Modified Rankin (Stroke Patients Only)       Balance Overall balance assessment: Modified Independent                                           Pertinent Vitals/Pain Pain Assessment: 0-10 Pain Score: 8  Pain Location: L lower leg/ankle Pain Descriptors / Indicators: Aching;Burning;Pounding Pain Intervention(s): Limited activity within patient's tolerance;Monitored during session;Repositioned    Home Living Family/patient expects to be discharged to:: Shelter/Homeless (Has been living in truck) Living Arrangements: Alone                      Prior Function Prior Level of Function : Independent/Modified Independent  Mobility Comments: Uses SBQC       Hand Dominance        Extremity/Trunk Assessment   Upper Extremity Assessment Upper Extremity Assessment: Defer to OT evaluation    Lower Extremity Assessment Lower Extremity Assessment: LLE deficits/detail LLE Deficits / Details: Grossly 3-/5, reluctant to bed knee - states it increases pressure in lower leg LLE: Unable to fully assess due to pain    Cervical / Trunk Assessment Cervical / Trunk Assessment: Normal  Communication   Communication: No difficulties  Cognition Arousal/Alertness:  Awake/alert Behavior During Therapy: WFL for tasks assessed/performed Overall Cognitive Status: Within Functional Limits for tasks assessed                                 General Comments: Pt. is supine in bed when PT arrives.  Alert and oriented x 3.  Agreeable to work with PT.  Got pain meds recently.        General Comments      Exercises     Assessment/Plan    PT Assessment Patient needs continued PT services (F/u x 1 to educate on therex)  PT Problem List Decreased range of motion;Decreased activity tolerance;Pain;Decreased strength       PT Treatment Interventions Gait training;Therapeutic exercise;Balance training;DME instruction;Functional mobility training;Therapeutic activities;Patient/family education    PT Goals (Current goals can be found in the Care Plan section)  Acute Rehab PT Goals Patient Stated Goal: Pt's goal is to return to independence PT Goal Formulation: With patient Time For Goal Achievement: 03/18/21 Potential to Achieve Goals: Good    Frequency Min 3X/week   Barriers to discharge Inaccessible home environment;Decreased caregiver support (Homeless, living in truck)      Co-evaluation               AM-PAC PT "6 Clicks" Mobility  Outcome Measure Help needed turning from your back to your side while in a flat bed without using bedrails?: None Help needed moving from lying on your back to sitting on the side of a flat bed without using bedrails?: None Help needed moving to and from a bed to a chair (including a wheelchair)?: A Little Help needed standing up from a chair using your arms (e.g., wheelchair or bedside chair)?: None Help needed to walk in hospital room?: A Little Help needed climbing 3-5 steps with a railing? : A Little 6 Click Score: 21    End of Session Equipment Utilized During Treatment: Gait belt Activity Tolerance: Patient tolerated treatment well Patient left: in chair;with call bell/phone within  reach;with chair alarm set   PT Visit Diagnosis: Other abnormalities of gait and mobility (R26.89)    Time: 2595-6387 PT Time Calculation (min) (ACUTE ONLY): 23 min   Charges:     PT Treatments $Gait Training: 8-22 mins        Elizebeth Kluesner A. Shadaya Marschner, PT, DPT Acute Rehabilitation Services Office: [phone]   Alvino Lechuga A Benna Arno 03/04/2021, 10:00 AM

## 2021-03-05 LAB — GLUCOSE, CAPILLARY
Glucose-Capillary: 98 mg/dL (ref 70–99)
Glucose-Capillary: 116 mg/dL — ABNORMAL HIGH (ref 70–99)
Glucose-Capillary: 107 mg/dL — ABNORMAL HIGH (ref 70–99)
Glucose-Capillary: 96 mg/dL (ref 70–99)

## 2021-03-05 NOTE — Progress Notes (Signed)
   Subjective: 3 Days Post-Op Procedure(s) (LRB): Revision left subtalar arthrodesis (Left)  Pt c/o mild to moderate pain/soreness this morning in the left foot/ankle Will require SNF placement next week Denies any new symptoms or issues No numbness or tingling distally Patient reports pain as moderate.  Objective:   VITALS:   Vitals:   03/05/21 0748 03/05/21 0749  BP: (!) 153/106 (!) 143/102  Pulse: 73 69  Resp: 20   Temp: (!) 97.5 F (36.4 C)   SpO2: 100% 100%    Left foot/ankle: splint in place Nv intact distally No signs of drainage or edema Currently non weight bearing  LABS Recent Labs    03/02/21 1432  HGB 12.5*  HCT 36.9*  WBC 6.8  PLT 77*     Recent Labs    03/02/21 1432  CREATININE 1.35*      Assessment/Plan: 3 Days Post-Op Procedure(s) (LRB): Revision left subtalar arthrodesis (Left) Pain management PT/OT as able, strict non weight bearing left lower extremity D/c planning to SNF next week     Barrie Dunker, PA-C [facility] is now [facility]  [place] [address], [place], [place] [address] Phone: [phone] [email or web address] Facebook  [employer]

## 2021-03-06 LAB — GLUCOSE, CAPILLARY
Glucose-Capillary: 116 mg/dL — ABNORMAL HIGH (ref 70–99)
Glucose-Capillary: 131 mg/dL — ABNORMAL HIGH (ref 70–99)

## 2021-03-06 NOTE — TOC Progression Note (Addendum)
Transition of Care [facility]) - Progression Note    Patient Details  Name: Tyreke Kaeser MRN: [id number] Date of Birth: Jun 23, 1970  [facility]) CM/SW Contact  Lorri Frederick, LCSW Phone Number: 03/06/2021, 1:11 PM  Clinical Narrative:   Clinical information for worker's comp SNF authorization emailed securely to Benetta Spar at [employer].  1435:TC from [place], placement CSW for worker's comp. [id number].  She asked about any bed offers.  She has the clinical information and will be seeking placement for pt as well.  Pt does not have any bed offers so far.     Expected Discharge Plan: Skilled Nursing Facility Barriers to Discharge: Continued Medical Work up, SNF Pending bed offer, Other (must enter comment) (Worker's comp payer)  Expected Discharge Plan and Services Expected Discharge Plan: Skilled Nursing Facility In-house Referral: Clinical Social Work Discharge Planning Services: CM Consult Post Acute Care Choice: Skilled Nursing Facility Living arrangements for the past 2 months: Homeless                                       Social Determinants of Health (SDOH) Interventions    Readmission Risk Interventions No flowsheet data found.

## 2021-03-06 NOTE — Progress Notes (Addendum)
Pt noted to be angry and agitated at this time. Pulse oximetry reattached due to pt finally asleep, pt inquiring about pain medication. Pt informed that he had 45 additional minutes. Pt then requested to have more ice for ice packs. As this nurse leaves out room to retrieve ice, pt calls this nurse back to open door wider. After doing so pt calls this nurse back a second time and this nurse requests that pt wait for this writer to return. While returning to pt's room, loud noises noted coming from pt's room. Upon entering, pt noted sitting down towards end of bed reaching out to walker and cane knocking several objects to floor. When this nurse inquired about what pt was attempting to do pt did not respond. This nurse cautioned pt about not hitting surgical area to foot. Pt states "I don't even care anymore! ". Pt then states "hell from the way I have been treated in here it doesn't matter anyway!". Pt then begun cursing to self. This writer filled ice packs and had tech replace pillow cases and then assisted pt with repositioning foot back on to his pillows. Pt informed exact time pt would return for pain medication. Upon entering room to administer pain medication IV, pt states "I don't think this is in right, because why does it burn". This writer flushed IV with ease and no s/sx of infiltration, however pt reports that it still burns. This Clinical research associate then attempts to reinsert another IV, and pt notified that if unsucessful he would have to wait on IV team. This nurse then reenters room with IV supplies and pt then states "I'm just going to wait for those IV people, I just want one stick that's it". IV consult placed at this time.

## 2021-03-06 NOTE — Plan of Care (Signed)

## 2021-03-06 NOTE — Progress Notes (Signed)
Pt awake most of the night. Pt less agitated, cooperative with staff. No problems noted throughout the night. Received prn medication as requested-partial effects noted.

## 2021-03-06 NOTE — Progress Notes (Signed)
Mobility Specialist Progress Note   03/06/21 1700  Mobility  Activity Refused mobility   Pt had just received pain med's and it was making them dizzy, attempted to move but it made them feel worse. Will follow up tomorrow during a better time.   Frederico Hamman Mobility Specialist Phone Number [phone]

## 2021-03-06 NOTE — Progress Notes (Signed)
Physical Therapy Discharge Patient Details Name: Cody [name] MRN: [id number] DOB: 1970-06-24 Today's Date: 03/06/2021 Time: 5672-0919 PT Time Calculation (min) (ACUTE ONLY): 24 min  Patient discharged from PT services secondary to goals met and no further PT needs identified.  Please see latest therapy progress note for current level of functioning and progress toward goals.    Progress and discharge plan discussed with patient and/or caregiver: Patient/Caregiver agrees with plan  GP   Genella Bas A. Kalab Camps, PT, DPT Acute Rehabilitation Services Office: [place] 03/06/2021, 9:49 AM

## 2021-03-06 NOTE — Progress Notes (Signed)
Physical Therapy Treatment and D/C Patient Details Name: Cody [name] MRN: [id number] DOB: 22-May-1970 Today's Date: 03/06/2021   History of Present Illness Pt. 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  PMH: DM, HTN    PT Comments    Pt demos improved activity tolerance this treatment session, able to amb increased distance with mod I and use of RW.  Demos good balance and safety throughout treatment.  Pt educated on LE therex and demos good understanding and tolerance to initiation of therapy.  Pt will be transitioned to mobility team at this time as no further skilled PT is required in acute care at this time.  Mobility team will continue to help mobilize pt and inc gait distance as appropriate.  Pt remains in need of SNF placement due to being homeless and NWB.   Will need safe place to stay with inc support in order to maintain precautions during recovery, especially with ADLs, meal prep, etc.  Recommendations for follow up therapy are one component of a multi-disciplinary discharge planning process, led by the attending physician.  Recommendations may be updated based on patient status, additional functional criteria and insurance authorization.  Follow Up Recommendations  Skilled nursing-short term rehab (<3 hours/day)     Assistance Recommended at Discharge Set up [place] Recommendations  Rolling walker (2 wheels)    Recommendations for Other Services       Precautions / Restrictions Restrictions Weight Bearing Restrictions: Yes LLE Weight Bearing: Non weight bearing     Mobility  Bed Mobility Overal bed mobility: Modified Independent             General bed mobility comments: Transfers in and OOB without assist. Patient Response: Cooperative  Transfers Overall transfer level: Modified  independent Equipment used: Rolling walker (2 wheels)   Sit to Stand: Modified independent (Device/Increase time)           General transfer comment: Performs activity safely while maintaining WB restrictions.    Ambulation/Gait Ambulation/Gait assistance: Modified independent (Device/Increase time) Gait Distance (Feet): 100 Feet Assistive device: Rolling walker (2 wheels)         General Gait Details: Amb with 3 pt gait and use of RW multiple laps in room.  PT offers for pt to amb in halls, but pt prefers to stay in room at this time.  Demos good activity tolerance, safety and no LOB with activity.   Stairs             Wheelchair Mobility    Modified Rankin (Stroke Patients Only)       Balance Overall balance assessment: Modified Independent                                          Cognition Arousal/Alertness: Lethargic Behavior During Therapy: WFL for tasks assessed/performed Overall Cognitive Status: Within Functional Limits for tasks assessed                                 General Comments: Pt. is sleeping soundly when PT arrives but arouses easily.  States he did not sleep well last night.  Agreeable to work with PT.  Exercises Total Joint Exercises Heel Slides: AROM;Left;10 reps Hip ABduction/ADduction: AROM;Left;10 reps Straight Leg Raises: AROM;Left;10 reps    General Comments General comments (skin integrity, edema, etc.): Discussed switching pt to mobility team, pt demos understanding      Pertinent Vitals/Pain Pain Assessment: 0-10 Pain Score: 5  Pain Descriptors / Indicators: Aching;Tender;Discomfort Pain Intervention(s): Monitored during session    Home Living                          Prior Function            PT Goals (current goals can now be found in the care plan section) Progress towards PT goals: Goals met/education completed, patient discharged from PT    Frequency            PT Plan Frequency needs to be updated (Pt. to be D/C from caseload, moved to mobility team)    Co-evaluation              AM-PAC PT "6 Clicks" Mobility   Outcome Measure  Help needed turning from your back to your side while in a flat bed without using bedrails?: None Help needed moving from lying on your back to sitting on the side of a flat bed without using bedrails?: None Help needed moving to and from a bed to a chair (including a wheelchair)?: None Help needed standing up from a chair using your arms (e.g., wheelchair or bedside chair)?: None Help needed to walk in hospital room?: None Help needed climbing 3-5 steps with a railing? : A Little 6 Click Score: 23    End of Session Equipment Utilized During Treatment: Gait belt Activity Tolerance: Patient tolerated treatment well Patient left: in bed;with call bell/phone within reach         Time: 2505-3976 PT Time Calculation (min) (ACUTE ONLY): 24 min  Charges:  $Gait Training: 8-22 mins $Therapeutic Exercise: 8-22 mins                     Helina Hullum A. Nikoleta Dady, PT, DPT Acute Rehabilitation Services Office: [place] 03/06/2021, 9:44 AM

## 2021-03-06 NOTE — Progress Notes (Signed)
Subjective: 4 Days Post-Op Procedure(s) (LRB): Revision left subtalar arthrodesis (Left)  Patient reports pain as moderate.  Tolerating POs well.  Admits to BM.  Denies fever, chills, N/V, CP, SOB.  Notes that he worked well with therapy on Sat.  Objective:   VITALS:  Temp:  [97.5 F (36.4 C)-98 F (36.7 C)] 97.5 F (36.4 C) (12/12 0602) Pulse Rate:  [69-88] 88 (12/12 0602) Resp:  [18-20] 18 (12/12 0602) BP: (143-159)/(92-111) 159/111 (12/12 0602) SpO2:  [98 %-100 %] 100 % (12/12 0602)  General: WDWN patient in NAD. Psych:  Appropriate mood and affect. Neuro:  A&O x 3, Moving all extremities, sensation intact to light touch HEENT:  EOMs intact Chest:  Even non-labored respirations Skin:  SLS C/D/I, no rashes or lesions Extremities: warm/dry, no visible edema, erythema or echymosis.  No lymphadenopathy. Pulses: Popliteus 2+ MSK:  ROM: EHL/FHL intact, MMT: able to perform quad set   LABS No results for input(s): HGB, WBC, PLT in the last 72 hours. No results for input(s): NA, K, CL, CO2, BUN, CREATININE, GLUCOSE in the last 72 hours. No results for input(s): LABPT, INR in the last 72 hours.   Assessment/Plan: 4 Days Post-Op Procedure(s) (LRB): Revision left subtalar arthrodesis (Left)  NWB L LE Up with therapy Disp:  SNF DVT ppx:  Xarlto upon D/C D/C scripts on chart. Plan for 2 week outpatient post-op visit.  Alfredo Martinez PA-C [facility] Office:  [phone]

## 2021-03-06 NOTE — Progress Notes (Signed)
Pt noted to be very agitated at this time. Pt reports that there was a delay taking him off the bedpan and had taken and cleaned self once finished on bed pan. Pt states "I rang three times and they told me that someone was coming!" Pt verbalizes several times during interaction with this writer how angry he is. Pt informed that direct care staff had mistakenly not been notified. Pt given scheduled meds and prn pain medication. Needs attended to and pt declines any further interaction at this time. Will continue to monitor. Charge nurse, Clare Charon made aware of pt's concerns.

## 2021-03-07 LAB — GLUCOSE, CAPILLARY
Glucose-Capillary: 114 mg/dL — ABNORMAL HIGH (ref 70–99)
Glucose-Capillary: 113 mg/dL — ABNORMAL HIGH (ref 70–99)
Glucose-Capillary: 92 mg/dL (ref 70–99)
Glucose-Capillary: 125 mg/dL — ABNORMAL HIGH (ref 70–99)
Glucose-Capillary: 132 mg/dL — ABNORMAL HIGH (ref 70–99)

## 2021-03-07 NOTE — Progress Notes (Signed)
Subjective: 5 Days Post-Op Procedure(s) (LRB): Revision left subtalar arthrodesis (Left) Patient reports pain as mild.   CSW notes reviewed.  Still searching for SNF.  Pt is tolerating a regular diet.    Objective: Vital signs in last 24 hours: Temp:  [97.8 F (36.6 C)-98.6 F (37 C)] 97.9 F (36.6 C) (12/13 1522) Pulse Rate:  [75-93] 85 (12/13 1522) Resp:  [16-18] 16 (12/13 1522) BP: (112-142)/(79-106) 112/79 (12/13 1522) SpO2:  [99 %-100 %] 99 % (12/13 1522)  Intake/Output from previous day: 12/12 0701 - 12/13 0700 In: -  Out: 1550 [Urine:1550] Intake/Output this shift: No intake/output data recorded.  No results for input(s): HGB in the last 72 hours. No results for input(s): WBC, RBC, HCT, PLT in the last 72 hours. No results for input(s): NA, K, CL, CO2, BUN, CREATININE, GLUCOSE, CALCIUM in the last 72 hours. No results for input(s): LABPT, INR in the last 72 hours.  Blood sugars staying in reasonably good control. PE: wn wd male in nad.  L LE splinted.  Brisk cap refill at the toes.   Assessment/Plan: 5 Days Post-Op Procedure(s) (LRB): Revision left subtalar arthrodesis (Left) Up with therapy Discharge to SNF  NWB on L LE.   Toni Arthurs 03/07/2021, 5:28 PM

## 2021-03-07 NOTE — Progress Notes (Signed)
Mobility Specialist Progress Note   03/07/21 1331  Mobility  Activity Ambulated in room  Level of Assistance Contact guard assist, steadying assist  Assistive Device Front wheel walker  LLE Weight Bearing NWB  Distance Ambulated (ft) 54 ft  Mobility Ambulated with assistance in room  Mobility Response Tolerated well  Mobility performed by Mobility specialist  $Mobility charge 1 Mobility   Pt c/o being a little woozy but VSS and agreeable to mobility. Mod I throughout session w/ minor cues for body positioning in RW. No complaints throughout session, Returned back to bed w/ call bell by side and all needs met.    Holland Falling Mobility Specialist Phone Number [phone]

## 2021-03-07 NOTE — Plan of Care (Signed)

## 2021-03-07 NOTE — Progress Notes (Signed)
Mobility Specialist Progress Note   03/07/21 1815  Mobility  Activity Ambulated in hall  Level of Assistance Contact guard assist, steadying assist  LLE Weight Bearing NWB  Distance Ambulated (ft) 130 ft  Mobility Ambulated with assistance in hallway  Mobility Response Tolerated well  Mobility performed by Mobility specialist  $Mobility charge 1 Mobility   Received pt in bed having no complaints and agreeable to mobility. Asymptomatic throughout ambulation, returned back to bed w/ call bell by side and all needs met.  Holland Falling Mobility Specialist Phone Number [phone]

## 2021-03-07 NOTE — TOC Progression Note (Addendum)
Transition of Care [facility]) - Progression Note    Patient Details  Name: Cody [name] MRN: [id number] Date of Birth: 1970-09-01  [facility]) CM/SW Contact  Lorri Frederick, LCSW Phone Number: 03/07/2021, 11:35 AM  Clinical Narrative:  Message from Harlan, case manager.  The authorization is going to be approved, she asked if any facility has been located.  No bed offers currently.    1245: CSW updated pt, no bed offers, he agrees to expand the search to more facilities.  1345: Bertis Ruddy, [facility].  She cannot accept a pt who does not have a place to discharge to afterwards.    Expected Discharge Plan: Skilled Nursing Facility Barriers to Discharge: Continued Medical Work up, SNF Pending bed offer, Other (must enter comment) (Worker's comp payer)  Expected Discharge Plan and Services Expected Discharge Plan: Skilled Nursing Facility In-house Referral: Clinical Social Work Discharge Planning Services: CM Consult Post Acute Care Choice: Skilled Nursing Facility Living arrangements for the past 2 months: Homeless                                       Social Determinants of Health (SDOH) Interventions    Readmission Risk Interventions No flowsheet data found.

## 2021-03-08 LAB — GLUCOSE, CAPILLARY
Glucose-Capillary: 105 mg/dL — ABNORMAL HIGH (ref 70–99)
Glucose-Capillary: 108 mg/dL — ABNORMAL HIGH (ref 70–99)
Glucose-Capillary: 97 mg/dL (ref 70–99)
Glucose-Capillary: 107 mg/dL — ABNORMAL HIGH (ref 70–99)

## 2021-03-08 NOTE — Progress Notes (Signed)
Occupational Therapy Treatment Patient Details Name: Cody [name] MRN: [id number] DOB: [birth year] Today's Date: 03/08/2021   History of present illness Pt. 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  PMH: DM, HTN   OT comments  Patient with continued progression toward all patient focused goals.  Patient is having a little more discomfort to the lateral aspect of his L ankle, but is still moving quite well.  Generalized supervision for mobility and standing tasks, but no assist needed.  He continues to work with the mobility team, and is awaiting placement at aq local SNF for post acute rehab given NWB status.  OT can continue efforts in the acute setting as needed.     Recommendations for follow up therapy are one component of a multi-disciplinary discharge planning process, led by the attending physician.  Recommendations may be updated based on patient status, additional functional criteria and insurance authorization.    Follow Up Recommendations  Skilled nursing-short term rehab (<3 hours/day)    Assistance Recommended at Discharge Intermittent Supervision/Assistance  Equipment Recommendations       Recommendations for Other Services      Precautions / Restrictions Precautions Precautions: Fall Restrictions Weight Bearing Restrictions: Yes LLE Weight Bearing: Non weight bearing       Mobility Bed Mobility Overal bed mobility: Modified Independent                  Transfers     Transfers: Sit to/from Stand Sit to Stand: Modified independent (Device/Increase time);From elevated surface                 Balance Overall balance assessment: Needs assistance   Sitting balance-Leahy Scale: Normal     Standing balance support: Reliant on assistive device for balance Standing balance-Leahy Scale: Fair Standing  balance comment: able to static stand and maintain NWB                           ADL either performed or assessed with clinical judgement   ADL       Grooming: Wash/dry hands;Wash/dry face;Supervision/safety;Standing;Oral care                   Toilet Transfer: Supervision/safety;Regular Toilet;Ambulation;Rolling walker (2 wheels);Modified Independent   Toileting- Clothing Manipulation and Hygiene: Independent;Sitting/lateral lean       Functional mobility during ADLs: Modified independent;Supervision/safety;Rolling walker (2 wheels)      Extremity/Trunk Assessment Upper Extremity Assessment Upper Extremity Assessment: Overall WFL for tasks assessed       Cervical / Trunk Assessment Cervical / Trunk Assessment: Normal    Vision Baseline Vision/History: 1 Wears glasses Patient Visual Report: No change from baseline     Perception Perception Perception: Not tested   Praxis Praxis Praxis: Not tested    Cognition Arousal/Alertness: Awake/alert Behavior During Therapy: WFL for tasks assessed/performed Overall Cognitive Status: Within Functional Limits for tasks assessed                                                              Pertinent Vitals/ Pain       Faces Pain Scale: Hurts  little more Pain Location: L lower leg/ankle Pain Descriptors / Indicators: Aching;Sharp Pain Intervention(s): Monitored during session                                                          Frequency  Min 2X/week        Progress Toward Goals  OT Goals(current goals can now be found in the care plan section)  Progress towards OT goals: Progressing toward goals  Acute Rehab OT Goals Patient Stated Goal: I'm hoping they found a place for me to go to today. OT Goal Formulation: With patient Time For Goal Achievement: 03/17/21 Potential to Achieve Goals: Good  Plan Discharge plan remains appropriate     Co-evaluation                 AM-PAC OT "6 Clicks" Daily Activity     Outcome Measure   Help from another person eating meals?: None Help from another person taking care of personal grooming?: None Help from another person toileting, which includes using toliet, bedpan, or urinal?: None Help from another person bathing (including washing, rinsing, drying)?: A Little Help from another person to put on and taking off regular upper body clothing?: None Help from another person to put on and taking off regular lower body clothing?: A Little 6 Click Score: 22    End of Session Equipment Utilized During Treatment: Rolling walker (2 wheels)  OT Visit Diagnosis: Pain Pain - part of body: Ankle and joints of foot   Activity Tolerance Patient tolerated treatment well   Patient Left in bed;with call bell/phone within reach   Nurse Communication          Time: 7116-5790 OT Time Calculation (min): 16 min  Charges: OT General Charges $OT Visit: 1 Visit OT Treatments $Self Care/Home Management : 8-22 mins  03/08/2021  RP, OTR/L  Acute Rehabilitation Services  Office:  [phone]   Suzanna Obey 03/08/2021, 9:35 AM

## 2021-03-08 NOTE — Progress Notes (Signed)
Mobility Specialist Progress Note   03/08/21 1822  Mobility  Activity Ambulated in hall  Level of Assistance Contact guard assist, steadying assist  Assistive Device Front wheel walker  LLE Weight Bearing NWB  Distance Ambulated (ft) 130 ft  Mobility Ambulated with assistance in hallway  Mobility Response Tolerated well  Mobility performed by Mobility specialist  $Mobility charge 1 Mobility   Received pt in bed having no complaints and agreeable to mobility. Asymptomatic throughout ambulation, returned back to bed w/ call bell by side and all needs met.  Holland Falling Mobility Specialist Phone Number [phone]

## 2021-03-08 NOTE — TOC Progression Note (Addendum)
Transition of Care [facility]) - Progression Note    Patient Details  Name: Cody [name] MRN: [id number] Date of Birth: 06-24-1970  [facility]) CM/SW Contact  Lorri Frederick, LCSW Phone Number: 03/08/2021, 10:44 AM  Clinical Narrative:    Following efforts were made to locate SNF placement: [facility] : no beds [facility]: no 6 week bed available [facility]: Mariella Saa is full at her facilities in this region but does have some openings at facilities several hours away.  She would be willing to consider worker's comp payer, the homeless issue is still a barrier.  She will review the referral for the other facilities and call back tomorrow. [facility]: cannot accept workers Engineer, production   Expected Discharge Plan: Skilled Nursing Facility Barriers to Discharge: Continued Medical Work up, SNF Pending bed offer, Other (must enter comment) (Worker's comp payer)  Expected Discharge Plan and Services Expected Discharge Plan: Skilled Nursing Facility In-house Referral: Clinical Social Work Discharge Planning Services: [employer] Consult Post Acute Care Choice: Skilled Nursing Facility Living arrangements for the past 2 months: Homeless                                       Social Determinants of Health (SDOH) Interventions    Readmission Risk Interventions No flowsheet data found.

## 2021-03-08 NOTE — Progress Notes (Signed)
Mobility Specialist Progress Note   03/08/21 1300  Mobility  Activity Ambulated in room  Level of Assistance Modified independent, requires aide device or extra time  Assistive Device Front wheel walker  LLE Weight Bearing NWB  Distance Ambulated (ft) 50 ft  Mobility Ambulated with assistance in room  Mobility Response Tolerated well  Mobility performed by Mobility specialist  $Mobility charge 1 Mobility   Pt having slight complaint of dizziness upon entry, quickly subsided when sitting EOB. No complaints during ambulation in room. Return back to bed w/ all needs met.   Holland Falling Mobility Specialist Phone Number [phone]

## 2021-03-08 NOTE — Progress Notes (Signed)
Subjective: 6 Days Post-Op Procedure(s) (LRB): Revision left subtalar arthrodesis (Left)  Patient working well with therapy.  Awaiting SNF placement  Objective:   VITALS:  Temp:  [97.9 F (36.6 C)-98.5 F (36.9 C)] 98.5 F (36.9 C) (12/14 0700) Pulse Rate:  [80-90] 80 (12/14 0700) Resp:  [16-18] 18 (12/14 0700) BP: (101-120)/(66-79) 101/73 (12/14 0700) SpO2:  [99 %-100 %] 99 % (12/14 0700)     LABS No results for input(s): HGB, WBC, PLT in the last 72 hours. No results for input(s): NA, K, CL, CO2, BUN, CREATININE, GLUCOSE in the last 72 hours. No results for input(s): LABPT, INR in the last 72 hours.   Assessment/Plan: 6 Days Post-Op Procedure(s) (LRB): Revision left subtalar arthrodesis (Left)  NWB L LE Up with therapy Disp:  SNF, awaiting bed availability DVT ppx:  Xarelto upon D/C D/C scripts on chart.   Alfredo Martinez PA-C [facility] Office:  [phone]

## 2021-03-08 NOTE — Plan of Care (Signed)

## 2021-03-09 ENCOUNTER — Inpatient Hospital Stay (HOSPITAL_COMMUNITY): Payer: No Typology Code available for payment source

## 2021-03-09 DIAGNOSIS — M960 Pseudarthrosis after fusion or arthrodesis: Principal | ICD-10-CM

## 2021-03-09 LAB — CREATININE, SERUM
Creatinine, Ser: 2.36 mg/dL — ABNORMAL HIGH (ref 0.61–1.24)
GFR, Estimated: 32 mL/min — ABNORMAL LOW (ref >60)

## 2021-03-09 LAB — GLUCOSE, CAPILLARY
Glucose-Capillary: 112 mg/dL — ABNORMAL HIGH (ref 70–99)
Glucose-Capillary: 85 mg/dL (ref 70–99)
Glucose-Capillary: 96 mg/dL (ref 70–99)
Glucose-Capillary: 84 mg/dL (ref 70–99)
Glucose-Capillary: 91 mg/dL (ref 70–99)

## 2021-03-09 MED ORDER — SODIUM CHLORIDE 0.9 % IV SOLN: INTRAVENOUS | Status: DC

## 2021-03-09 MED ORDER — INSULIN ASPART 100 UNIT/ML IJ SOLN
0.0000 [IU] | Freq: Three times a day (TID) | INTRAMUSCULAR | Status: DC
  Administered 2021-03-11: 1 [IU] via SUBCUTANEOUS

## 2021-03-09 MED ORDER — HYDRALAZINE HCL 25 MG PO TABS
25.0000 mg | ORAL_TABLET | Freq: Four times a day (QID) | ORAL | Status: DC | PRN
  Filled 2021-03-09: qty 1

## 2021-03-09 NOTE — Progress Notes (Signed)
Subjective: 7 Days Post-Op Procedure(s) (LRB): Revision left subtalar arthrodesis (Left)  Patient sleeping soundly upon arrival.  I loudly said his name for a about a minute and could not rouse him.  Notes reviewed from social work and therapy.  Objective:   VITALS:  Temp:  [98 F (36.7 C)-98.6 F (37 C)] 98 F (36.7 C) (12/15 0610) Pulse Rate:  [68-79] 68 (12/15 0610) Resp:  [16-18] 16 (12/15 0610) BP: (99-109)/(66-82) 99/66 (12/15 0610) SpO2:  [99 %-100 %] 100 % (12/15 0610)  General: WDWN patient in NAD. Psych:  Sleeping soundly Neuro:  Unable to adequately assess this morning. HEENT:  EOMs intact Chest:  Even non-labored respirations Skin:  SLS C/D/I, no rashes or lesions Extremities: warm/dry, no edema, erythema or echymosis.  No lymphadenopathy. Pulses: Popliteus 2+ MSK/Vasc:  Cap refill 2+   LABS No results for input(s): HGB, WBC, PLT in the last 72 hours. Recent Labs    03/09/21 0553  CREATININE 2.36*   No results for input(s): LABPT, INR in the last 72 hours.   Assessment/Plan: 7 Days Post-Op Procedure(s) (LRB): Revision left subtalar arthrodesis (Left)  NWB L LE Up with therapy Disp:  SNF.  Awaiting bed placement.  Patient ready for D/C once bed is found. D/C scripts on chart.   Alfredo Martinez PA-C [facility] Office:  [phone]

## 2021-03-09 NOTE — Consult Note (Signed)
[facility] Medical Consultation  Shahab Polhamus CWC:[id number] DOB: 03-14-1971 DOA: 03/02/2021 PCP: [facility]   Requesting physician: Dr. Victorino Dike Date of consultation: 03/09/2021 Reason for consultation: AKI  Impression/Recommendations Principal Problem:   Nonunion of subtalar arthrodesis    AKI, probably overstringent BP control, thus pre-renal, as shown in the graph  His SBP<120 for last 3 day's reading, will hold off home BP meds amlodipine, chlorthalidone and lisinopril, continue Coreg, add as needed hydralazine for blood pressure 150/100. -D/C Metformin -We will give gentle hydration with normal saline at 125 mL/h for 1 day -Check renal ultrasound. -Repeat BMP tomorrow  IIDM -Continue sliding scale for now  Left foot arthrodesis  -Pain controlled -No other acute issue.  I will followup again tomorrow. Please contact me if I can be of assistance in the meanwhile. Thank you for this consultation.  Chief Complaint: Feeling ok  HPI:   50 year old male patient with past medical history of diabetic with left foot pain status post arthrodesis in July 2020, presented with painful nonunion left foot hardware underwent arthrodesis of left foot on 12/8. Medical team consulted for patient developed AKI in the last 3 days with trending up of creatinine to 2.6 compared to preop 1.3.  Patient has no major complaints, surgical pain controlled, and he seems recovers fairly well after surgery, denies any GI symptoms, no nausea vomiting no diarrhea, and he has been eating and drinking well.  Review chart showed patient blood pressure borderline low for the last 3 days, as shown in the graph attached.  Patient reported that recently his PCP has readjusted his BP meds, added chlorthalidone.  Before that his SBP usually in the range of 140s to 150s and DBP 90s to 100.  No history of kidney stone, no history of gout.   Review of Systems:  14 point review system done negative  except as mentioned in HPI.  Past Medical History:  Diagnosis Date   Acid reflux    Diabetes mellitus without complication (HCC)    Type II   Hypertension    Mixed hyperlipidemia    Sleep apnea    No cpap   Past Surgical History:  Procedure Laterality Date   ANKLE FUSION Left 03/02/2021   Procedure: Revision left subtalar arthrodesis;  Surgeon: Toni Arthurs, MD;  Location: [facility] OR;  Service: Orthopedics;  Laterality: Left;   FOOT ARTHRODESIS Left 09/25/2018   Procedure: Left subtalar joint arthrodesis and peroneal tendon repair;  Surgeon: Toni Arthurs, MD;  Location: [facility];  Service: Orthopedics;  Laterality: Left;   KNEE SURGERY     Social History:  reports that he has quit smoking. He has never used smokeless tobacco. He reports that he does not drink alcohol and does not use drugs.  No Known Allergies Family History  Problem Relation Age of Onset   Hypertension Other     Prior to Admission medications   Medication Sig Start Date End Date Taking? Authorizing Provider  amLODipine (NORVASC) 10 MG tablet Take 10 mg by mouth daily.  04/04/18  Yes [provider]  atorvastatin (LIPITOR) 40 MG tablet Take 40 mg by mouth daily.  05/30/18  Yes [provider]  carvedilol (COREG) 6.25 MG tablet Take 6.25 mg by mouth 2 (two) times daily with a meal.   Yes [provider]  chlorthalidone (HYGROTON) 25 MG tablet Take 25 mg by mouth daily.   Yes [provider]  docusate sodium (COLACE) 100 MG capsule Take 1 capsule (100  mg total) by mouth 2 (two) times daily. While taking narcotic pain medicine. 03/03/21  Yes Jacinta Shoe, PA-C  lisinopril (ZESTRIL) 40 MG tablet Take 40 mg by mouth daily.   Yes [provider]  magnesium oxide (MAG-OX) 400 MG tablet Take 400 mg by mouth daily.   Yes [provider]  metFORMIN (GLUCOPHAGE-XR) 500 MG 24 hr tablet Take 1,000 mg by mouth 2 (two) times daily.   Yes [provider]   pantoprazole (PROTONIX) 40 MG tablet Take 40 mg by mouth daily. 05/19/19  Yes [provider]  rivaroxaban (XARELTO) 10 MG TABS tablet Take 1 tablet (10 mg total) by mouth daily. 03/03/21  Yes Ollis, Justin Pike, PA-C  RYBELSUS 7 MG TABS Take 7 mg by mouth daily. 10/06/20  Yes [provider]  senna (SENOKOT) 8.6 MG TABS tablet Take 2 tablets (17.2 mg total) by mouth 2 (two) times daily. 03/03/21  Yes Jacinta Shoe, PA-C   Physical Exam: Blood pressure 103/64, pulse 79, temperature 98.1 F (36.7 C), temperature source Oral, resp. rate 17, height 6\' 2"  (1.88 m), weight 125.2 kg, SpO2 100 %. Vitals:   03/09/21 0748 03/09/21 1511  BP: 100/73 103/64  Pulse: 70 79  Resp: 17 17  Temp: 98 F (36.7 C) 98.1 F (36.7 C)  SpO2: 100% 100%    General: No acute distress Eyes: PERRL ENT: Mucous membranes moist Neck: Supple notably Cardiovascular: RRR, no murmurs Respiratory: Clear breathing sound bilaterally, no crackles no wheezing Abdomen: Soft nontender nondistended, no CVA tenderness Skin: No rash no ulcer Musculoskeletal: Normal ROM Psychiatric: Calm Neurologic: No focal deficit  Labs on Admission:  Basic Metabolic Panel: Recent Labs  Lab 03/09/21 0553  CREATININE 2.36*   Liver Function Tests: No results for input(s): AST, ALT, ALKPHOS, BILITOT, PROT, ALBUMIN in the last 168 hours. No results for input(s): LIPASE, AMYLASE in the last 168 hours. No results for input(s): AMMONIA in the last 168 hours. CBC: No results for input(s): WBC, NEUTROABS, HGB, HCT, MCV, PLT in the last 168 hours. Cardiac Enzymes: No results for input(s): CKTOTAL, CKMB, CKMBINDEX, TROPONINI in the last 168 hours. BNP: Invalid input(s): POCBNP CBG: Recent Labs  Lab 03/08/21 1130 03/08/21 1706 03/08/21 2118 03/09/21 0608 03/09/21 1122  GLUCAP 107* 112* 85 96 84    Radiological Exams on Admission: No results found.  EKG: Ordered  Time spent: 35  36 [facility] Pager [phone] 03/09/2021, 3:47 PM

## 2021-03-09 NOTE — TOC Progression Note (Addendum)
Transition of Care [facility]) - Progression Note    Patient Details  Name: Cody [name] MRN: [id number] Date of Birth: 1970-11-10  [facility] CM/SW Contact  Lorri Frederick, LCSW Phone Number: 03/09/2021, 1:14 PM  Clinical Narrative:    The following efforts were made to locate SNF:  CSW received call from [facility], [employer], [phone], ext [phone].  She will be taking over the SNF search from the worker's comp side, has been out of office, asked for update.  She had heard that pt may have a sister he can go to after rehab?  CSW spoke with pt, updated him that we do not have bed offers.  Asked about sister?  Pt reports he does not have a sister he can stay with or any other family/friend situation who would be able to take him in after rehab.  Also asked about CPAP--pt does not have a cpap machine, but is aware of his sleep apnea dx.   CSW reached out to the following facilities: [facility] [place]-reviewing referral.  Rosey Bath reported it is a possibility, needs to talk to her admin [place] with [facility], who called back: they do not take worker's comp [facility] said they can possibly take pt.  She asked about sleep apnea dx and cpap.  Messaged Ollis PA who reports pt is not on CPAP, has not been recommended for one [place] will review [facility] in [place]-per [place], they cannot accept workers comp Building services engineer take workers comp   Expected Discharge Plan: Skilled Nursing Facility Barriers to Discharge: Continued Medical Work up, SNF Pending bed offer, Other (must enter comment) (Worker's comp payer)  Expected Discharge Plan and Services Expected Discharge Plan: Skilled Nursing Facility In-house Referral: Clinical Social Work Discharge Planning Services: [employer] Consult Post Acute Care Choice: Skilled Nursing Facility Living arrangements for the past 2 months: Homeless                                        Social Determinants of Health (SDOH) Interventions    Readmission Risk Interventions No flowsheet data found.

## 2021-03-09 NOTE — Progress Notes (Signed)
Mobility Specialist Progress Note   03/09/21 1800  Mobility  Activity Off unit (PATIENT OFF UNIT FOR IMAGING)   Frederico Hamman Mobility Specialist Phone Number [phone]

## 2021-03-09 NOTE — Progress Notes (Signed)
Mobility Specialist Progress Note   03/09/21 1320  Mobility  Activity Ambulated in hall  Level of Assistance Standby assist, set-up cues, supervision of patient - no hands on  Assistive Device Front wheel walker  LLE Weight Bearing NWB  Distance Ambulated (ft) 55 ft  Mobility Ambulated with assistance in room  Mobility Response Tolerated well  Mobility performed by Mobility specialist  $Mobility charge 1 Mobility   Received pt in bed having no complaints and agreeable to mobility. Asymptomatic throughout ambulation, returned back to bed w/ call bell by side and all needs met.  Holland Falling Mobility Specialist Phone Number [phone]

## 2021-03-09 NOTE — Plan of Care (Signed)

## 2021-03-10 LAB — BASIC METABOLIC PANEL
Anion gap: 10 (ref 5–15)
BUN: 40 mg/dL — ABNORMAL HIGH (ref 6–20)
CO2: 23 mmol/L (ref 22–32)
Calcium: 8.9 mg/dL (ref 8.9–10.3)
Chloride: 98 mmol/L (ref 98–111)
Creatinine, Ser: 1.79 mg/dL — ABNORMAL HIGH (ref 0.61–1.24)
GFR, Estimated: 45 mL/min — ABNORMAL LOW (ref >60)
Glucose, Bld: 99 mg/dL (ref 70–99)
Potassium: 3.3 mmol/L — ABNORMAL LOW (ref 3.5–5.1)
Sodium: 131 mmol/L — ABNORMAL LOW (ref 135–145)

## 2021-03-10 LAB — GLUCOSE, CAPILLARY
Glucose-Capillary: 98 mg/dL (ref 70–99)
Glucose-Capillary: 120 mg/dL — ABNORMAL HIGH (ref 70–99)
Glucose-Capillary: 125 mg/dL — ABNORMAL HIGH (ref 70–99)
Glucose-Capillary: 119 mg/dL — ABNORMAL HIGH (ref 70–99)
Glucose-Capillary: 125 mg/dL — ABNORMAL HIGH (ref 70–99)

## 2021-03-10 MED ORDER — CARVEDILOL 3.125 MG PO TABS
3.1250 mg | ORAL_TABLET | Freq: Two times a day (BID) | ORAL | Status: DC
  Administered 2021-03-10: 3.125 mg via ORAL
  Filled 2021-03-10: qty 1

## 2021-03-10 MED ORDER — SODIUM CHLORIDE 0.9 % IV SOLN: INTRAVENOUS | Status: AC

## 2021-03-10 MED ORDER — POTASSIUM CHLORIDE CRYS ER 20 MEQ PO TBCR: 40.0000 meq | EXTENDED_RELEASE_TABLET | Freq: Once | ORAL | Status: DC

## 2021-03-10 NOTE — TOC Progression Note (Addendum)
Transition of Care [facility]) - Progression Note    Patient Details  Name: Cody [name] MRN: [id number] Date of Birth: 16-Jan-1971  [facility] CM/SW Contact  Carley Hammed, [place] Phone Number: 03/10/2021, 3:16 PM  Clinical Narrative:    CSW received notice back from [facility] they are unable to accept pt as he is mod I and has been dc/ed from PT. CSW spoke with Wilford Corner at one call [phone] ext [phone]) who will follow up with [facility] and CSW on Monday.   CSW followed up with [facility], [facility] is quarantined, and the other facilities have no openings. CSW spoke with Runell Gess at [facility] who noted that without family support, they cannot accept pt without family support or a discharge plan, which pt states he does not have.  CSW spoke with Enid Derry at [facility] who is having her billing department speak with workman's comp to see if they can negotiate a payment. CSW sent referral to [facility] to see if they would be able to accept pt, they are reviewing at this time. TOC will continue to follow for DC planning.   Expected Discharge Plan: Skilled Nursing Facility Barriers to Discharge: Continued Medical Work up, SNF Pending bed offer, Other (must enter comment) (Worker's comp payer)  Expected Discharge Plan and Services Expected Discharge Plan: Skilled Nursing Facility In-house Referral: Clinical Social Work Discharge Planning Services: [facility] Consult Post Acute Care Choice: Skilled Nursing Facility Living arrangements for the past 2 months: Homeless                                       Social Determinants of Health (SDOH) Interventions    Readmission Risk Interventions No flowsheet data found.

## 2021-03-10 NOTE — Progress Notes (Signed)
Subjective: 8 Days Post-Op Procedure(s) (LRB): Revision left subtalar arthrodesis (Left)  Called and spoke to patient this morning.  He informs me that he is working well with therapy and overall feeling well, but is ready to be D/C'd.  He notes that his pain is well controlled, although he notes frustration over the timing of which he gets his meds.  He's tolerating POs and admits to flatus.  Hospitalist was consulted yesterday due to elevated Serium Cr.  Objective:   VITALS:  Temp:  [98.1 F (36.7 C)-98.4 F (36.9 C)] 98.4 F (36.9 C) (12/15 2113) Pulse Rate:  [79-98] 98 (12/16 0950) Resp:  [16-18] 16 (12/16 0950) BP: (103-149)/(64-107) 149/107 (12/16 0950) SpO2:  [100 %] 100 % (12/16 0950)    LABS No results for input(s): HGB, WBC, PLT in the last 72 hours. Recent Labs    03/09/21 0553 03/10/21 0058  NA  --  131*  K  --  3.3*  CL  --  98  CO2  --  23  BUN  --  40*  CREATININE 2.36* 1.79*  GLUCOSE  --  99   No results for input(s): LABPT, INR in the last 72 hours.   Assessment/Plan: 8 Days Post-Op Procedure(s) (LRB): Revision left subtalar arthrodesis (Left)  NWB L LE Up with therapy Ready for D/C to SNF once bed available. DVT ppx:  Lovenox inhouse; transition to Xarelto upon D/C. D/C scripts on chart. Creatinine trending in the right direction. Appreciate Medicine team's assistance.   Alfredo Martinez PA-C [facility] Office:  [phone]

## 2021-03-10 NOTE — Progress Notes (Signed)
Reached out to the provider regarding pt request for anxiety medication

## 2021-03-10 NOTE — Progress Notes (Signed)
PROGRESS NOTE    Cody [name]  LOV:[id number] DOB: 06-30-1970 DOA: 03/02/2021 PCP: [facility]   Brief Narrative/Hospital Course: Cody [name], 50 y.o. male with PMH of diabetes with left foot infection and S/P arthrodesis in July 2020 presented with nonunion of left foot hardware underwent arthrodesis of left foot 12/8 and remain hospitalized under orthopedic surgery team.  He was noted to have creatinine of 2.612/14 compared to preop 1.3 and hospitalist was consulted.   Subjective: Seen examined this morning.  He appears little frustrated with the nursing staff last night requesting new nurses.  Also upset about timing of his medication Arthritis pain is controlled he feels ready for discharge  Assessment & Plan:  Acute kidney injury: Baseline creatinine 1.3 to be up last around 1.25, likely multifactorial due to hypotension dehydration multiple antihypertensives.  Creatinine now improving after holding amlodipine and chlorthalidone and lisinopril and starting IV fluids.  Further decrease the dose of Coreg to 3.125, continue hydralazine as needed, off metformin, continue current plan BMP in AM.  Renal ultrasound reassuring. Hypovolemic Hyponatremia-continue IV fluids monitor BMP Hypokalemia replacement ordered with care to  Nonunion of subtalar arthrodesis: Left foot arthrodesis due to nonunion continue pain control PT OT DVT prophylaxis as per primary orthopedics team  Insulin-dependent diabetes mellitus: Blood sugar is well controlled has BUN/Cr 0.512/8.  Once SSI resume metformin once AKI improves Recent Labs  Lab 03/09/21 0608 03/09/21 1122 03/09/21 1630 03/09/21 2113 03/10/21 0950  GLUCAP 96 84 98 91 120*     Deconditioning/debility continue PT OT awaiting for placement  Class II Obesity:Patient's Body mass index is 35.44 kg/m. : Will benefit with PCP follow-up, weight loss  healthy lifestyle and outpatient sleep evaluation.  DVT prophylaxis: enoxaparin (LOVENOX)  injection 40 mg Start: 03/03/21 0800 SCDs Start: 03/02/21 1253 Code Status:   Code Status: Full Code Family Communication: plan of care discussed with patient at bedside. Status is: Inpatient Remains inpatient appropriate because: Pending placement Disposition: Currently is medically stable for discharge. Anticipated Disposition: SNF   Objective: Vitals last 24 hrs: Vitals:   03/09/21 0748 03/09/21 1511 03/09/21 2113 03/10/21 0950  BP: 100/73 103/64 128/78 (!) 149/107  Pulse: 70 79 81 98  Resp: 17 17 18 16   Temp: 98 F (36.7 C) 98.1 F (36.7 C) 98.4 F (36.9 C)   TempSrc: Oral Oral Oral   SpO2: 100% 100% 100% 100%  Weight:      Height:       Weight change:   Intake/Output Summary (Last 24 hours) at 03/10/2021 1009 Last data filed at 03/10/2021 0700 Gross per 24 hour  Intake 751.25 ml  Output 1500 ml  Net -748.75 ml   Net IO Since Admission: -11,777.29 mL [03/10/21 1009]   Physical Examination: General exam: AA0X3, weak,older than stated age. HEENT:Oral mucosa moist, Ear/Nose WNL grossly,dentition normal. Respiratory system: B/l CLEAR BS, no use of accessory muscle, non tender. Cardiovascular system: S1 & S2 +,No JVD. Gastrointestinal system: Abdomen soft, NT,ND, BS+. Nervous System:Alert, awake, moving extremities. Extremities: edema none, left foot with dressing in place did not remove, distal peripheral pulses palpable.  Skin: No rashes, no icterus. MSK: Normal muscle bulk, tone, power.  Medications reviewed:  Scheduled Meds:  atorvastatin  40 mg Oral Daily   carvedilol  3.125 mg Oral BID WC   docusate sodium  100 mg Oral BID   enoxaparin (LOVENOX) injection  40 mg Subcutaneous Q24H   insulin aspart  0-9 Units Subcutaneous TID WC   magnesium oxide  400 mg Oral Daily   pantoprazole  40 mg Oral Daily   potassium chloride  40 mEq Oral Once   senna  1 tablet Oral BID   Continuous Infusions:  sodium chloride 125 mL/hr at 03/10/21 0216   methocarbamol  (ROBAXIN) IV      Diet Order             Diet Carb Modified Fluid consistency: Thin; Room service appropriate? Yes  Diet effective now                 Weight change:   Wt Readings from Last 3 Encounters:  03/02/21 125.2 kg  09/14/20 129.3 kg  11/25/19 (!) 137 kg     Consultants:see note  Procedures:see note Antimicrobials: Anti-infectives (From admission, onward)    Start     Dose/Rate Route Frequency Ordered Stop   03/02/21 0933  vancomycin (VANCOCIN) powder  Status:  Discontinued          As needed 03/02/21 0933 03/02/21 1047   03/02/21 0800  ceFAZolin (ANCEF) IVPB 3g/100 mL premix        3 g 200 mL/hr over 30 Minutes Intravenous On call to O.R. 03/02/21 0745 03/02/21 0921      Culture/Microbiology    Component Value Date/Time   SDES  11/25/2019 5631    BLOOD LEFT ANTECUBITAL Performed at [facility], [address], [place], [place] [address]    SPECREQUEST  11/25/2019 [phone]    BOTTLES DRAWN AEROBIC AND ANAEROBIC Blood Culture adequate volume Performed at [facility], [address] [place], [place] [address]    CULT  11/25/2019 [phone]    NO GROWTH 5 DAYS Performed at [facility], [address], [place], [place] [address]    REPTSTATUS 11/30/2019 FINAL 11/25/2019 6767    Other culture-see note  Unresulted Labs (From admission, onward)     Start     Ordered   03/11/21 0500  Basic metabolic panel  Tomorrow morning,   R       Question:  Specimen collection method  Answer:  Lab=Lab collect   03/10/21 0743   03/09/21 0500  Creatinine, serum  (enoxaparin (LOVENOX)    CrCl >/= 30 ml/min)  Weekly,   R     Comments: while on enoxaparin therapy    03/02/21 1252          Data Reviewed: I have personally reviewed following labs and imaging studies CBC: No results for input(s): WBC, NEUTROABS, HGB, HCT, MCV, PLT in the last 168 hours. Basic Metabolic Panel: Recent Labs  Lab 03/09/21 0553 03/10/21 0058  NA  --  131*  K  --   3.3*  CL  --  98  CO2  --  23  GLUCOSE  --  99  BUN  --  40*  CREATININE 2.36* 1.79*  CALCIUM  --  8.9   GFR: Estimated Creatinine Clearance: 67.9 mL/min (A) (by C-G formula based on SCr of 1.79 mg/dL (H)). Liver Function Tests: No results for input(s): AST, ALT, ALKPHOS, BILITOT, PROT, ALBUMIN in the last 168 hours. No results for input(s): LIPASE, AMYLASE in the last 168 hours. No results for input(s): AMMONIA in the last 168 hours. Coagulation Profile: No results for input(s): INR, PROTIME in the last 168 hours. Cardiac Enzymes: No results for input(s): CKTOTAL, CKMB, CKMBINDEX, TROPONINI in the last 168 hours. BNP (last 3 results) No results for input(s): PROBNP in the last 8760 hours. HbA1C: No  results for input(s): HGBA1C in the last 72 hours. CBG: Recent Labs  Lab 03/09/21 0608 03/09/21 1122 03/09/21 1630 03/09/21 2113 03/10/21 0950  GLUCAP 96 84 98 91 120*   Lipid Profile: No results for input(s): CHOL, HDL, LDLCALC, TRIG, CHOLHDL, LDLDIRECT in the last 72 hours. Thyroid Function Tests: No results for input(s): TSH, T4TOTAL, FREET4, T3FREE, THYROIDAB in the last 72 hours. Anemia Panel: No results for input(s): VITAMINB12, FOLATE, FERRITIN, TIBC, IRON, RETICCTPCT in the last 72 hours. Sepsis Labs: No results for input(s): PROCALCITON, LATICACIDVEN in the last 168 hours.  Recent Results (from the past 240 hour(s))  SARS Coronavirus 2 by RT PCR (hospital order, performed in [facility] hospital lab) Nasopharyngeal Nasopharyngeal Swab     Status: None   Collection Time: 03/02/21  7:27 AM   Specimen: Nasopharyngeal Swab  Result Value Ref Range Status   SARS Coronavirus 2 NEGATIVE NEGATIVE Final    Comment: (NOTE) SARS-CoV-2 target nucleic acids are NOT DETECTED.  The SARS-CoV-2 RNA is generally detectable in upper and lower respiratory specimens during the acute phase of infection. The lowest concentration of SARS-CoV-2 viral copies this assay can detect is  250 copies / mL. A negative result does not preclude SARS-CoV-2 infection and should not be used as the sole basis for treatment or other patient management decisions.  A negative result may occur with improper specimen collection / handling, submission of specimen other than nasopharyngeal swab, presence of viral mutation(s) within the areas targeted by this assay, and inadequate number of viral copies (<250 copies / mL). A negative result must be combined with clinical observations, patient history, and epidemiological information.  Fact Sheet for Patients:   BoilerBrush.com.cy  Fact Sheet for Healthcare Providers: https://pope.com/  This test is not yet approved or  cleared by the [place] FDA and has been authorized for detection and/or diagnosis of SARS-CoV-2 by FDA under an Emergency Use Authorization (EUA).  This EUA will remain in effect (meaning this test can be used) for the duration of the COVID-19 declaration under Section 564(b)(1) of the Act, 21 U.S.C. section 360bbb-3(b)(1), unless the authorization is terminated or revoked sooner.  Performed at [facility], [address], [place], [place] [address]   Surgical pcr screen     Status: None   Collection Time: 03/02/21  9:01 AM   Specimen: Nasal Mucosa; Nasal Swab  Result Value Ref Range Status   MRSA, PCR NEGATIVE NEGATIVE Final   Staphylococcus aureus NEGATIVE NEGATIVE Final    Comment: (NOTE) The Xpert SA Assay (FDA approved for NASAL specimens in patients 88 years of age and older), is one component of a comprehensive surveillance program. It is not intended to diagnose infection nor to guide or monitor treatment. Performed at [facility], [address] [place], [place] [address]      Radiology Studies: US RENAL  Result Date: 03/09/2021 CLINICAL DATA:  Acute kidney injury EXAM: RENAL / URINARY TRACT ULTRASOUND COMPLETE COMPARISON:  None.  FINDINGS: Right Kidney: Renal measurements: 10.7 x 5.0 x 3.9 = volume: 109 mL. Echogenicity within normal limits. No mass or hydronephrosis visualized. Left Kidney: Renal measurements: 11.0 x 5.4 x 4.7 = volume: 145 mL. Echogenicity within normal limits. No mass or hydronephrosis visualized. Bladder: Appears normal for degree of bladder distention. Other: None. IMPRESSION: Normal examination Electronically Signed   By: Larose Hires D.O.   On: 03/09/2021 18:36     LOS: 8 days   Lanae Boast, MD [facility]  03/10/2021, 10:09 AM

## 2021-03-10 NOTE — Progress Notes (Signed)
Mobility Specialist Progress Note   03/10/21 1300  Mobility  Activity Ambulated in room  Level of Assistance Contact guard assist, steadying assist  Assistive Device Front wheel walker  LLE Weight Bearing NWB  Distance Ambulated (ft) 110 ft  Mobility Ambulated with assistance in room  Mobility Response Tolerated well  Mobility performed by Mobility specialist  $Mobility charge 1 Mobility   Received pt in bed having no complaints and agreeable to mobility. Asymptomatic throughout ambulation and increasing distance, returned back to bed w/ call bell by side and all needs met.  Holland Falling Mobility Specialist Phone Number [phone]

## 2021-03-10 NOTE — Progress Notes (Signed)
Pt has been refusing care  and medications this AM, he refused vitals and cbg.  At this time I was able to convince the pt to allow [place] to get vitals and CBG. Pt still refusing medications, and only requested medication for pain.  Pt stated that he doesn't feel like he is safe in this hospital and would rather be discharged and moved to another hospital

## 2021-03-10 NOTE — Plan of Care (Signed)

## 2021-03-11 LAB — GLUCOSE, CAPILLARY
Glucose-Capillary: 128 mg/dL — ABNORMAL HIGH (ref 70–99)
Glucose-Capillary: 118 mg/dL — ABNORMAL HIGH (ref 70–99)
Glucose-Capillary: 114 mg/dL — ABNORMAL HIGH (ref 70–99)
Glucose-Capillary: 113 mg/dL — ABNORMAL HIGH (ref 70–99)

## 2021-03-11 LAB — BASIC METABOLIC PANEL
Anion gap: 6 (ref 5–15)
BUN: 24 mg/dL — ABNORMAL HIGH (ref 6–20)
CO2: 28 mmol/L (ref 22–32)
Calcium: 8.9 mg/dL (ref 8.9–10.3)
Chloride: 99 mmol/L (ref 98–111)
Creatinine, Ser: 1.43 mg/dL — ABNORMAL HIGH (ref 0.61–1.24)
GFR, Estimated: 59 mL/min — ABNORMAL LOW (ref >60)
Glucose, Bld: 112 mg/dL — ABNORMAL HIGH (ref 70–99)
Potassium: 3.5 mmol/L (ref 3.5–5.1)
Sodium: 133 mmol/L — ABNORMAL LOW (ref 135–145)

## 2021-03-11 MED ORDER — HYDROXYZINE HCL 10 MG PO TABS
10.0000 mg | ORAL_TABLET | Freq: Three times a day (TID) | ORAL | Status: DC | PRN
  Filled 2021-03-11: qty 1

## 2021-03-11 MED ORDER — CARVEDILOL 6.25 MG PO TABS
6.2500 mg | ORAL_TABLET | Freq: Two times a day (BID) | ORAL | Status: DC
  Administered 2021-03-11 – 2021-03-17 (×13): 6.25 mg via ORAL
  Filled 2021-03-11 (×14): qty 1

## 2021-03-11 NOTE — Progress Notes (Signed)
Mobility Specialist Progress Note    03/11/21 1414  Mobility  Activity Ambulated in room  Level of Assistance Standby assist, set-up cues, supervision of patient - no hands on  Assistive Device Front wheel walker  LLE Weight Bearing NWB  Distance Ambulated (ft) 110 ft  Mobility Ambulated with assistance in room  Mobility Response Tolerated well  Mobility performed by Mobility specialist  $Mobility charge 1 Mobility   Pt received in bed and agreeable. No complaints on walk. Returned to bed with call bell in reach.  [facility] Mobility Specialist  M.S. Primary Phone: [phone] M.S. Secondary Phone: [phone]

## 2021-03-11 NOTE — Progress Notes (Signed)
Mobility Specialist Progress Note    03/11/21 1747  Mobility  Activity Ambulated in hall  Level of Assistance Contact guard assist, steadying assist  Assistive Device Front wheel walker  LLE Weight Bearing NWB  Distance Ambulated (ft) 200 ft  Mobility Ambulated with assistance in hallway  Mobility Response Tolerated well  Mobility performed by Mobility specialist  $Mobility charge 1 Mobility   Pt received in bed and agreeable. No complaints on walk. Returned to bed with call bell in reach.   [facility] Mobility Specialist  M.S. Primary Phone: [phone] M.S. Secondary Phone: [phone]

## 2021-03-11 NOTE — Progress Notes (Signed)
PROGRESS NOTE    Cody [name]  ZOX:[id number] DOB: [birth year] DOA: 03/02/2021 PCP: [facility]   Brief Narrative/Hospital Course: Cody [name], 50 y.o. male with PMH of diabetes with left foot infection and S/P arthrodesis in July 2020 presented with nonunion of left foot hardware underwent arthrodesis of left foot 12/8 and remain hospitalized under orthopedic surgery team.  He was noted to have creatinine of 2.612/14 compared to preop 1.3 and hospitalist was consulted.   Subjective: Overnight afebrile vital stable Renal function is nicely improving on IV fluids Complaints of anxiety she is asking for anxiety pill I have added Atarax. Reports he has been eating well and does not want IV fluids.  Assessment & Plan:  Acute kidney injury: Baseline creatinine 1.3, bump in creatinine multifactorial due to hypotension dehydration multiple antihypertensives.  With IV fluids creatinine nicely improved.  Patient does not want any more IV fluids, I have encouraged oral intake . blood pressure meds has been adjusted-discontinued amlodipine and chlorthalidone and lisinopril.  His blood pressure uptrending increase Coreg to 6.25> he is off metformin, continue current plan BMP in AM.  Renal ultrasound reassuring.  Hypertension on multiple antihypertensives-adjusted due to soft blood pressure and AKI, will increase Coreg to 6.25 mg home dose, continue to hold rest of the medication I will follow-up  Hypovolemic Hyponatremia-improving. 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  Once SSI resume metformin once AKI improves Recent Labs  Lab 03/10/21 0950 03/10/21 1202 03/10/21 1602 03/10/21 2213 03/11/21 0825  GLUCAP 120* 125* 119* 125* 128*     Anxiety: Counseling .  Added as needed Atarax   Deconditioning/debility continue PT OT awaiting for placement  Class II Obesity:Patient's Body mass index is 35.44 kg/m. : Will benefit with PCP follow-up, weight loss  healthy lifestyle and outpatient sleep evaluation.  DVT prophylaxis: enoxaparin (LOVENOX) injection 40 mg Start: 03/03/21 0800 SCDs Start: 03/02/21 1253 Code Status:   Code Status: Full Code Family Communication: plan of care discussed with patient at bedside. Status is: Inpatient Remains inpatient appropriate because: Pending placement Disposition: Currently is medically stable for discharge. Anticipated Disposition: SNF   Objective: Vitals last 24 hrs: Vitals:   03/10/21 1702 03/10/21 2211 03/11/21 0501 03/11/21 0827  BP: (!) 160/69 128/88 112/85 (!) 150/102  Pulse: 95 71 63 98  Resp: Temp: 98.8 F (37.1 C) 98 F (36.7 C) 98.2 F (36.8 C) 98.8 F (37.1 C)  TempSrc: Oral Oral Oral Oral  SpO2:  100% 98% 100%  Weight:      Height:       Weight change:   Intake/Output Summary (Last 24 hours) at 03/11/2021 1005 Last data filed at 03/11/2021 0800 Gross per 24 hour  Intake 2391.92 ml  Output 2950 ml  Net -558.08 ml   Net IO Since Admission: -12,335.37 mL [03/11/21 1005]   Physical Examination: General exam: AAOx 3 older than stated age, weak appearing. HEENT:Oral mucosa moist, Ear/Nose WNL grossly, dentition normal. Respiratory system: bilaterally clear, no use of accessory muscle Cardiovascular system: S1 & S2 +, No JVD,. Gastrointestinal system: Abdomen soft, NT,ND, BS+ Nervous System:Alert, awake, moving extremities and grossly nonfocal Extremities: Left foot with dressing in place,distal peripheral pulses palpable.  Skin: No rashes,no icterus. MSK: Normal muscle bulk,tone, power .  Medications reviewed:  Scheduled Meds:  atorvastatin  40 mg Oral Daily   carvedilol  6.25 mg Oral BID WC   docusate sodium  100 mg Oral BID    enoxaparin (LOVENOX) injection  40 mg Subcutaneous Q24H   insulin aspart  0-9 Units Subcutaneous TID WC   magnesium oxide  400 mg Oral Daily   pantoprazole  40 mg Oral Daily   potassium chloride  40 mEq Oral Once   senna  1 tablet Oral BID   Continuous Infusions:  sodium chloride Stopped (03/11/21 1300)   methocarbamol (ROBAXIN) IV      Diet Order             Diet Carb Modified Fluid consistency: Thin; Room service appropriate? Yes  Diet effective now                 Weight change:   Wt Readings from Last 3 Encounters:  03/02/21 125.2 kg  09/14/20 129.3 kg  11/25/19 (!) 137 kg     Consultants:see note  Procedures:see note Antimicrobials: Anti-infectives (From admission, onward)    Start     Dose/Rate Route Frequency Ordered Stop   03/02/21 0933  vancomycin (VANCOCIN) powder  Status:  Discontinued          As needed 03/02/21 0933 03/02/21 1047   03/02/21 0800  ceFAZolin (ANCEF) IVPB 3g/100 mL premix        3 g 200 mL/hr over 30 Minutes Intravenous On call to O.R. 03/02/21 0745 03/02/21 0921      Culture/Microbiology    Component Value Date/Time   SDES  11/25/2019 0350    BLOOD LEFT ANTECUBITAL Performed at [facility], [address], [place], [place] [address]    SPECREQUEST  11/25/2019 [phone]    BOTTLES DRAWN AEROBIC AND ANAEROBIC Blood Culture adequate volume Performed at [facility], [address] [place], [place] [address]    CULT  11/25/2019 [phone]    NO GROWTH 5 DAYS Performed at [facility], [address] [place], [place] [address]    REPTSTATUS 11/30/2019 FINAL 11/25/2019 7510    Other culture-see note  Unresulted Labs (From admission, onward)     Start     Ordered   03/12/21 0500  Basic metabolic panel  Tomorrow morning,   R       Question:  Specimen collection method  Answer:  Lab=Lab collect   03/11/21 0736   03/09/21 0500  Creatinine, serum  (enoxaparin (LOVENOX)    CrCl >/= 30 ml/min)  Weekly,   R      Comments: while on enoxaparin therapy    03/02/21 1252          Data Reviewed: I have personally reviewed following labs and imaging studies CBC: No results for input(s): WBC, NEUTROABS, HGB, HCT, MCV, PLT in the last 168 hours. Basic Metabolic Panel: Recent Labs  Lab 03/09/21 0553 03/10/21 0058 03/11/21 0304  NA  --  131* 133*  K  --  3.3* 3.5  CL  --  98 99  CO2  --  23 28  GLUCOSE  --  99 112*  BUN  --  40* 24*  CREATININE 2.36* 1.79* 1.43*  CALCIUM  --  8.9 8.9   GFR: Estimated Creatinine Clearance: 85 mL/min (A) (by C-G formula based on SCr of 1.43 mg/dL (H)). Liver Function Tests: No results for input(s): AST, ALT, ALKPHOS, BILITOT, PROT, ALBUMIN in the last 168 hours. No results for input(s): LIPASE,  AMYLASE in the last 168 hours. No results for input(s): AMMONIA in the last 168 hours. Coagulation Profile: No results for input(s): INR, PROTIME in the last 168 hours. Cardiac Enzymes: No results for input(s): CKTOTAL, CKMB, CKMBINDEX, TROPONINI in the last 168 hours. BNP (last 3 results) No results for input(s): PROBNP in the last 8760 hours. HbA1C: No results for input(s): HGBA1C in the last 72 hours. CBG: Recent Labs  Lab 03/10/21 0950 03/10/21 1202 03/10/21 1602 03/10/21 2213 03/11/21 0825  GLUCAP 120* 125* 119* 125* 128*   Lipid Profile: No results for input(s): CHOL, HDL, LDLCALC, TRIG, CHOLHDL, LDLDIRECT in the last 72 hours. Thyroid Function Tests: No results for input(s): TSH, T4TOTAL, FREET4, T3FREE, THYROIDAB in the last 72 hours. Anemia Panel: No results for input(s): VITAMINB12, FOLATE, FERRITIN, TIBC, IRON, RETICCTPCT in the last 72 hours. Sepsis Labs: No results for input(s): PROCALCITON, LATICACIDVEN in the last 168 hours.  Recent Results (from the past 240 hour(s))  SARS Coronavirus 2 by RT PCR (hospital order, performed in [facility] hospital lab) Nasopharyngeal Nasopharyngeal Swab     Status: None   Collection Time: 03/02/21  7:27  AM   Specimen: Nasopharyngeal Swab  Result Value Ref Range Status   SARS Coronavirus 2 NEGATIVE NEGATIVE Final    Comment: (NOTE) SARS-CoV-2 target nucleic acids are NOT DETECTED.  The SARS-CoV-2 RNA is generally detectable in upper and lower respiratory specimens during the acute phase of infection. The lowest concentration of SARS-CoV-2 viral copies this assay can detect is 250 copies / mL. A negative result does not preclude SARS-CoV-2 infection and should not be used as the sole basis for treatment or other patient management decisions.  A negative result may occur with improper specimen collection / handling, submission of specimen other than nasopharyngeal swab, presence of viral mutation(s) within the areas targeted by this assay, and inadequate number of viral copies (<250 copies / mL). A negative result must be combined with clinical observations, patient history, and epidemiological information.  Fact Sheet for Patients:   BoilerBrush.com.cy  Fact Sheet for Healthcare Providers: https://pope.com/  This test is not yet approved or  cleared by the [place] FDA and has been authorized for detection and/or diagnosis of SARS-CoV-2 by FDA under an Emergency Use Authorization (EUA).  This EUA will remain in effect (meaning this test can be used) for the duration of the COVID-19 declaration under Section 564(b)(1) of the Act, 21 U.S.C. section 360bbb-3(b)(1), unless the authorization is terminated or revoked sooner.  Performed at [facility], [address] [place], [place] [address]   Surgical pcr screen     Status: None   Collection Time: 03/02/21  9:01 AM   Specimen: Nasal Mucosa; Nasal Swab  Result Value Ref Range Status   MRSA, PCR NEGATIVE NEGATIVE Final   Staphylococcus aureus NEGATIVE NEGATIVE Final    Comment: (NOTE) The Xpert SA Assay (FDA approved for NASAL specimens in patients 52 years of age and  older), is one component of a comprehensive surveillance program. It is not intended to diagnose infection nor to guide or monitor treatment. Performed at [facility], [address], [place], [place] [address]      Radiology Studies: US RENAL  Result Date: 03/09/2021 CLINICAL DATA:  Acute kidney injury EXAM: RENAL / URINARY TRACT ULTRASOUND COMPLETE COMPARISON:  None. FINDINGS: Right Kidney: Renal measurements: 10.7 x 5.0 x 3.9 = volume: 109 mL. Echogenicity within normal limits. No mass or hydronephrosis visualized. Left Kidney: Renal  measurements: 11.0 x 5.4 x 4.7 = volume: 145 mL. Echogenicity within normal limits. No mass or hydronephrosis visualized. Bladder: Appears normal for degree of bladder distention. Other: None. IMPRESSION: Normal examination Electronically Signed   By: Larose Hires D.O.   On: 03/09/2021 18:36     LOS: 9 days   Lanae Boast, MD [facility]  03/11/2021, 10:05 AM

## 2021-03-11 NOTE — Progress Notes (Signed)
° °  Subjective: 9 Days Post-Op Procedure(s) (LRB): Revision left subtalar arthrodesis (Left) Patient reports pain as moderate Patient seen in rounds for Dr. Victorino Dike. Patient is well. He reports that he has not been pleased with his care while in the hospital. No acute events overnight. Voiding without difficulty. Patient awaiting SNF bed.    Objective: Vital signs in last 24 hours: Temp:  [97.9 F (36.6 C)-98.8 F (37.1 C)] 98.8 F (37.1 C) (12/17 0827) Pulse Rate:  [63-98] 98 (12/17 0827) Resp:  [16-17] 16 (12/17 0827) BP: (112-160)/(69-107) 150/102 (12/17 0827) SpO2:  [98 %-100 %] 100 % (12/17 0827)  Intake/Output from previous day:  Intake/Output Summary (Last 24 hours) at 03/11/2021 0913 Last data filed at 03/11/2021 0648 Gross per 24 hour  Intake 1911.92 ml  Output 2350 ml  Net -438.08 ml     Intake/Output this shift: No intake/output data recorded.  Labs: No results for input(s): HGB in the last 72 hours. No results for input(s): WBC, RBC, HCT, PLT in the last 72 hours. Recent Labs    03/10/21 0058 03/11/21 0304  NA 131* 133*  K 3.3* 3.5  CL 98 99  CO2 23 28  BUN 40* 24*  CREATININE 1.79* 1.43*  GLUCOSE 99 112*  CALCIUM 8.9 8.9   No results for input(s): LABPT, INR in the last 72 hours.  Exam: General - Patient is Alert and Oriented Extremity - Neurologically intact Sensation intact distally Dressing - dressing C/D/I Motor Function - intact, moving toes well on exam.   Past Medical History:  Diagnosis Date   Acid reflux    Diabetes mellitus without complication (HCC)    Type II   Hypertension    Mixed hyperlipidemia    Sleep apnea    No cpap    Assessment/Plan: 9 Days Post-Op Procedure(s) (LRB): Revision left subtalar arthrodesis (Left) Principal Problem:   Nonunion of subtalar arthrodesis  Estimated body mass index is 35.44 kg/m as calculated from the following:   Height as of this encounter: 6\' 2"  (1.88 m).   Weight as of this  encounter: 125.2 kg. Advance diet Up with therapy   NWB L LE Up with therapy Ready for D/C to SNF once bed available. DVT ppx:  Lovenox inhouse; transition to Xarelto upon D/C. D/C scripts on chart. Creatinine trending in the right direction. Appreciate Medicine team's assistance.   , PA-C Orthopedic Surgery [phone] 03/11/2021, 9:13 AM

## 2021-03-12 LAB — BASIC METABOLIC PANEL
Anion gap: 8 (ref 5–15)
BUN: 21 mg/dL — ABNORMAL HIGH (ref 6–20)
CO2: 27 mmol/L (ref 22–32)
Calcium: 9.4 mg/dL (ref 8.9–10.3)
Chloride: 97 mmol/L — ABNORMAL LOW (ref 98–111)
Creatinine, Ser: 1.37 mg/dL — ABNORMAL HIGH (ref 0.61–1.24)
GFR, Estimated: >60 mL/min (ref >60)
Glucose, Bld: 127 mg/dL — ABNORMAL HIGH (ref 70–99)
Potassium: 3.6 mmol/L (ref 3.5–5.1)
Sodium: 132 mmol/L — ABNORMAL LOW (ref 135–145)

## 2021-03-12 LAB — GLUCOSE, CAPILLARY
Glucose-Capillary: 124 mg/dL — ABNORMAL HIGH (ref 70–99)
Glucose-Capillary: 117 mg/dL — ABNORMAL HIGH (ref 70–99)
Glucose-Capillary: 157 mg/dL — ABNORMAL HIGH (ref 70–99)
Glucose-Capillary: 120 mg/dL — ABNORMAL HIGH (ref 70–99)

## 2021-03-12 NOTE — Progress Notes (Signed)
PROGRESS NOTE    Connie Hilgert  SJG:[id number] DOB: Oct 05, 1970 DOA: 03/02/2021 [facility]CP: [facility]   Brief Narrative/Hospital Course: [name], 50 y.o. male with PMH of diabetes with left foot infection and S/P arthrodesis in July 2020 presented with nonunion of left foot hardware underwent arthrodesis of left foot 12/8 and remain hospitalized under orthopedic surgery team.  He was noted to have creatinine of 2.3:12/14 compared to preop 1.3 and hospitalist was consulted.  Antihypertensive discontinued placed on IV fluids with improvement in renal function.   Subjective: Alert awake this morning mildly anxious. No acute events overnight.  Resting comfortably.   Drinking well creatinine improved  Assessment & Plan:  Acute kidney injury: Baseline creatinine 1.2-1.3, had bump in creatinine up to 2.3 due to soft blood pressure/hypotension, dehydration, given IV fluid, and discontinued,amlodipine chlorthalidone and lisinopril-with improvement in the blood pressure.  Now off IV fluids encourage oral intake, continue Coreg at 6.25 mg twice daily and continue to hold diastolic blood pressure medication.Renal ultrasound reassuring.  Hypertension: BP overall well controlled 160s last night, on Coreg 6.25 mg.  Home amlodipine chlorthalidone and lisinopril has been discontinued due to AKI and hypotension and will continue to hold.  If blood pressure persistently above 160s we will resume amlodipine at 5 mg, I will follow during his hospitalization.   Hypovolemic Hyponatremia-stable. 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 : Will benefit with PCP follow-up, weight loss  healthy lifestyle and outpatient sleep evaluation.  DVT prophylaxis: enoxaparin (LOVENOX) injection 40 mg Start: 03/03/21 0800 SCDs Start: 03/02/21 1253 Code Status:   Code Status: Full Code Family Communication: plan of care discussed with patient at bedside. Status is: Inpatient Remains inpatient appropriate because: Pending placement Disposition: Currently is medically stable for discharge. Anticipated Disposition: SNF   Objective: Vitals last 24 hrs: Vitals:   03/11/21 1457 03/11/21 2029 03/12/21 0256 03/12/21 0825  BP: 134/88 (!) 169/100 (!) 138/99 (!) 147/95  Pulse: 85 77 72 87  Resp: 15 19 18 16   Temp: 98.5 F (36.9 C) 98.1 F (36.7 C) 97.8 F (36.6 C) (!) 97.1 F (36.2 C)  TempSrc: Oral Oral Oral   SpO2: 100% 100% 100% 100%  Weight:      Height:       Weight change:   Intake/Output Summary (Last 24 hours) at 03/12/2021 0926 Last data filed at 03/12/2021 0256 Gross per 24 hour  Intake 720 ml  Output 2150 ml  Net -1430 ml   Net IO Since Admission: -13,765.37 mL [03/12/21 0926]   Physical Examination: General exam: AAOx 3 older than stated age, weak appearing. HEENT:Oral mucosa moist, Ear/Nose WNL grossly, dentition normal. Respiratory system: bilaterally diminished, , no use of accessory muscle Cardiovascular system: S1 & S2 +, No JVD,. Gastrointestinal system: Abdomen soft, NT,ND, BS+ Nervous System:Alert, awake, moving extremities and grossly nonfocal Extremities: Left foot with dressing in place, distal peripheral pulses palpable.  Skin: No rashes,no icterus. MSK: Normal muscle bulk,tone, power   Medications reviewed:  Scheduled Meds:  atorvastatin  40 mg Oral Daily   carvedilol  6.25 mg Oral BID WC   docusate sodium  100 mg Oral BID    enoxaparin (LOVENOX) injection  40 mg Subcutaneous Q24H   insulin aspart  0-9 Units Subcutaneous TID WC   magnesium oxide  400 mg Oral Daily   pantoprazole  40 mg Oral Daily   potassium chloride  40 mEq Oral Once   senna  1 tablet Oral BID   Continuous Infusions:  methocarbamol (ROBAXIN) IV      Diet Order             Diet Carb Modified Fluid consistency: Thin; Room service appropriate? Yes  Diet effective now                 Weight change:   Wt Readings from Last 3 Encounters:  03/02/21 125.2 kg  09/14/20 129.3 kg  11/25/19 (!) 137 kg     Consultants:see note  Procedures:see note Antimicrobials: Anti-infectives (From admission, onward)    Start     Dose/Rate Route Frequency Ordered Stop   03/02/21 0933  vancomycin (VANCOCIN) powder  Status:  Discontinued          As needed 03/02/21 0933 03/02/21 1047   03/02/21 0800  ceFAZolin (ANCEF) IVPB 3g/100 mL premix        3 g 200 mL/hr over 30 Minutes Intravenous On call to O.R. 03/02/21 0745 03/02/21 0921      Culture/Microbiology    Component Value Date/Time   SDES  11/25/2019 0347    BLOOD LEFT ANTECUBITAL Performed at [facility], [address] [place], [place] [address]    SPECREQUEST  11/25/2019 [phone]    BOTTLES DRAWN AEROBIC AND ANAEROBIC Blood Culture adequate volume Performed at [facility], [address] [place], [place] [address]    CULT  11/25/2019 [phone]    NO GROWTH 5 DAYS Performed at [facility], [address], [place], [place] [address]    REPTSTATUS 11/30/2019 FINAL 11/25/2019 6063    Other culture-see note  Unresulted Labs (From admission, onward)     Start     Ordered   03/09/21 0500  Creatinine, serum  (enoxaparin (LOVENOX)    CrCl >/= 30 ml/min)  Weekly,   R     Comments: while on enoxaparin therapy    03/02/21 1252          Data Reviewed: I have personally reviewed following labs and imaging studies CBC: No results for input(s): WBC, NEUTROABS, HGB,  HCT, MCV, PLT in the last 168 hours. Basic Metabolic Panel: Recent Labs  Lab 03/09/21 0553 03/10/21 0058 03/11/21 0304 03/12/21 0231  NA  --  131* 133* 132*  K  --  3.3* 3.5 3.6  CL  --  98 99 97*  CO2  --  23 28 27   GLUCOSE  --  99 112* 127*  BUN  --  40* 24* 21*  CREATININE 2.36* 1.79* 1.43* 1.37*  CALCIUM  --  8.9 8.9 9.4   GFR: Estimated Creatinine Clearance: 88.7 mL/min (A) (by C-G formula based on SCr of 1.37 mg/dL (H)). Liver Function Tests: No results for input(s): AST, ALT, ALKPHOS, BILITOT, PROT, ALBUMIN in the last 168 hours. No results for input(s): LIPASE, AMYLASE in the last 168 hours. No results for input(s): AMMONIA in the last 168 hours. Coagulation Profile: No results for input(s): INR, PROTIME in the last 168 hours. Cardiac Enzymes: No results for input(s): CKTOTAL,  CKMB, CKMBINDEX, TROPONINI in the last 168 hours. BNP (last 3 results) No results for input(s): PROBNP in the last 8760 hours. HbA1C: No results for input(s): HGBA1C in the last 72 hours. CBG: Recent Labs  Lab 03/10/21 2213 03/11/21 0825 03/11/21 1136 03/11/21 1601 03/11/21 2033  GLUCAP 125* 128* 118* 114* 113*   Lipid Profile: No results for input(s): CHOL, HDL, LDLCALC, TRIG, CHOLHDL, LDLDIRECT in the last 72 hours. Thyroid Function Tests: No results for input(s): TSH, T4TOTAL, FREET4, T3FREE, THYROIDAB in the last 72 hours. Anemia Panel: No results for input(s): VITAMINB12, FOLATE, FERRITIN, TIBC, IRON, RETICCTPCT in the last 72 hours. Sepsis Labs: No results for input(s): PROCALCITON, LATICACIDVEN in the last 168 hours.  No results found for this or any previous visit (from the past 240 hour(s)).   Radiology Studies: No results found.   LOS: 10 days   Lanae Boast, MD [facility]  03/12/2021, 9:26 AM

## 2021-03-12 NOTE — Progress Notes (Signed)
Mobility Specialist Progress Note    03/12/21 1752  Mobility  Activity Ambulated in hall  Level of Assistance Contact guard assist, steadying assist  Assistive Device Front wheel walker  LLE Weight Bearing NWB  Distance Ambulated (ft) 300 ft  Mobility Ambulated with assistance in hallway  Mobility Response Tolerated well  Mobility performed by Mobility specialist  $Mobility charge 1 Mobility   Pt received in bed and agreeable. No complaints on the walk. Returned to bed with call bell in reach.   [facility] Mobility Specialist  M.S. Primary Phone: [phone] M.S. Secondary Phone: [phone]

## 2021-03-12 NOTE — Progress Notes (Addendum)
° °  Subjective: 10 Days Post-Op Procedure(s) (LRB): Revision left subtalar arthrodesis (Left) Patient reports pain as mild.   Patient seen in rounds for Dr. Victorino Dike. Patient is well, and has had no acute complaints or problems. No acute events overnight. Patient ambulated 200 feet with PT. He is understandably impatient about being in the hospital so long, and ready to be discharged. We will continue therapy today.   Objective: Vital signs in last 24 hours: Temp:  [97.1 F (36.2 C)-98.5 F (36.9 C)] 97.1 F (36.2 C) (12/18 0825) Pulse Rate:  [72-87] 87 (12/18 0825) Resp:  [15-19] 16 (12/18 0825) BP: (134-169)/(88-100) 147/95 (12/18 0825) SpO2:  [100 %] 100 % (12/18 0825)  Intake/Output from previous day:  Intake/Output Summary (Last 24 hours) at 03/12/2021 0832 Last data filed at 03/12/2021 0256 Gross per 24 hour  Intake 720 ml  Output 2150 ml  Net -1430 ml     Intake/Output this shift: No intake/output data recorded.  Labs: No results for input(s): HGB in the last 72 hours. No results for input(s): WBC, RBC, HCT, PLT in the last 72 hours. Recent Labs    03/11/21 0304 03/12/21 0231  NA 133* 132*  K 3.5 3.6  CL 99 97*  CO2 28 27  BUN 24* 21*  CREATININE 1.43* 1.37*  GLUCOSE 112* 127*  CALCIUM 8.9 9.4   No results for input(s): LABPT, INR in the last 72 hours.  Exam: General - Patient is Alert and Oriented Extremity - Neurologically intact Sensation intact distally Good cap refill Dressing - dressing C/D/I Motor Function - intact, moving toes well on exam.   Past Medical History:  Diagnosis Date   Acid reflux    Diabetes mellitus without complication (HCC)    Type II   Hypertension    Mixed hyperlipidemia    Sleep apnea    No cpap    Assessment/Plan: 10 Days Post-Op Procedure(s) (LRB): Revision left subtalar arthrodesis (Left) Principal Problem:   Nonunion of subtalar arthrodesis  Estimated body mass index is 35.44 kg/m as calculated from the  following:   Height as of this encounter: 6\' 2"  (1.88 m).   Weight as of this encounter: 125.2 kg. Advance diet Up with therapy   NWB L LE Up with therapy Ready for D/C to SNF once bed available. DVT ppx:  Lovenox inhouse; transition to Xarelto upon D/C. D/C scripts on chart. Creatinine continues to trend in the right direction. Appreciate Medicine team's assistance  , [facility] [phone] 03/12/2021, 8:32 AM

## 2021-03-12 NOTE — Progress Notes (Signed)
Mobility Specialist Progress Note    03/12/21 1448  Mobility  Activity Ambulated in hall  Level of Assistance Contact guard assist, steadying assist  Assistive Device Front wheel walker  LLE Weight Bearing NWB  Distance Ambulated (ft) 170 ft  Mobility Ambulated with assistance in hallway  Mobility Response Tolerated well  Mobility performed by Mobility specialist  $Mobility charge 1 Mobility   Pt received in bed and agreeable. No complaints on walk. Returned to bed with call bell in reach. Will f/u this pm as schedule permits.   [facility] Mobility Specialist  M.S. Primary Phone: [phone] M.S. Secondary Phone: [phone]

## 2021-03-13 LAB — GLUCOSE, CAPILLARY
Glucose-Capillary: 140 mg/dL — ABNORMAL HIGH (ref 70–99)
Glucose-Capillary: 146 mg/dL — ABNORMAL HIGH (ref 70–99)
Glucose-Capillary: 134 mg/dL — ABNORMAL HIGH (ref 70–99)
Glucose-Capillary: 152 mg/dL — ABNORMAL HIGH (ref 70–99)

## 2021-03-13 MED ORDER — AMLODIPINE BESYLATE 5 MG PO TABS: 5.0000 mg | ORAL_TABLET | Freq: Every day | ORAL | Status: DC

## 2021-03-13 MED ORDER — AMLODIPINE BESYLATE 5 MG PO TABS
5.0000 mg | ORAL_TABLET | Freq: Every day | ORAL | Status: DC
  Administered 2021-03-13 – 2021-03-14 (×2): 5 mg via ORAL
  Filled 2021-03-13 (×2): qty 1

## 2021-03-13 NOTE — Progress Notes (Signed)
PROGRESS NOTE    Cody [name]  PTW:[id number] DOB: 1970/05/28 DOA: 03/02/2021 PCP: [facility]   Brief Narrative/Hospital Course: Cody [name], 50 y.o. male with PMH of diabetes with left foot infection and S/P arthrodesis in July 2020 presented with nonunion of left foot hardware underwent arthrodesis of left foot 12/8 and remain hospitalized under orthopedic surgery team.  He was noted to have creatinine of 2.3:12/14 compared to preop 1.3 and hospitalist was consulted.  Antihypertensive discontinued placed on IV fluids with improvement in renal function.   Subjective:  Appears somewhat upset with nursing staff.  Complains of pain not in distress Assessment & Plan:  Acute kidney injury: Baseline creatinine 1.2-1.3, had bump in creatinine up to 2.3 due to soft blood pressure/hypotension, dehydration, given IV fluid, and discontinued,amlodipine chlorthalidone and lisinopril-with improvement in the blood pressure.  Now off IV fluids encourage oral intake, continue Coreg at 6.25 mg twice daily, starting amlodipine 5 mg today, monitor creatinine intermittentlyRenal ultrasound reassuring. Recent Labs  Lab 03/09/21 0553 03/10/21 0058 03/11/21 0304 03/12/21 0231  BUN  --  40* 24* 21*  CREATININE 2.36* 1.79* 1.43* 1.37*     Hypertension: BP uptrending resume amlodipine at 5 mg normally on 10 mg, continue on Coreg 6.25.  cont to hold chlorthalidone and lisinopril.  Completed med for discharge to help the orthopedic team    Hypovolemic Hyponatremia-stable. 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 : Will benefit with PCP follow-up, weight loss  healthy lifestyle and outpatient sleep evaluation.  DVT prophylaxis: enoxaparin (LOVENOX) injection 40 mg Start: 03/03/21 0800 SCDs Start: 03/02/21 1253 Code Status:   Code Status: Full Code Family Communication: plan of care discussed with patient at bedside. Status is: Inpatient Remains inpatient appropriate because: Pending placement Disposition: Currently is medically stable for discharge. Anticipated Disposition: SNF   Objective: Vitals last 24 hrs: Vitals:   03/12/21 0825 03/12/21 1948 03/13/21 0323 03/13/21 0741  BP: (!) 147/95 (!) 149/100 (!) 147/98 (!) 119/96  Pulse: 87 89 88 72  Resp: 16 19 18 18   Temp: (!) 97.1 F (36.2 C) 97.8 F (36.6 C) 97.8 F (36.6 C) 97.7 F (36.5 C)  TempSrc: Oral Oral Oral Oral  SpO2: 100% 100% 100% 100%  Weight:      Height:       Weight change:   Intake/Output Summary (Last 24 hours) at 03/13/2021 1200 Last data filed at 03/13/2021 0745 Gross per 24 hour  Intake --  Output 850 ml  Net -850 ml    Net IO Since Admission: -16,365.37 mL [03/13/21 1200]   Physical Examination: General exam: AAOx 3, appears upset older than stated age, weak appearing. HEENT:Oral mucosa moist, Ear/Nose WNL grossly, dentition normal. Respiratory system: bilaterally diminished, , no use of accessory muscle Cardiovascular system: S1 & S2 +, No JVD,. Gastrointestinal system: Abdomen soft, NT,ND, BS+ Nervous System:Alert, awake, moving extremities and grossly nonfocal Extremities: Left foot in dressing Skin: No rashes,no icterus. MSK: Normal muscle bulk,tone, power   Medications reviewed:  Scheduled Meds:  amLODipine  5 mg Oral Daily   atorvastatin  40 mg Oral Daily   carvedilol  6.25 mg Oral BID WC   docusate sodium  100 mg Oral BID    enoxaparin (LOVENOX) injection  40 mg Subcutaneous Q24H   insulin aspart  0-9 Units Subcutaneous TID WC   magnesium oxide  400 mg Oral Daily   pantoprazole  40 mg Oral Daily   potassium chloride  40 mEq Oral Once   senna  1 tablet Oral BID   Continuous Infusions:  methocarbamol (ROBAXIN) IV      Diet Order             Diet Carb Modified Fluid consistency: Thin; Room service appropriate? Yes  Diet effective now                 Weight change:   Wt Readings from Last 3 Encounters:  03/02/21 125.2 kg  09/14/20 129.3 kg  11/25/19 (!) 137 kg     Consultants:see note  Procedures:see note Antimicrobials: Anti-infectives (From admission, onward)    Start     Dose/Rate Route Frequency Ordered Stop   03/02/21 0933  vancomycin (VANCOCIN) powder  Status:  Discontinued          As needed 03/02/21 0933 03/02/21 1047   03/02/21 0800  ceFAZolin (ANCEF) IVPB 3g/100 mL premix        3 g 200 mL/hr over 30 Minutes Intravenous On call to O.R. 03/02/21 0745 03/02/21 0921      Culture/Microbiology    Component Value Date/Time   SDES  11/25/2019 4034    BLOOD LEFT ANTECUBITAL Performed at [facility], [address], [place], [place] [address]    SPECREQUEST  11/25/2019 [phone]    BOTTLES DRAWN AEROBIC AND ANAEROBIC Blood Culture adequate volume Performed at [facility], [address] [place], [place] [address]    CULT  11/25/2019 [phone]    NO GROWTH 5 DAYS Performed at [facility], [address] [place], [place] [address]    REPTSTATUS 11/30/2019 FINAL 11/25/2019 6606    Other culture-see note  Unresulted Labs (From admission, onward)     Start     Ordered   03/09/21 0500  Creatinine, serum  (enoxaparin (LOVENOX)    CrCl >/= 30 ml/min)  Weekly,   R     Comments: while on enoxaparin therapy    03/02/21 1252          Data Reviewed: I have personally reviewed following labs and imaging studies CBC: No results for input(s): WBC, NEUTROABS, HGB,  HCT, MCV, PLT in the last 168 hours. Basic Metabolic Panel: Recent Labs  Lab 03/09/21 0553 03/10/21 0058 03/11/21 0304 03/12/21 0231  NA  --  131* 133* 132*  K  --  3.3* 3.5 3.6  CL  --  98 99 97*  CO2  --  23 28 27   GLUCOSE  --  99 112* 127*  BUN  --  40* 24* 21*  CREATININE 2.36* 1.79* 1.43* 1.37*  CALCIUM  --  8.9 8.9 9.4    GFR: Estimated Creatinine Clearance: 88.7 mL/min (A) (by C-G formula based on SCr of 1.37 mg/dL (H)). Liver Function Tests: No results for input(s): AST, ALT, ALKPHOS, BILITOT, PROT, ALBUMIN in the last 168 hours. No results for input(s): LIPASE, AMYLASE in the last 168 hours. No results for input(s): AMMONIA in the last 168 hours. Coagulation Profile: No results for input(s): INR, PROTIME in  the last 168 hours. Cardiac Enzymes: No results for input(s): CKTOTAL, CKMB, CKMBINDEX, TROPONINI in the last 168 hours. BNP (last 3 results) No results for input(s): PROBNP in the last 8760 hours. HbA1C: No results for input(s): HGBA1C in the last 72 hours. CBG: Recent Labs  Lab 03/12/21 0931 03/12/21 1306 03/12/21 1644 03/12/21 2245 03/13/21 0442  GLUCAP 124* 117* 157* 120* 140*    Lipid Profile: No results for input(s): CHOL, HDL, LDLCALC, TRIG, CHOLHDL, LDLDIRECT in the last 72 hours. Thyroid Function Tests: No results for input(s): TSH, T4TOTAL, FREET4, T3FREE, THYROIDAB in the last 72 hours. Anemia Panel: No results for input(s): VITAMINB12, FOLATE, FERRITIN, TIBC, IRON, RETICCTPCT in the last 72 hours. Sepsis Labs: No results for input(s): PROCALCITON, LATICACIDVEN in the last 168 hours.  No results found for this or any previous visit (from the past 240 hour(s)).   Radiology Studies: No results found.   LOS: 11 days   Lanae Boast, MD [facility]  03/13/2021, 12:00 PM

## 2021-03-13 NOTE — Progress Notes (Signed)
Subjective: 11 Days Post-Op Procedure(s) (LRB): Revision left subtalar arthrodesis (Left)  Patient reports pain as mild to moderate.  Patient resting comfortably in bed.  Tolerating POs well.  Admits to flatus.  Notes that he has worked well with therapy over the weekend.  States that he is ready for D/C.  Objective:   VITALS:  Temp:  [97.1 F (36.2 C)-97.8 F (36.6 C)] 97.8 F (36.6 C) (12/19 0323) Pulse Rate:  [87-89] 88 (12/19 0323) Resp:  [16-19] 18 (12/19 0323) BP: (147-149)/(95-100) 147/98 (12/19 0323) SpO2:  [100 %] 100 % (12/19 0323)  General: WDWN patient in NAD. Psych:  Appropriate mood and affect. Neuro:  A&O x 3, Moving all extremities, sensation intact to light touch HEENT:  EOMs intact Chest:  Even non-labored respirations Skin:  SLS C/D/I, no rashes or lesions Extremities: warm/dry, no visible edema, erythema or echymosis.  No lymphadenopathy. Pulses: Popliteus 2+ MSK:  ROM: EHL/FHL intact, MMT: able to perform quad set   LABS No results for input(s): HGB, WBC, PLT in the last 72 hours. Recent Labs    03/11/21 0304 03/12/21 0231  NA 133* 132*  K 3.5 3.6  CL 99 97*  CO2 28 27  BUN 24* 21*  CREATININE 1.43* 1.37*  GLUCOSE 112* 127*   No results for input(s): LABPT, INR in the last 72 hours.   Assessment/Plan: 11 Days Post-Op Procedure(s) (LRB): Revision left subtalar arthrodesis (Left)  NWB L LE Up with therapy Cr trending in the right direction.  Appreciate Medicine team's assistance. D/C scripts on chart. D/C when bed available.  Alfredo Martinez PA-C [facility] Office:  [phone]

## 2021-03-13 NOTE — TOC Progression Note (Signed)
Transition of Care [facility]) - Progression Note    Patient Details  Name: Cody [name] MRN: [id number] Date of Birth: Oct 08, 1970  Transition of Care [facility]) CM/SW Contact  Erin Sons, [place] Phone Number: 03/13/2021, 2:04 PM  Clinical Narrative:     CSW received call from Okey Regal at [facility]; she confirmed that they have negotiated a payment plan with pt's workman's comp and can accept pt tomorrow. CSW notified pt who is agreeable. Plan for pt to DC tomorrow. Covid test requested.   Expected Discharge Plan: Skilled Nursing Facility Barriers to Discharge: Continued Medical Work up, SNF Pending bed offer, Other (must enter comment) (Worker's comp payer)  Expected Discharge Plan and Services Expected Discharge Plan: Skilled Nursing Facility In-house Referral: Clinical Social Work Discharge Planning Services: CM Consult Post Acute Care Choice: Skilled Nursing Facility Living arrangements for the past 2 months: Homeless                                       Social Determinants of Health (SDOH) Interventions    Readmission Risk Interventions No flowsheet data found.

## 2021-03-13 NOTE — Discharge Summary (Addendum)
Physician Discharge Summary  Patient ID: Cody [name] MRN: [id number] DOB/AGE: Oct 08, 1970 50 y.o.  Admit date: 03/02/2021 Discharge date: 03/17/2021  Admission Diagnoses: Left subtalar joint nonunion; hx of cellulitis R leg, AKI, thrombocytopenia, HTN, GERD, DM type II, cellulitis of L foot.  Discharge Diagnoses:  Principal Problem:   Nonunion of subtalar arthrodesis As stated above  Discharged Condition: stable  Hospital Course: Patient presented to [facility] OR on 03/02/21 for elective left foot hardware removal and revision subtalar joint arthrodesis.  He tolerated the procedure well without complication.  He was then admitted to the hospital.  He did have elevated serum creatinine during his stay.  The hospitalist was consulted.  He was given fluids and hypertensive medications were managed until SCr returned to close to baseline levels.  He will have another BMP obtained at the SNF in a week.  The original split was taken down, sutures removed, steri-strips applied, and new NWB SLC applied on 03/16/21.  The patient tolerated his stay well.  He worked well with therapy.  He is to be D/C'd to SNF on 03/17/21.  Consults:  [facility]  Significant Diagnostic Studies: BMP to monitor SCr and radiology: X-Ray: to ensure satisfactory anatomic alignment during operative procedure.  Treatments: IV hydration, antibiotics: Ancef, analgesia: acetaminophen, Dilaudid, and oxycodone, cardiac meds: carvedilol and amlodipine, anticoagulation: lovenox, insulin: Humalog, and surgery: As stated above.  Discharge Exam: Blood pressure (!) 140/105, pulse 65, temperature 97.6 F (36.4 C), temperature source Oral, resp. rate 18, height 6\' 2"  (1.88 m), weight 125.2 kg, SpO2 100 %. General: WDWN patient in NAD. Psych:  Appropriate mood and affect. Neuro:  A&O x 3, Moving all extremities, sensation intact to light touch HEENT:  EOMs intact Chest:  Even non-labored respirations Skin:  SLC C/D/I, no rashes  or lesions Extremities: warm/dry, mild edema to L ankle and hindfoot, no erythema or echymosis.  No lymphadenopathy. Pulses: Popliteus 2+ MSK:  ROM: EHL/FHL intact, MMT: able to perform quad set  Disposition: Discharge disposition: 03-Skilled Nursing Facility       Discharge Instructions     Call MD / Call 911   Complete by: As directed    If you experience chest pain or shortness of breath, CALL 911 and be transported to the hospital emergency room.  If you develope a fever above 101 F, pus (white drainage) or increased drainage or redness at the wound, or calf pain, call your surgeon's office.   Call MD / Call 911   Complete by: As directed    If you experience chest pain or shortness of breath, CALL 911 and be transported to the hospital emergency room.  If you develope a fever above 101 F, pus (white drainage) or increased drainage or redness at the wound, or calf pain, call your surgeon's office.   Constipation Prevention   Complete by: As directed    Drink plenty of fluids.  Prune juice may be helpful.  You may use a stool softener, such as Colace (over the counter) 100 mg twice a day.  Use MiraLax (over the counter) for constipation as needed.   Constipation Prevention   Complete by: As directed    Drink plenty of fluids.  Prune juice may be helpful.  You may use a stool softener, such as Colace (over the counter) 100 mg twice a day.  Use MiraLax (over the counter) for constipation as needed.   Diet - low sodium heart healthy   Complete by: As directed  Diet - low sodium heart healthy   Complete by: As directed    Increase activity slowly as tolerated   Complete by: As directed    Increase activity slowly as tolerated   Complete by: As directed    Non weight bearing   Complete by: As directed    Laterality: left   Extremity: Lower   Non weight bearing   Complete by: As directed    Laterality: left   Extremity: Lower   Post-operative opioid taper instructions:    Complete by: As directed    POST-OPERATIVE OPIOID TAPER INSTRUCTIONS: It is important to wean off of your opioid medication as soon as possible. If you do not need pain medication after your surgery it is ok to stop day one. Opioids include: Codeine, Hydrocodone(Norco, Vicodin), Oxycodone(Percocet, oxycontin) and hydromorphone amongst others.  Long term and even short term use of opiods can cause: Increased pain response Dependence Constipation Depression Respiratory depression And more.  Withdrawal symptoms can include Flu like symptoms Nausea, vomiting And more Techniques to manage these symptoms Hydrate well Eat regular healthy meals Stay active Use relaxation techniques(deep breathing, meditating, yoga) Do Not substitute Alcohol to help with tapering If you have been on opioids for less than two weeks and do not have pain than it is ok to stop all together.  Plan to wean off of opioids This plan should start within one week post op of your joint replacement. Maintain the same interval or time between taking each dose and first decrease the dose.  Cut the total daily intake of opioids by one tablet each day Next start to increase the time between doses. The last dose that should be eliminated is the evening dose.      Post-operative opioid taper instructions:   Complete by: As directed    POST-OPERATIVE OPIOID TAPER INSTRUCTIONS: It is important to wean off of your opioid medication as soon as possible. If you do not need pain medication after your surgery it is ok to stop day one. Opioids include: Codeine, Hydrocodone(Norco, Vicodin), Oxycodone(Percocet, oxycontin) and hydromorphone amongst others.  Long term and even short term use of opiods can cause: Increased pain response Dependence Constipation Depression Respiratory depression And more.  Withdrawal symptoms can include Flu like symptoms Nausea, vomiting And more Techniques to manage these symptoms Hydrate  well Eat regular healthy meals Stay active Use relaxation techniques(deep breathing, meditating, yoga) Do Not substitute Alcohol to help with tapering If you have been on opioids for less than two weeks and do not have pain than it is ok to stop all together.  Plan to wean off of opioids This plan should start within one week post op of your joint replacement. Maintain the same interval or time between taking each dose and first decrease the dose.  Cut the total daily intake of opioids by one tablet each day Next start to increase the time between doses. The last dose that should be eliminated is the evening dose.         Allergies as of 03/17/2021   No Known Allergies      Medication List     STOP taking these medications    chlorthalidone 25 MG tablet Commonly known as: HYGROTON   lisinopril 40 MG tablet Commonly known as: ZESTRIL       TAKE these medications    amLODipine 10 MG tablet Commonly known as: NORVASC Take 10 mg by mouth daily.   atorvastatin 40 MG tablet Commonly known as:  LIPITOR Take 40 mg by mouth daily.   carvedilol 6.25 MG tablet Commonly known as: COREG Take 6.25 mg by mouth 2 (two) times daily with a meal.   docusate sodium 100 MG capsule Commonly known as: Colace Take 1 capsule (100 mg total) by mouth 2 (two) times daily. While taking narcotic pain medicine.   magnesium oxide 400 MG tablet Commonly known as: MAG-OX Take 400 mg by mouth daily.   metFORMIN 500 MG 24 hr tablet Commonly known as: GLUCOPHAGE-XR Take 1,000 mg by mouth 2 (two) times daily.   pantoprazole 40 MG tablet Commonly known as: PROTONIX Take 40 mg by mouth daily.   rivaroxaban 10 MG Tabs tablet Commonly known as: Xarelto Take 1 tablet (10 mg total) by mouth daily.   Rybelsus 7 MG Tabs Generic drug: Semaglutide Take 7 mg by mouth daily.   senna 8.6 MG Tabs tablet Commonly known as: SENOKOT Take 2 tablets (17.2 mg total) by mouth 2 (two) times daily.        ASK your doctor about these medications    oxyCODONE 5 MG immediate release tablet Commonly known as: Roxicodone Take 1 tablet (5 mg total) by mouth every 4 (four) hours as needed for up to 5 days. Ask about: Should I take this medication?               Discharge Care Instructions  (From admission, onward)           Start     Ordered   03/17/21 0000  Non weight bearing       Question Answer Comment  Laterality left   Extremity Lower      03/17/21 0725   03/13/21 0000  Non weight bearing       Question Answer Comment  Laterality left   Extremity Lower      03/13/21 1634            Follow-up Information     Toni Arthurs, MD. Schedule an appointment as soon as possible for a visit in 4 week(s).   Specialty: Orthopedic Surgery Contact information: [address] [place] [address] [place] [address] [phone]                 Signed: Lolly Mustache Office:  [phone]

## 2021-03-13 NOTE — Discharge Summary (Incomplete Revision)
Physician Discharge Summary  Patient ID: Cody [name] MRN: [id number] DOB/AGE: February 13, 1971 50 y.o.  Admit date: 03/02/2021 Discharge date: 03/15/2021  Admission Diagnoses: Left subtalar joint nonunion; hx of cellulitis R leg, AKI, thrombocytopenia, HTN, GERD, DM type II, cellulitis of L foot.  Discharge Diagnoses:  Principal Problem:   Nonunion of subtalar arthrodesis As stated above  Discharged Condition: stable  Hospital Course: Patient presented to [facility] OR on 03/02/21 for elective left foot hardware removal and revision subtalar joint arthrodesis.  He tolerated the procedure well without complication.  He was then admitted to the hospital.  He did have elevated serum creatinine during his stay.  The hospitalist was consulted.  He was given fluids and hypertensive medications were managed until SCr returned to baseline levels.  The patient tolerated his stay well.  He worked well with therapy.  He is to be D/C'd to SNF on 03/15/21.  Consults:  [facility]  Significant Diagnostic Studies: BMP to monitor SCr and radiology: X-Ray: to ensure satisfactory anatomic alignment during operative procedure.  Treatments: IV hydration, antibiotics: Ancef, analgesia: acetaminophen, Dilaudid, and oxycodone, cardiac meds: carvedilol and amlodipine, anticoagulation: lovenox, insulin: Humalog, and surgery: As stated above.  Discharge Exam: Blood pressure (!) 155/117, pulse 82, temperature 98.2 F (36.8 C), temperature source Oral, resp. rate 18, height 6\' 2"  (1.88 m), weight 125.2 kg, SpO2 98 %. General: WDWN patient in NAD. Psych:  Appropriate mood and affect. Neuro:  A&O x 3, Moving all extremities, sensation intact to light touch HEENT:  EOMs intact Chest:  Even non-labored respirations Skin:  SLS C/D/I, no rashes or lesions Extremities: warm/dry, no visible edema, erythema or echymosis.  No lymphadenopathy. Pulses: Popliteus 2+ MSK:  ROM: EHL/FHL intact, MMT: able to perform quad  set  Disposition: Discharge disposition: 03-Skilled Nursing Facility       Discharge Instructions     Call MD / Call 911   Complete by: As directed    If you experience chest pain or shortness of breath, CALL 911 and be transported to the hospital emergency room.  If you develope a fever above 101 F, pus (white drainage) or increased drainage or redness at the wound, or calf pain, call your surgeon's office.   Constipation Prevention   Complete by: As directed    Drink plenty of fluids.  Prune juice may be helpful.  You may use a stool softener, such as Colace (over the counter) 100 mg twice a day.  Use MiraLax (over the counter) for constipation as needed.   Diet - low sodium heart healthy   Complete by: As directed    Increase activity slowly as tolerated   Complete by: As directed    Non weight bearing   Complete by: As directed    Laterality: left   Extremity: Lower   Post-operative opioid taper instructions:   Complete by: As directed    POST-OPERATIVE OPIOID TAPER INSTRUCTIONS: It is important to wean off of your opioid medication as soon as possible. If you do not need pain medication after your surgery it is ok to stop day one. Opioids include: Codeine, Hydrocodone(Norco, Vicodin), Oxycodone(Percocet, oxycontin) and hydromorphone amongst others.  Long term and even short term use of opiods can cause: Increased pain response Dependence Constipation Depression Respiratory depression And more.  Withdrawal symptoms can include Flu like symptoms Nausea, vomiting And more Techniques to manage these symptoms Hydrate well Eat regular healthy meals Stay active Use relaxation techniques(deep breathing, meditating, yoga) Do Not substitute Alcohol to  help with tapering If you have been on opioids for less than two weeks and do not have pain than it is ok to stop all together.  Plan to wean off of opioids This plan should start within one week post op of your joint  replacement. Maintain the same interval or time between taking each dose and first decrease the dose.  Cut the total daily intake of opioids by one tablet each day Next start to increase the time between doses. The last dose that should be eliminated is the evening dose.         Allergies as of 03/13/2021   No Known Allergies      Medication List     STOP taking these medications    chlorthalidone 25 MG tablet Commonly known as: HYGROTON   lisinopril 40 MG tablet Commonly known as: ZESTRIL       TAKE these medications    amLODipine 5 MG tablet Commonly known as: NORVASC Take 1 tablet (5 mg total) by mouth daily. What changed:  medication strength how much to take   atorvastatin 40 MG tablet Commonly known as: LIPITOR Take 40 mg by mouth daily.   carvedilol 6.25 MG tablet Commonly known as: COREG Take 6.25 mg by mouth 2 (two) times daily with a meal.   docusate sodium 100 MG capsule Commonly known as: Colace Take 1 capsule (100 mg total) by mouth 2 (two) times daily. While taking narcotic pain medicine.   magnesium oxide 400 MG tablet Commonly known as: MAG-OX Take 400 mg by mouth daily.   metFORMIN 500 MG 24 hr tablet Commonly known as: GLUCOPHAGE-XR Take 1,000 mg by mouth 2 (two) times daily.   pantoprazole 40 MG tablet Commonly known as: PROTONIX Take 40 mg by mouth daily.   rivaroxaban 10 MG Tabs tablet Commonly known as: Xarelto Take 1 tablet (10 mg total) by mouth daily.   Rybelsus 7 MG Tabs Generic drug: Semaglutide Take 7 mg by mouth daily.   senna 8.6 MG Tabs tablet Commonly known as: SENOKOT Take 2 tablets (17.2 mg total) by mouth 2 (two) times daily.       ASK your doctor about these medications    oxyCODONE 5 MG immediate release tablet Commonly known as: Roxicodone Take 1 tablet (5 mg total) by mouth every 4 (four) hours as needed for up to 5 days. Ask about: Should I take this medication?               Discharge  Care Instructions  (From admission, onward)           Start     Ordered   03/13/21 0000  Non weight bearing       Question Answer Comment  Laterality left   Extremity Lower      03/13/21 1634            Follow-up Information     Toni Arthurs, MD. Schedule an appointment as soon as possible for a visit in 2 week(s).   Specialty: Orthopedic Surgery Contact information: [address] [place] [address] [phone]                 Signed: Lolly Mustache Office:  [phone]

## 2021-03-13 NOTE — Progress Notes (Signed)
Mobility Specialist Progress Note   03/13/21 1800  Mobility  Activity Ambulated in hall  Level of Assistance Standby assist, set-up cues, supervision of patient - no hands on  Assistive Device Front wheel walker  LLE Weight Bearing NWB  Distance Ambulated (ft) 385 ft  Mobility Ambulated with assistance in hallway  Mobility Response Tolerated well  Mobility performed by Mobility specialist  $Mobility charge 1 Mobility   Received pt in bed having no complaints and agreeable to mobility. Asymptomatic throughout ambulation, returned back to bed w/ call bell by side and all needs met.  Holland Falling Mobility Specialist Phone Number [phone]'

## 2021-03-13 NOTE — TOC Progression Note (Signed)
Transition of Care [facility]) - Progression Note    Patient Details  Name: Kiko Ripp MRN: [id number] Date of Birth: Jun 09, 1970  [facility]) CM/SW Contact  Erin Sons, [place] Phone Number: 03/13/2021, 10:54 AM  Clinical Narrative:     CSW called Okey Regal at [facility]; she explained that her supervisor is currently on the phone with pt's workman's comp discussing potential payment options. She takes CSW's contact info and will call back when there is an update.   Expected Discharge Plan: Skilled Nursing Facility Barriers to Discharge: Continued Medical Work up, SNF Pending bed offer, Other (must enter comment) (Worker's comp payer)  Expected Discharge Plan and Services Expected Discharge Plan: Skilled Nursing Facility In-house Referral: Clinical Social Work Discharge Planning Services: CM Consult Post Acute Care Choice: Skilled Nursing Facility Living arrangements for the past 2 months: Homeless                                       Social Determinants of Health (SDOH) Interventions    Readmission Risk Interventions No flowsheet data found.

## 2021-03-13 NOTE — Progress Notes (Signed)
Mobility Specialist Progress Note   03/13/21 1230  Mobility  Activity  (LE exercises)  Range of Motion/Exercises Right leg;Left leg  Level of Assistance Standby assist, set-up cues, supervision of patient - no hands on  Assistive Device None  LLE Weight Bearing NWB  Mobility Sit up in bed/chair position for meals  Mobility Response Tolerated well  Mobility performed by Mobility specialist  $Mobility charge 1 Mobility   Received pt in chair c/o tiredness but agreeable to mobility. LE exercise performed w/ limited strength and ROM in LLE but able to complete session. Left w/ needs met and call bell in reach.  Holland Falling Mobility Specialist Phone Number [phone]

## 2021-03-13 NOTE — Progress Notes (Signed)
OT Cancellation Note  Patient Details Name: [name] MRN: [id number] DOB: 04-18-1970   Cancelled Treatment:    Reason Eval/Treat Not Completed: Patient declined, he is "too upset and angry right now to do any therapy..I am staying in this bed today" OT spent over 45 min with patient listening to him, attempting to console and explain hospital to SNF process, and meet patients needs. Patient wanting to speak with director level staff. OT confirmed with Emergency planning/management officer and Acute Rehab Training and development officer that they would go speak with this patient about [facility] care while at [facility]. Pt did state that he was happy with his RN today Glee Arvin) and the mobility tech Jeri Modena). He is very anxious to get SNF placement. OT attempted multiple times for patient to participate in therapy and explaining the benefits, to which he declined. OT will continue to follow acutely.  Evern Bio Chrisma Hurlock 03/13/2021, 1:38 PM  Nyoka Cowden OTR/L Acute Rehabilitation Services Pager: [phone] Office: [phone]

## 2021-03-14 LAB — BASIC METABOLIC PANEL
Anion gap: 7 (ref 5–15)
BUN: 27 mg/dL — ABNORMAL HIGH (ref 6–20)
CO2: 28 mmol/L (ref 22–32)
Calcium: 9.1 mg/dL (ref 8.9–10.3)
Chloride: 100 mmol/L (ref 98–111)
Creatinine, Ser: 1.57 mg/dL — ABNORMAL HIGH (ref 0.61–1.24)
GFR, Estimated: 53 mL/min — ABNORMAL LOW (ref >60)
Glucose, Bld: 127 mg/dL — ABNORMAL HIGH (ref 70–99)
Potassium: 4 mmol/L (ref 3.5–5.1)
Sodium: 135 mmol/L (ref 135–145)

## 2021-03-14 LAB — GLUCOSE, CAPILLARY
Glucose-Capillary: 183 mg/dL — ABNORMAL HIGH (ref 70–99)
Glucose-Capillary: 125 mg/dL — ABNORMAL HIGH (ref 70–99)
Glucose-Capillary: 113 mg/dL — ABNORMAL HIGH (ref 70–99)
Glucose-Capillary: 105 mg/dL — ABNORMAL HIGH (ref 70–99)

## 2021-03-14 LAB — SARS CORONAVIRUS 2 (TAT 6-24 HRS): SARS Coronavirus 2: NEGATIVE

## 2021-03-14 MED ORDER — AMLODIPINE BESYLATE 10 MG PO TABS
10.0000 mg | ORAL_TABLET | Freq: Every day | ORAL | Status: DC
  Administered 2021-03-15 – 2021-03-17 (×3): 10 mg via ORAL
  Filled 2021-03-14 (×3): qty 1

## 2021-03-14 NOTE — TOC Progression Note (Addendum)
Transition of Care [facility]) - Progression Note    Patient Details  Name: Cody [name] MRN: [id number] Date of Birth: 1970-11-30  [facility]) CM/SW Contact  Erin Sons, [place] Phone Number: 03/14/2021, 12:07 PM  Clinical Narrative:     CSW notified by [facility] that while they came to an agreement with pt's workman's comp, they are still waiting on written documentation of the agreement which they have not received yet. They cannot accept patient until they receive this documentation.   1530: CSW spoke with Okey Regal at [facility]; she stated they still have not received signed copy of the payment plan they negotiated with workman's comp. They cannot accept pt until they receive that.   Expected Discharge Plan: Skilled Nursing Facility Barriers to Discharge: Continued Medical Work up, SNF Pending bed offer, Other (must enter comment) (Worker's comp payer)  Expected Discharge Plan and Services Expected Discharge Plan: Skilled Nursing Facility In-house Referral: Clinical Social Work Discharge Planning Services: CM Consult Post Acute Care Choice: Skilled Nursing Facility Living arrangements for the past 2 months: Homeless Expected Discharge Date: 03/14/21                                     Social Determinants of Health (SDOH) Interventions    Readmission Risk Interventions No flowsheet data found.

## 2021-03-14 NOTE — Plan of Care (Signed)
  Problem: Education: Goal: Knowledge of General Education information will improve Description: Including pain rating scale, medication(s)/side effects and non-pharmacologic comfort measures Outcome: Progressing   Problem: Health Behavior/Discharge Planning: Goal: Ability to manage health-related needs will improve Outcome: Progressing   Problem: Clinical Measurements: Goal: Ability to maintain clinical measurements within normal limits will improve Outcome: Progressing Goal: Will remain free from infection Outcome: Progressing   Problem: Activity: Goal: Risk for activity intolerance will decrease Outcome: Progressing   Problem: Nutrition: Goal: Adequate nutrition will be maintained Outcome: Progressing   Problem: Coping: Goal: Level of anxiety will decrease Outcome: Progressing   Problem: Elimination: Goal: Will not experience complications related to bowel motility Outcome: Progressing   Problem: Pain Managment: Goal: General experience of comfort will improve Outcome: Progressing   Problem: Safety: Goal: Ability to remain free from injury will improve Outcome: Progressing   

## 2021-03-14 NOTE — Progress Notes (Signed)
Subjective: 12 Days Post-Op Procedure(s) (LRB): Revision left subtalar arthrodesis (Left)  Notes reviewed.  Patient continues to work well with therapy.  He has bed placement but awaiting paperwork to be finalized.  Patient will not be D/C'd today.  Objective:   VITALS:  Temp:  [97.4 F (36.3 C)-98.5 F (36.9 C)] 97.7 F (36.5 C) (12/20 1316) Pulse Rate:  [71-89] 80 (12/20 1316) Resp:  [18-20] 18 (12/20 0756) BP: (136-150)/(86-104) 143/101 (12/20 1316) SpO2:  [100 %] 100 % (12/20 1316)     LABS No results for input(s): HGB, WBC, PLT in the last 72 hours. Recent Labs    03/12/21 0231 03/14/21 0406  NA 132* 135  K 3.6 4.0  CL 97* 100  CO2 27 28  BUN 21* 27*  CREATININE 1.37* 1.57*  GLUCOSE 127* 127*   No results for input(s): LABPT, INR in the last 72 hours.   Assessment/Plan: 12 Days Post-Op Procedure(s) (LRB): Revision left subtalar arthrodesis (Left)  D/C order for today cancelled. Anticipate D/C to SNF tomorrow. D/C scripts on chart. D/C summary pended. Please inform me if patient is ready to go to SNF tomorrow and I will place D/C order. Plan for outpatient post-op visit next week or the week after.  Alfredo Martinez PA-C [facility] Office:  [phone]

## 2021-03-14 NOTE — Progress Notes (Signed)
PROGRESS NOTE    Cody [name]  SHF:[id number] DOB: Oct 11, 1970 DOA: 03/02/2021 PCP: [facility]   Brief Narrative/Hospital Course: [name], 50 y.o. male with PMH of diabetes with left foot infection and S/P arthrodesis in July 2020 presented with nonunion of left foot hardware underwent arthrodesis of left foot 12/8 and remain hospitalized under orthopedic surgery team.  He was noted to have creatinine of 2.3:12/14 compared to preop 1.3 and hospitalist was consulted.  Antihypertensive discontinued placed on IV fluids with improvement in renal function.   Subjective: Seen and examined.  Resting comfortably appears calm not verbally aggressive today. Blood pressure trending up Assessment & Plan:  Acute kidney injury: Baseline creatinine 1.2-1.3, had bump in creatinine up to 2.3 due to soft blood pressure/hypotension, dehydration, given IV fluid, and discontinued,amlodipine chlorthalidone and lisinopril-with improvement in the renal function.  Creatinine slightly 1.5, blood pressure trending up resume amlodipine at 10 mg, med rec completed, continue Coreg 6.25 continue to hold chlorthalidone and lisinopril.  Encourage oral intake repeat BMP in 1 week at the facility Recent Labs  Lab 03/09/21 0553 03/10/21 0058 03/11/21 0304 03/12/21 0231 03/14/21 0406  BUN  --  40* 24* 21* 27*  CREATININE 2.36* 1.79* 1.43* 1.37* 1.57*    Hypertension: BP uptrending change amlodipine to 10 mg continue Coreg 6.25.  cont to hold chlorthalidone and lisinopril.  Completed med-rec for discharge personally for HTN meds.   Hypovolemic Hyponatremia-stable. Off ovf Hypokalemia resolved  Nonunion of subtalar arthrodesis:Left foot arthrodesis due to nonunion continue pain control PT OT DVT prophylaxis as per primary orthopedics team  Insulin-dependent diabetes mellitus: Well-controlled continue SSI, continue to hold metformin -resume upon discharge  Recent Labs  Lab 03/13/21 0442 03/13/21 1204  03/13/21 1730 03/13/21 2118 03/14/21 0609  GLUCAP 140* 146* 134* 152* 183*    Anxiety: Continue TID PRN Atarax   Deconditioning/debility continue PT OT awaiting for placement  Class II Obesity:Patient's Body mass index is 35.44 kg/m. : Will benefit with PCP follow-up, weight loss  healthy lifestyle and outpatient sleep evaluation.  DVT prophylaxis: enoxaparin (LOVENOX) injection 40 mg Start: 03/03/21 0800 SCDs Start: 03/02/21 1253 Code Status:   Code Status: Full Code Family Communication: plan of care discussed with patient at bedside. Status is: Inpatient Remains inpatient appropriate because: Pending placement Disposition: Currently is medically stable for discharge. Anticipated Disposition: SNF anticipating discharge today if approved by insurance   Objective: Vitals last 24 hrs: Vitals:   03/13/21 2116 03/14/21 0604 03/14/21 0607 03/14/21 0756  BP: (!) 142/104 (!) 136/91 (!) 146/86 (!) 150/101  Pulse: 89 82 80 71  Resp: 20 18 18 18   Temp: (!) 97.4 F (36.3 C) 97.7 F (36.5 C) (!) 97.5 F (36.4 C) 98.5 F (36.9 C)  TempSrc: Oral Oral Oral Oral  SpO2: 100% 100% 100% 100%  Weight:      Height:       Weight change:   Intake/Output Summary (Last 24 hours) at 03/14/2021 1028 Last data filed at 03/13/2021 1500 Gross per 24 hour  Intake --  Output 500 ml  Net -500 ml   Net IO Since Admission: -16,625.37 mL [03/14/21 1028]   Physical Examination: General exam: AAOx 3,calm older than stated age, weak appearing. HEENT:Oral mucosa moist, Ear/Nose WNL grossly, dentition normal. Respiratory system: bilaterally clear, no use of accessory muscle Cardiovascular system: S1 & S2 +, No JVD,. Gastrointestinal system: Abdomen soft, NT,ND, BS+ Nervous System:Alert, awake, moving extremities and grossly nonfocal Extremities: Left foot with dressing in place, toes  visible pink and intact sensation Skin: No rashes,no icterus. MSK: Normal muscle bulk,tone, power   Medications  reviewed:  Scheduled Meds:  [START ON 03/15/2021] amLODipine  10 mg Oral Daily   atorvastatin  40 mg Oral Daily   carvedilol  6.25 mg Oral BID WC   docusate sodium  100 mg Oral BID   enoxaparin (LOVENOX) injection  40 mg Subcutaneous Q24H   insulin aspart  0-9 Units Subcutaneous TID WC   magnesium oxide  400 mg Oral Daily   pantoprazole  40 mg Oral Daily   potassium chloride  40 mEq Oral Once   senna  1 tablet Oral BID   Continuous Infusions:  methocarbamol (ROBAXIN) IV      Diet Order             Diet - low sodium heart healthy           Diet Carb Modified Fluid consistency: Thin; Room service appropriate? Yes  Diet effective now                 Weight change:   Wt Readings from Last 3 Encounters:  03/02/21 125.2 kg  09/14/20 129.3 kg  11/25/19 (!) 137 kg     Consultants:see note  Procedures:see note Antimicrobials: Anti-infectives (From admission, onward)    Start     Dose/Rate Route Frequency Ordered Stop   03/02/21 0933  vancomycin (VANCOCIN) powder  Status:  Discontinued          As needed 03/02/21 0933 03/02/21 1047   03/02/21 0800  ceFAZolin (ANCEF) IVPB 3g/100 mL premix        3 g 200 mL/hr over 30 Minutes Intravenous On call to O.R. 03/02/21 0745 03/02/21 0921      Culture/Microbiology    Component Value Date/Time   SDES  11/25/2019 9242    BLOOD LEFT ANTECUBITAL Performed at [facility], [address], [place], [place] [address]    SPECREQUEST  11/25/2019 [phone]    BOTTLES DRAWN AEROBIC AND ANAEROBIC Blood Culture adequate volume Performed at [facility], [address] [place], [place] [address]    CULT  11/25/2019 [phone]    NO GROWTH 5 DAYS Performed at [facility], [address], [place], [place] [address]    REPTSTATUS 11/30/2019 FINAL 11/25/2019 0814    Other culture-see note  Unresulted Labs (From admission, onward)     Start     Ordered   03/09/21 0500  Creatinine, serum  (enoxaparin (LOVENOX)    CrCl  >/= 30 ml/min)  Weekly,   R     Comments: while on enoxaparin therapy    03/02/21 1252          Data Reviewed: I have personally reviewed following labs and imaging studies CBC: No results for input(s): WBC, NEUTROABS, HGB, HCT, MCV, PLT in the last 168 hours. Basic Metabolic Panel: Recent Labs  Lab 03/09/21 0553 03/10/21 0058 03/11/21 0304 03/12/21 0231 03/14/21 0406  NA  --  131* 133* 132* 135  K  --  3.3* 3.5 3.6 4.0  CL  --  98 99 97* 100  CO2  --  23 28 27 28   GLUCOSE  --  99 112* 127* 127*  BUN  --  40* 24* 21* 27*  CREATININE 2.36* 1.79* 1.43* 1.37* 1.57*  CALCIUM  --  8.9 8.9 9.4 9.1   GFR: Estimated Creatinine Clearance: 77.4 mL/min (A) (by C-G formula based on SCr of 1.57  mg/dL (H)). Liver Function Tests: No results for input(s): AST, ALT, ALKPHOS, BILITOT, PROT, ALBUMIN in the last 168 hours. No results for input(s): LIPASE, AMYLASE in the last 168 hours. No results for input(s): AMMONIA in the last 168 hours. Coagulation Profile: No results for input(s): INR, PROTIME in the last 168 hours. Cardiac Enzymes: No results for input(s): CKTOTAL, CKMB, CKMBINDEX, TROPONINI in the last 168 hours. BNP (last 3 results) No results for input(s): PROBNP in the last 8760 hours. HbA1C: No results for input(s): HGBA1C in the last 72 hours. CBG: Recent Labs  Lab 03/13/21 0442 03/13/21 1204 03/13/21 1730 03/13/21 2118 03/14/21 0609  GLUCAP 140* 146* 134* 152* 183*   Lipid Profile: No results for input(s): CHOL, HDL, LDLCALC, TRIG, CHOLHDL, LDLDIRECT in the last 72 hours. Thyroid Function Tests: No results for input(s): TSH, T4TOTAL, FREET4, T3FREE, THYROIDAB in the last 72 hours. Anemia Panel: No results for input(s): VITAMINB12, FOLATE, FERRITIN, TIBC, IRON, RETICCTPCT in the last 72 hours. Sepsis Labs: No results for input(s): PROCALCITON, LATICACIDVEN in the last 168 hours.  Recent Results (from the past 240 hour(s))  SARS CORONAVIRUS 2 (TAT 6-24 HRS)  Nasopharyngeal Nasopharyngeal Swab     Status: None   Collection Time: 03/13/21  2:17 PM   Specimen: Nasopharyngeal Swab  Result Value Ref Range Status   SARS Coronavirus 2 NEGATIVE NEGATIVE Final    Comment: (NOTE) SARS-CoV-2 target nucleic acids are NOT DETECTED.  The SARS-CoV-2 RNA is generally detectable in upper and lower respiratory specimens during the acute phase of infection. Negative results do not preclude SARS-CoV-2 infection, do not rule out co-infections with other pathogens, and should not be used as the sole basis for treatment or other patient management decisions. Negative results must be combined with clinical observations, patient history, and epidemiological information. The expected result is Negative.  Fact Sheet for Patients: HairSlick.no  Fact Sheet for Healthcare Providers: quierodirigir.com  This test is not yet approved or cleared by the [place] FDA and  has been authorized for detection and/or diagnosis of SARS-CoV-2 by FDA under an Emergency Use Authorization (EUA). This EUA will remain  in effect (meaning this test can be used) for the duration of the COVID-19 declaration under Se ction 564(b)(1) of the Act, 21 U.S.C. section 360bbb-3(b)(1), unless the authorization is terminated or revoked sooner.  Performed at [facility], [address], [place], [place] [address]      Radiology Studies: No results found.   LOS: 12 days   Lanae Boast, MD [facility]  03/14/2021, 10:28 AM

## 2021-03-14 NOTE — Progress Notes (Signed)
Occupational Therapy Treatment Patient Details Name: Cody [name] MRN: [id number] DOB: Mar 26, 1971 Today's Date: 03/14/2021   History of present illness Pt. 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  PMH: DM, HTN   OT comments  Pt very pleasant and eager to work with therapy today. Pt demonstrating good static standing maintaining NWB on LLE with focus on standing grooming ADL. For full details on ADL completion see below. Pt also motivated to ambulate with RW. Did a lap in the hallway, Pt declined bath "The NT is going to help me" OT POC remains appropriate at this time.    Recommendations for follow up therapy are one component of a multi-disciplinary discharge planning process, led by the attending physician.  Recommendations may be updated based on patient status, additional functional criteria and insurance authorization.    Follow Up Recommendations  Skilled nursing-short term rehab (<3 hours/day)    Assistance Recommended at Discharge Intermittent Supervision/Assistance  Equipment Recommendations  None recommended by OT    Recommendations for Other Services      Precautions / Restrictions Precautions Precautions: Fall Restrictions Weight Bearing Restrictions: Yes LLE Weight Bearing: Non weight bearing       Mobility Bed Mobility Overal bed mobility: Modified Independent             General bed mobility comments: Transfers in and OOB without assist.    Transfers Overall transfer level: Needs assistance Equipment used: Rolling walker (2 wheels) Transfers: Sit to/from Stand Sit to Stand: Supervision           General transfer comment: with intial stand, Pt had one LOB that required therapist to correct to prevent fall. from then on did not need any physical assist. good job  maintaining WB restrictions.      Balance Overall balance assessment: Mild deficits observed, not formally tested   Sitting balance-Leahy Scale: Normal       Standing balance-Leahy Scale: Fair (LOB x1) Standing balance comment: able to static stand and maintain NWB                           ADL either performed or assessed with clinical judgement   ADL Overall ADL's : Needs assistance/impaired Eating/Feeding: Independent;Bed level   Grooming: Wash/dry hands;Wash/dry face;Supervision/safety;Standing;Oral care Grooming Details (indicate cue type and reason): sink level         Upper Body Dressing : Set up;Sitting Upper Body Dressing Details (indicate cue type and reason): to don extra gown as robe Lower Body Dressing: Set up;Sitting/lateral leans Lower Body Dressing Details (indicate cue type and reason): to don shoe on RLE Toilet Transfer: Supervision/safety;Regular Toilet;Ambulation;Rolling walker (2 wheels) Toilet Transfer Details (indicate cue type and reason): with initial LOB that required therapist assist to correct         Functional mobility during ADLs: Supervision/safety;Rolling walker (2 wheels) General ADL Comments: good static standing balance while maintaining NWB for ADL    Extremity/Trunk Assessment Upper Extremity Assessment Upper Extremity Assessment: Overall WFL for tasks assessed            Vision       Perception     Praxis      Cognition Arousal/Alertness: Awake/alert Behavior During Therapy: WFL for tasks assessed/performed Overall Cognitive Status: Within Functional Limits for tasks assessed  Exercises     Shoulder Instructions       General Comments      Pertinent Vitals/ Pain       Pain Assessment: Faces Faces Pain Scale: Hurts a little bit Pain Location: L lower leg/ankle Pain Descriptors / Indicators: Dull Pain Intervention(s): Monitored during session;Repositioned;Ice applied;Other  (comment) (elevation)  Home Living                                          Prior Functioning/Environment              Frequency  Min 2X/week        Progress Toward Goals  OT Goals(current goals can now be found in the care plan section)  Progress towards OT goals: Progressing toward goals  Acute Rehab OT Goals Patient Stated Goal: to get to rehab OT Goal Formulation: With patient Time For Goal Achievement: 03/17/21 Potential to Achieve Goals: Good  Plan Discharge plan remains appropriate    Co-evaluation                 AM-PAC OT "6 Clicks" Daily Activity     Outcome Measure   Help from another person eating meals?: None Help from another person taking care of personal grooming?: None Help from another person toileting, which includes using toliet, bedpan, or urinal?: None Help from another person bathing (including washing, rinsing, drying)?: A Little Help from another person to put on and taking off regular upper body clothing?: None Help from another person to put on and taking off regular lower body clothing?: A Little 6 Click Score: 22    End of Session Equipment Utilized During Treatment: Gait belt;Rolling walker (2 wheels)  OT Visit Diagnosis: Pain Pain - Right/Left: Left Pain - part of body: Ankle and joints of foot   Activity Tolerance Patient tolerated treatment well   Patient Left in bed;with call bell/phone within reach   Nurse Communication Mobility status        Time: 7412-8786 OT Time Calculation (min): 35 min  Charges: OT General Charges $OT Visit: 1 Visit OT Treatments $Self Care/Home Management : 8-22 mins $Therapeutic Activity: 8-22 mins Nyoka Cowden OTR/L Acute Rehabilitation Services Pager: [phone] Office: [phone]   Evern Bio Carmon Brigandi 03/14/2021, 10:02 AM

## 2021-03-15 DIAGNOSIS — I10 Essential (primary) hypertension: Secondary | ICD-10-CM

## 2021-03-15 DIAGNOSIS — N179 Acute kidney failure, unspecified: Secondary | ICD-10-CM

## 2021-03-15 LAB — GLUCOSE, CAPILLARY
Glucose-Capillary: 138 mg/dL — ABNORMAL HIGH (ref 70–99)
Glucose-Capillary: 113 mg/dL — ABNORMAL HIGH (ref 70–99)
Glucose-Capillary: 202 mg/dL — ABNORMAL HIGH (ref 70–99)

## 2021-03-15 NOTE — Progress Notes (Signed)
PROGRESS NOTE    [name]  [name]:[id number] DOB: [birth year] DOA: 03/02/2021 PCP: [facility]   Brief Narrative/Hospital Course: Cody [name], 50 y.o. male with PMH of diabetes with left foot infection and S/P arthrodesis in July 2020 presented with nonunion of left foot hardware underwent arthrodesis of left foot 12/8 and remain hospitalized under orthopedic surgery team.  He was noted to have creatinine of 2.3:12/14 compared to preop 1.3 and hospitalist was consulted.  Antihypertensive discontinued placed on IV fluids with improvement in renal function.    Subjective: Patient seems to be resting.  Denies any pain issues.  Wants me to talk to his kidney doctor.  I have told him to communicate the contact information to the nurse so that they can pass it on to me.    Assessment & Plan:  Acute kidney injury: Baseline creatinine 1.2-1.3, had bump in creatinine up to 2.3 due to soft blood pressure/hypotension, dehydration, given IV fluid, and discontinued,amlodipine chlorthalidone and lisinopril-with improvement in the renal function.   Has had good urine output.  Chlorthalidone and lisinopril remains on hold. Plan was to repeat a basic metabolic panel at a skilled nursing facility in 1 week.   Avoid nephrotoxic agents.  Monitor urine output.  Essential hypertension: Amlodipine dose was increased.  Continue carvedilol.  Holding chlorthalidone and lisinopril due to acute kidney injury as discussed above.     Hypovolemic Hyponatremia Stable.  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  As per primary orthopedics team  Diabetes mellitus type 2, controlled Resume metformin at discharge.  CBGs are reasonably well controlled here in the hospital.  Anxiety Continue TID PRN Atarax   Deconditioning/debility Continue PT OT awaiting for placement  Class II Obesity Patient's Body  mass index is 35.44 kg/m. : Will benefit with PCP follow-up, weight loss  healthy lifestyle and outpatient sleep evaluation.    Objective: Vitals last 24 hrs: Vitals:   03/14/21 0756 03/14/21 1316 03/14/21 1910 03/14/21 2134  BP: (!) 150/101 (!) 143/101  (!) 143/102  Pulse: 71 80  93  Resp: 18   11  Temp: 98.5 F (36.9 C) 97.7 F (36.5 C)  98.2 F (36.8 C)  TempSrc: Oral Oral  Oral  SpO2: 100% 100% 100% 100%  Weight:      Height:       Weight change:   Intake/Output Summary (Last 24 hours) at 03/15/2021 0941 Last data filed at 03/15/2021 0900 Gross per 24 hour  Intake 0 ml  Output 3025 ml  Net -3025 ml    Net IO Since Admission: -19,650.37 mL [03/15/21 0941]   Physical Examination:  General appearance: Awake alert.  In no distress Resp: Clear to auscultation bilaterally.  Normal effort Cardio: S1-S2 is normal regular.  No S3-S4.  No rubs murmurs or bruit GI: Abdomen is soft.  Nontender nondistended.  Bowel sounds are present normal.  No masses organomegaly     Medications reviewed:  Scheduled Meds:  amLODipine  10 mg Oral Daily   atorvastatin  40 mg Oral Daily   carvedilol  6.25 mg Oral BID WC   docusate sodium  100 mg Oral BID   enoxaparin (LOVENOX) injection  40 mg Subcutaneous Q24H   insulin aspart  0-9 Units Subcutaneous TID WC   magnesium oxide  400 mg Oral Daily   pantoprazole  40 mg Oral Daily   potassium chloride  40 mEq Oral Once  senna  1 tablet Oral BID   Continuous Infusions:  methocarbamol (ROBAXIN) IV      Diet Order             Diet - low sodium heart healthy           Diet Carb Modified Fluid consistency: Thin; Room service appropriate? Yes  Diet effective now                 Weight change:   Wt Readings from Last 3 Encounters:  03/02/21 125.2 kg  09/14/20 129.3 kg  11/25/19 (!) 137 kg     Consultants:see note  Procedures:see note  Antimicrobials: Anti-infectives (From admission, onward)    Start     Dose/Rate Route  Frequency Ordered Stop   03/02/21 0933  vancomycin (VANCOCIN) powder  Status:  Discontinued          As needed 03/02/21 0933 03/02/21 1047   03/02/21 0800  ceFAZolin (ANCEF) IVPB 3g/100 mL premix        3 g 200 mL/hr over 30 Minutes Intravenous On call to O.R. 03/02/21 0745 03/02/21 0921      Culture/Microbiology    Component Value Date/Time   SDES  11/25/2019 5465    BLOOD LEFT ANTECUBITAL Performed at [facility], [address], [place], [place] [address]    SPECREQUEST  11/25/2019 [id number]    BOTTLES DRAWN AEROBIC AND ANAEROBIC Blood Culture adequate volume Performed at [facility], [address] [place], [place] [address]    CULT  11/25/2019 [phone]    NO GROWTH 5 DAYS Performed at [facility], [address] [place], [place] [address]    REPTSTATUS 11/30/2019 FINAL 11/25/2019 3846    Other culture-see note  Unresulted Labs (From admission, onward)     Start     Ordered   03/09/21 0500  Creatinine, serum  (enoxaparin (LOVENOX)    CrCl >/= 30 ml/min)  Weekly,   R     Comments: while on enoxaparin therapy    03/02/21 1252           Data Reviewed: I have personally reviewed following labs and imaging studies  CBC: No results for input(s): WBC, NEUTROABS, HGB, HCT, MCV, PLT in the last 168 hours.  Basic Metabolic Panel: Recent Labs  Lab 03/09/21 0553 03/10/21 0058 03/11/21 0304 03/12/21 0231 03/14/21 0406  NA  --  131* 133* 132* 135  K  --  3.3* 3.5 3.6 4.0  CL  --  98 99 97* 100  CO2  --  23 28 27 28   GLUCOSE  --  99 112* 127* 127*  BUN  --  40* 24* 21* 27*  CREATININE 2.36* 1.79* 1.43* 1.37* 1.57*  CALCIUM  --  8.9 8.9 9.4 9.1    GFR: Estimated Creatinine Clearance: 77.4 mL/min (A) (by C-G formula based on SCr of 1.57 mg/dL (H)).  CBG: Recent Labs  Lab 03/14/21 0609 03/14/21 1146 03/14/21 1711 03/14/21 2133 03/15/21 0739  GLUCAP 183* 125* 113* 105* 138*      Recent Results (from the past 240 hour(s))  SARS  CORONAVIRUS 2 (TAT 6-24 HRS) Nasopharyngeal Nasopharyngeal Swab     Status: None   Collection Time: 03/13/21  2:17 PM   Specimen: Nasopharyngeal Swab  Result Value Ref Range Status   SARS Coronavirus 2 NEGATIVE NEGATIVE Final    Comment: (NOTE) SARS-CoV-2 target nucleic acids are NOT DETECTED.  The SARS-CoV-2 RNA is generally detectable in  upper and lower respiratory specimens during the acute phase of infection. Negative results do not preclude SARS-CoV-2 infection, do not rule out co-infections with other pathogens, and should not be used as the sole basis for treatment or other patient management decisions. Negative results must be combined with clinical observations, patient history, and epidemiological information. The expected result is Negative.  Fact Sheet for Patients: HairSlick.no  Fact Sheet for Healthcare Providers: quierodirigir.com  This test is not yet approved or cleared by the [place] FDA and  has been authorized for detection and/or diagnosis of SARS-CoV-2 by FDA under an Emergency Use Authorization (EUA). This EUA will remain  in effect (meaning this test can be used) for the duration of the COVID-19 declaration under Se ction 564(b)(1) of the Act, 21 U.S.C. section 360bbb-3(b)(1), unless the authorization is terminated or revoked sooner.  Performed at [facility], [address] [place], [place] [address]       Radiology Studies: No results found.   LOS: 13 days   Osvaldo Shipper, MD [facility]  03/15/2021, 9:41 AM

## 2021-03-15 NOTE — Progress Notes (Signed)
Subjective: 13 Days Post-Op Procedure(s) (LRB): Revision left subtalar arthrodesis (Left)  Called and spoke to patient.  He informs me that he is doing well.  Denies fever, chills, N/V, CP, SOB.  Tolerating POs well.  Admits to BM.  Anxious to be D/C'd to SNF.    Objective:   VITALS:  Temp:  [98.2 F (36.8 C)] 98.2 F (36.8 C) (12/20 2134) Pulse Rate:  [93] 93 (12/20 2134) Resp:  [11] 11 (12/20 2134) BP: (143)/(102) 143/102 (12/20 2134) SpO2:  [100 %] 100 % (12/20 2134)     LABS No results for input(s): HGB, WBC, PLT in the last 72 hours. Recent Labs    03/14/21 0406  NA 135  K 4.0  CL 100  CO2 28  BUN 27*  CREATININE 1.57*  GLUCOSE 127*   No results for input(s): LABPT, INR in the last 72 hours.   Assessment/Plan: 13 Days Post-Op Procedure(s) (LRB): Revision left subtalar arthrodesis (Left)  NWB L LE Up with therapy Notes from hospitalist and SW reviewed. I will plan to see the patient in the morning.  Anticipate suture removal and transition to a NWB SLC. D/C to SNF when bed available. D/C scripts on chart.  Alfredo Martinez PA-C [facility] Office:  [phone]

## 2021-03-15 NOTE — Progress Notes (Signed)
Mobility Specialist Progress Note   03/15/21 1030  Mobility  Activity Ambulated in hall  Level of Assistance Contact guard assist, steadying assist  LLE Weight Bearing NWB  Distance Ambulated (ft) 210 ft  Mobility Ambulated with assistance in hallway  Mobility Response Tolerated well  Mobility performed by Mobility specialist  $Mobility charge 1 Mobility   Received pt in bed upset about treatment received last night. Told pt I'd speak to staff and then proceeded w/ mobility. Asx throughout, returned back to bed w/ call bell in reach and needs met.   Holland Falling Mobility Specialist Phone Number [phone]

## 2021-03-15 NOTE — TOC Progression Note (Addendum)
Transition of Care [facility]) - Progression Note    Patient Details  Name: Cody [name] MRN: [id number] Date of Birth: December 08, 1970  Transition of [facility]) CM/SW Contact  Lorri Frederick, LCSW Phone Number: 03/15/2021, 11:10 AM  Clinical Narrative:   CSW messaged Carol at [facility] do not have the paperwork for the [employer] yet.   1100:  CSW LM with [employer]/worker's comp asking about this paperwork.    1330: message from Carol/[employer].  They still have not received paperwork.    1515: TC Megan Leia Alf, attorney.  The contract has not yet been signed by the adjustor.  There will be a court hearing tomorrow to discuss this.  Megan asked for updated notes from OT, PT, MD.  Upon further review, PT signed off 12/12, however OT continues to see pt and recommend SNF.  Megan aware of the need for NWB status as the primary reason for SNF admission.      Expected Discharge Plan: Skilled Nursing Facility Barriers to Discharge: Continued Medical Work up, SNF Pending bed offer, Other (must enter comment) (Worker's comp payer)  Expected Discharge Plan and Services Expected Discharge Plan: Skilled Nursing Facility In-house Referral: Clinical Social Work Discharge Planning Services: CM Consult Post Acute Care Choice: Skilled Nursing Facility Living arrangements for the past 2 months: Homeless Expected Discharge Date: 03/14/21                                     Social Determinants of Health (SDOH) Interventions    Readmission Risk Interventions No flowsheet data found.

## 2021-03-16 LAB — GLUCOSE, CAPILLARY
Glucose-Capillary: 119 mg/dL — ABNORMAL HIGH (ref 70–99)
Glucose-Capillary: 107 mg/dL — ABNORMAL HIGH (ref 70–99)
Glucose-Capillary: 127 mg/dL — ABNORMAL HIGH (ref 70–99)
Glucose-Capillary: 106 mg/dL — ABNORMAL HIGH (ref 70–99)

## 2021-03-16 LAB — CREATININE, SERUM
Creatinine, Ser: 1.42 mg/dL — ABNORMAL HIGH (ref 0.61–1.24)
GFR, Estimated: 59 mL/min — ABNORMAL LOW (ref >60)

## 2021-03-16 NOTE — Plan of Care (Signed)

## 2021-03-16 NOTE — Progress Notes (Signed)
During shift change, pt verbalized to this RN that he became drowsy after receiving pain medicine. Pt stated 'I want to make sure that I am not 'drugged up' or didn't receive something else that made me drowsy and sleepy. I am going to tell this to my doctor. I just can't wait to get out of here." Pt has been requesting IV dilaudid 1 mg and oxycodone 15 mg p.o alternately.  Pt seen walking around his room and was up to the bathroom.

## 2021-03-16 NOTE — Progress Notes (Signed)
Occupational Therapy Treatment Patient Details Name: Cody [name] MRN: [id number] DOB: 01/01/1971 Today's Date: 03/16/2021   History of present illness Pt. 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  PMH: DM, HTN   OT comments  Pt making good progress with functional goals. Session focused on using RW to walk ton bathroom, toilet transfers, toileting tasks, grooming tasks standing at sink, LB ADLs sit - stand safely and LB ADL compensatory and safety techniques. OT will continue to follow acutely to maximize level of function and safety   Recommendations for follow up therapy are one component of a multi-disciplinary discharge planning process, led by the attending physician.  Recommendations may be updated based on patient status, additional functional criteria and insurance authorization.    Follow Up Recommendations  Skilled nursing-short term rehab (<3 hours/day)    Assistance Recommended at Discharge Intermittent Supervision/Assistance  Equipment Recommendations  None recommended by OT    Recommendations for Other Services      Precautions / Restrictions Precautions Precautions: Fall Restrictions Weight Bearing Restrictions: Yes LLE Weight Bearing: Non weight bearing       Mobility Bed Mobility Overal bed mobility: Modified Independent             General bed mobility comments: Transfers in and OOB without assist.    Transfers Overall transfer level: Needs assistance Equipment used: Rolling walker (2 wheels) Transfers: Sit to/from Stand Sit to Stand: Supervision Stand pivot transfers: Supervision Step pivot transfers: Supervision             Balance Overall balance assessment: Mild deficits observed, not formally tested   Sitting balance-Leahy Scale: Normal     Standing balance support: Reliant on  assistive device for balance;During functional activity;Single extremity supported;Bilateral upper extremity supported Standing balance-Leahy Scale: Fair                             ADL either performed or assessed with clinical judgement   ADL Overall ADL's : Needs assistance/impaired     Grooming: Wash/dry hands;Wash/dry face;Supervision/safety;Standing       Lower Body Bathing: Min guard;Sitting/lateral leans       Lower Body Dressing: Minimal assistance;Sit to/from stand   Toilet Transfer: Retail banker;Ambulation;Rolling walker (2 wheels)   Toileting- Clothing Manipulation and Hygiene: Supervision/safety;Sit to/from stand       Functional mobility during ADLs: Supervision/safety;Rolling walker (2 wheels) General ADL Comments: good static standing balance while maintaining NWB for ADL. Reviewed LB ADL safety    Extremity/Trunk Assessment Upper Extremity Assessment Upper Extremity Assessment: Overall WFL for tasks assessed   Lower Extremity Assessment Lower Extremity Assessment: Defer to PT evaluation   Cervical / Trunk Assessment Cervical / Trunk Assessment: Normal    Vision Baseline Vision/History: 1 Wears glasses Ability to See in Adequate Light: 0 Adequate Patient Visual Report: No change from baseline     Perception     Praxis      Cognition Arousal/Alertness: Awake/alert Behavior During Therapy: WFL for tasks assessed/performed Overall Cognitive Status: Within Functional Limits for tasks assessed                                            Exercises  Shoulder Instructions       General Comments      Pertinent Vitals/ Pain       Pain Assessment: 0-10 Pain Score: 8  Breathing: normal Negative Vocalization: none Facial Expression: smiling or inexpressive Body Language: relaxed Consolability: no need to console PAINAD Score: 0 Pain Location: L lower leg/ankle Pain Descriptors /  Indicators: Shooting;Throbbing Pain Intervention(s): Monitored during session;Repositioned;Premedicated before session  Home Living                                          Prior Functioning/Environment              Frequency  Min 2X/week        Progress Toward Goals  OT Goals(current goals can now be found in the care plan section)  Progress towards OT goals: Progressing toward goals     Plan Discharge plan remains appropriate;Frequency remains appropriate    Co-evaluation                 AM-PAC OT "6 Clicks" Daily Activity     Outcome Measure   Help from another person eating meals?: None Help from another person taking care of personal grooming?: A Little Help from another person toileting, which includes using toliet, bedpan, or urinal?: None Help from another person bathing (including washing, rinsing, drying)?: A Little Help from another person to put on and taking off regular upper body clothing?: None Help from another person to put on and taking off regular lower body clothing?: A Little 6 Click Score: 21    End of Session Equipment Utilized During Treatment: Gait belt;Rolling walker (2 wheels);Other (comment) (3 in 1 over toilet)  OT Visit Diagnosis: Other abnormalities of gait and mobility (R26.89);Pain Pain - Right/Left: Left Pain - part of body: Ankle and joints of foot   Activity Tolerance Patient tolerated treatment well   Patient Left in bed;with call bell/phone within reach;Other (comment) (sitting EOB)   Nurse Communication          Time: 7353-2992 OT Time Calculation (min): 23 min  Charges: OT General Charges $OT Visit: 1 Visit OT Treatments $Self Care/Home Management : 8-22 mins $Therapeutic Activity: 8-22 mins   Galen Manila 03/16/2021, 2:08 PM

## 2021-03-16 NOTE — Progress Notes (Signed)
Subjective: 14 Days Post-Op Procedure(s) (LRB): Revision left subtalar arthrodesis (Left)  Patient reports pain as mild to moderate.  Tolerating POs well.  Admits to BM.  Reports that he received a call from his attorney and that he should be D/C'd to SNF today.  Objective:   VITALS:  Temp:  [98 F (36.7 C)] 98 F (36.7 C) (12/22 0731) Pulse Rate:  [75-78] 75 (12/22 0731) Resp:  [16-18] 18 (12/22 0731) BP: (139-154)/(83-101) 154/101 (12/22 0731) SpO2:  [100 %] 100 % (12/22 0731)  General: WDWN patient in NAD. Psych:  Appropriate mood and affect. Neuro:  A&O x 3, Moving all extremities, sensation intact to light touch HEENT:  EOMs intact Chest:  Even non-labored respirations Skin:  Incision and sutures C/D/I, no rashes or lesions Extremities: warm/dry, mild edema to lateral ankle and hindfoot, no erythema or echymosis.  No lymphadenopathy. Pulses: Popliteus 2+ MSK:  ROM: EHL/FHL intact, MMT: able to perform quad set   LABS No results for input(s): HGB, WBC, PLT in the last 72 hours. Recent Labs    03/14/21 0406 03/16/21 0617  NA 135  --   K 4.0  --   CL 100  --   CO2 28  --   BUN 27*  --   CREATININE 1.57* 1.42*  GLUCOSE 127*  --    No results for input(s): LABPT, INR in the last 72 hours.   Assessment/Plan: 14 Days Post-Op Procedure(s) (LRB): Revision left subtalar arthrodesis (Left)  Today the splint was taken down and sutures were removed.  Steri-strips were applied.   A new NWB SLC was applied NWB L LE Up with therapy DVT ppx:  Xarelto upon D/C Disp:  SNF, awaiting to hear to place order Plan to see the patient in outpatient setting in 4 weeks.  Alfredo Martinez PA-C [facility] Office:  [phone]

## 2021-03-16 NOTE — Progress Notes (Signed)
Orthopedic Tech Progress Note Patient Details:  Cody [name] [birth year] [id number]  Ortho Devices Type of Ortho Device: Cotton web roll, [place]inette, Other (comment) Ortho Device/Splint Location: LLE Ortho Device/Splint Interventions: Ordered, Application   Post Interventions Patient Tolerated: Well Instructions Provided: Care of device Short leg cast applied after MD removed old splint and sutures.  Darleen Crocker 03/16/2021, 11:28 AM

## 2021-03-16 NOTE — Progress Notes (Signed)
PROGRESS NOTE    Cody [name]  DEY:[id number] DOB: 11/28/1970 DOA: 03/02/2021 PCP: [facility]   Brief Narrative/Hospital Course: Cody [name], 50 y.o. male with PMH of diabetes with left foot infection and S/P arthrodesis in July 2020 presented with nonunion of left foot hardware underwent arthrodesis of left foot 12/8 and remain hospitalized under orthopedic surgery team.  He was noted to have creatinine of 2.3:12/14 compared to preop 1.3 and hospitalist was consulted.  Antihypertensive discontinued placed on IV fluids with improvement in renal function.    Subjective: Patient mentions that he is feeling well.  Has been urinating without difficulties.  Denies any significant pain in his left lower extremity although mobility is restricted.  No shortness of breath.  Assessment & Plan:  Acute kidney injury Baseline creatinine 1.2-1.3.  Creatinine increased to 2.3.  Likely due to combination of hypotension medications.  ACE inhibitor and chlorthalidone was discontinued.  He was given IV fluids. Has good urine output.  Creatinine is now almost back to baseline. Will recommend that a basic metabolic panel be checked at skilled nursing facility in 1 week.  Avoid nephrotoxic agents.  Essential hypertension Amlodipine dose was increased.  Continue carvedilol.  Holding chlorthalidone and lisinopril due to acute kidney injury as discussed above.     Hypovolemic Hyponatremia Stable.  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  As per primary orthopedics team  Diabetes mellitus type 2, controlled Resume metformin at discharge.  CBGs are reasonably well controlled here in the hospital.  Anxiety Continue TID PRN Atarax   Class II Obesity Patient's Body mass index is 35.44 kg/m. : Will benefit with PCP follow-up, weight loss  healthy lifestyle and outpatient sleep  evaluation.    Objective: Vitals last 24 hrs: Vitals:   03/14/21 1910 03/14/21 2134 03/15/21 2200 03/16/21 0731  BP:  (!) 143/102 139/83 (!) 154/101  Pulse:  93 78 75  Resp:  11 16 18   Temp:  98.2 F (36.8 C) 98 F (36.7 C) 98 F (36.7 C)  TempSrc:  Oral Oral Oral  SpO2: 100% 100% 100% 100%  Weight:      Height:       Weight change:   Intake/Output Summary (Last 24 hours) at 03/16/2021 1129 Last data filed at 03/16/2021 0900 Gross per 24 hour  Intake 360 ml  Output 2850 ml  Net -2490 ml    Net IO Since Admission: -22,140.37 mL [03/16/21 1129]   Physical Examination:  General appearance: Awake alert.  In no distress Resp: Clear to auscultation bilaterally.  Normal effort Cardio: S1-S2 is normal regular.  No S3-S4.  No rubs murmurs or bruit GI: Abdomen is soft.  Nontender nondistended.  Bowel sounds are present normal.  No masses organomegaly     Medications reviewed:  Scheduled Meds:  amLODipine  10 mg Oral Daily   atorvastatin  40 mg Oral Daily   carvedilol  6.25 mg Oral BID WC   docusate sodium  100 mg Oral BID   enoxaparin (LOVENOX) injection  40 mg Subcutaneous Q24H   insulin aspart  0-9 Units Subcutaneous TID WC   magnesium oxide  400 mg Oral Daily   pantoprazole  40 mg Oral Daily   potassium chloride  40 mEq Oral Once   senna  1 tablet Oral BID   Continuous Infusions:  methocarbamol (ROBAXIN) IV      Diet Order  Diet - low sodium heart healthy           Diet Carb Modified Fluid consistency: Thin; Room service appropriate? Yes  Diet effective now                 Weight change:   Wt Readings from Last 3 Encounters:  03/02/21 125.2 kg  09/14/20 129.3 kg  11/25/19 (!) 137 kg     Consultants:see note  Procedures:see note  Antimicrobials: Anti-infectives (From admission, onward)    Start     Dose/Rate Route Frequency Ordered Stop   03/02/21 0933  vancomycin (VANCOCIN) powder  Status:  Discontinued          As needed  03/02/21 0933 03/02/21 1047   03/02/21 0800  ceFAZolin (ANCEF) IVPB 3g/100 mL premix        3 g 200 mL/hr over 30 Minutes Intravenous On call to O.R. 03/02/21 0745 03/02/21 0921      Culture/Microbiology    Component Value Date/Time   SDES  11/25/2019 6294    BLOOD LEFT ANTECUBITAL Performed at [facility], [address], [place], [place] [address]    SPECREQUEST  11/25/2019 [phone]    BOTTLES DRAWN AEROBIC AND ANAEROBIC Blood Culture adequate volume Performed at [facility], [address] [place], [place] [address]    CULT  11/25/2019 [phone]    NO GROWTH 5 DAYS Performed at [facility], [address], [place], [place] [address]    REPTSTATUS 11/30/2019 FINAL 11/25/2019 7494    Other culture-see note  Unresulted Labs (From admission, onward)     Start     Ordered   03/09/21 0500  Creatinine, serum  (enoxaparin (LOVENOX)    CrCl >/= 30 ml/min)  Weekly,   R     Comments: while on enoxaparin therapy    03/02/21 1252           Data Reviewed: I have personally reviewed following labs and imaging studies  CBC: No results for input(s): WBC, NEUTROABS, HGB, HCT, MCV, PLT in the last 168 hours.  Basic Metabolic Panel: Recent Labs  Lab 03/10/21 0058 03/11/21 0304 03/12/21 0231 03/14/21 0406 03/16/21 0617  NA 131* 133* 132* 135  --   K 3.3* 3.5 3.6 4.0  --   CL 98 99 97* 100  --   CO2 23 28 27 28   --   GLUCOSE 99 112* 127* 127*  --   BUN 40* 24* 21* 27*  --   CREATININE 1.79* 1.43* 1.37* 1.57* 1.42*  CALCIUM 8.9 8.9 9.4 9.1  --     GFR: Estimated Creatinine Clearance: 85.6 mL/min (A) (by C-G formula based on SCr of 1.42 mg/dL (H)).  CBG: Recent Labs  Lab 03/14/21 2133 03/15/21 0739 03/15/21 1140 03/15/21 2202 03/16/21 0714  GLUCAP 105* 138* 113* 202* 119*      Recent Results (from the past 240 hour(s))  SARS CORONAVIRUS 2 (TAT 6-24 HRS) Nasopharyngeal Nasopharyngeal Swab     Status: None   Collection Time: 03/13/21  2:17 PM    Specimen: Nasopharyngeal Swab  Result Value Ref Range Status   SARS Coronavirus 2 NEGATIVE NEGATIVE Final    Comment: (NOTE) SARS-CoV-2 target nucleic acids are NOT DETECTED.  The SARS-CoV-2 RNA is generally detectable in upper and lower respiratory specimens during the acute phase of infection. Negative results do not preclude SARS-CoV-2 infection, do not rule out co-infections with other pathogens, and should not be used as the  sole basis for treatment or other patient management decisions. Negative results must be combined with clinical observations, patient history, and epidemiological information. The expected result is Negative.  Fact Sheet for Patients: HairSlick.no  Fact Sheet for Healthcare Providers: quierodirigir.com  This test is not yet approved or cleared by the [place] FDA and  has been authorized for detection and/or diagnosis of SARS-CoV-2 by FDA under an Emergency Use Authorization (EUA). This EUA will remain  in effect (meaning this test can be used) for the duration of the COVID-19 declaration under Se ction 564(b)(1) of the Act, 21 U.S.C. section 360bbb-3(b)(1), unless the authorization is terminated or revoked sooner.  Performed at [facility], [address], [place], [place] [address]       Radiology Studies: No results found.   LOS: 14 days   Osvaldo Shipper, MD [facility]  03/16/2021, 11:29 AM

## 2021-03-16 NOTE — Progress Notes (Signed)
Mobility Specialist Progress Note   03/16/21 1145  Mobility  Activity Ambulated in hall  Level of Assistance Contact guard assist, steadying assist  Assistive Device Front wheel walker  LLE Weight Bearing NWB  Distance Ambulated (ft) 226 ft  Mobility Ambulated with assistance in hallway  Mobility Response Tolerated well  Mobility performed by Mobility specialist  $Mobility charge 1 Mobility   Received pt in bed having no complaints and agreeable to mobility. Asymptomatic throughout ambulation, returned back to bed w/ call bell by side and all needs met.  Holland Falling Mobility Specialist Phone Number [phone]

## 2021-03-16 NOTE — Plan of Care (Signed)

## 2021-03-16 NOTE — TOC Progression Note (Addendum)
Transition of Care [facility]) - Progression Note    Patient Details  Name: Cody [name] MRN: [id number] Date of Birth: 08/20/1970  Transition of [facility]) CM/SW Contact  Lorri Frederick, LCSW Phone Number: 03/16/2021, 2:44 PM  Clinical Narrative:  CSW has been in touch with Rena from [employer] (worker's comp side), Aundra Millet (pt attorney) and Okey Regal at [employer] multiple times during the day trying to get the signed contract to [facility] so pt can discharge.  As of 1415, [facility] has received the contract, however there are several changes that need to be made per Print [employer] planner.  Okey Regal is communicating with Rena regarding this.  Once [facility] has accepted the contract, pt can transfer there immediately.     1605: TC [facility].  The contract situation is now resolved and they can accept pt.  Will need new covid test.  Will plan on admit tomorrow morning.      Expected Discharge Plan: Skilled Nursing Facility Barriers to Discharge: Continued Medical Work up, SNF Pending bed offer, Other (must enter comment) (Worker's comp payer)  Expected Discharge Plan and Services Expected Discharge Plan: Skilled Nursing Facility In-house Referral: Clinical Social Work Discharge Planning Services: CM Consult Post Acute Care Choice: Skilled Nursing Facility Living arrangements for the past 2 months: Homeless Expected Discharge Date: 03/14/21                                     Social Determinants of Health (SDOH) Interventions    Readmission Risk Interventions No flowsheet data found.

## 2021-03-17 LAB — SARS CORONAVIRUS 2 (TAT 6-24 HRS): SARS Coronavirus 2: NEGATIVE

## 2021-03-17 LAB — GLUCOSE, CAPILLARY: Glucose-Capillary: 130 mg/dL — ABNORMAL HIGH (ref 70–99)

## 2021-03-17 NOTE — Progress Notes (Signed)
Report called to Roxi, LPN. [facility] provided transportation. IV removed, AVS placed in packet. Patient confirmed he had all of his belongings

## 2021-03-17 NOTE — TOC Transition Note (Signed)
Transition of Care [facility]) - CM/SW Discharge Note   Patient Details  Name: Nichols Corter MRN: [id number] Date of Birth: [birth year]  Transition of Care [facility]) CM/SW Contact:  Carley Hammed, LCSWA Phone Number: 03/17/2021, 10:02 AM   Clinical Narrative:    Pt to be transported to [facility] via [facility].  Nurse to call report to [phone]. Rm 211B.   Final next level of care: Skilled Nursing Facility Barriers to Discharge: Barriers Resolved   Patient Goals and CMS Choice Patient states their goals for this hospitalization and ongoing recovery are:: 100% CMS Medicare.gov Compare Post Acute Care list provided to:: Patient Choice offered to / list presented to : Patient  Discharge Placement              Patient chooses bed at: [facility] Patient to be transferred to facility by: [facility] Name of family member notified: Patient Patient and family notified of of transfer: 03/17/21  Discharge Plan and Services In-house Referral: Clinical Social Work Discharge Planning Services: [employer] Consult Post Acute Care Choice: Skilled Nursing Facility                               Social Determinants of Health (SDOH) Interventions     Readmission Risk Interventions No flowsheet data found.

## 2021-03-17 NOTE — Progress Notes (Signed)
Subjective: 15 Days Post-Op Procedure(s) (LRB): Revision left subtalar arthrodesis (Left)  Patient reports pain as mild to moderate.  Eating breakfast and resting comfortably in bed.  Informs me that he is to be D/C'd to SNF this morning.  Objective:   VITALS:  Temp:  [97.6 F (36.4 C)-98 F (36.7 C)] 97.6 F (36.4 C) (12/23 0511) Pulse Rate:  [65-89] 65 (12/23 0511) Resp:  [18-19] 18 (12/23 0511) BP: (138-158)/(95-105) 140/105 (12/23 0511) SpO2:  [100 %] 100 % (12/23 0511)  General: WDWN patient in NAD. Psych:  Appropriate mood and affect. Neuro:  A&O x 3, Moving all extremities, sensation intact to light touch HEENT:  EOMs intact Chest:  Even non-labored respirations Skin:  SLC C/D/I, no rashes or lesions Extremities: warm/dry, no visible edema, erythema or echymosis.  No lymphadenopathy. Pulses: Popliteus 2+ MSK:  ROM: EHL/FHL intact, MMT: able to perform quad set   LABS No results for input(s): HGB, WBC, PLT in the last 72 hours. Recent Labs    03/16/21 0617  CREATININE 1.42*   No results for input(s): LABPT, INR in the last 72 hours.   Assessment/Plan: 15 Days Post-Op Procedure(s) (LRB): Revision left subtalar arthrodesis (Left)  Strict NWB L LE D/C to SNF D/C order and summary complete. D/C scripts on chart. Plan to follow up in outpatient setting in 1 month for 6 week post-op visit.  Alfredo Martinez PA-C [facility] Office:  [phone]

## 2021-03-17 NOTE — Progress Notes (Signed)
Mobility Specialist Criteria Algorithm Info.   03/17/21 0930  Mobility  Activity Ambulated in hall;Dangled on edge of bed  Range of Motion/Exercises Active;All extremities  Level of Assistance Contact guard; steady assist  Assistive Device Front wheel walker  LLE Weight Bearing NWB  Distance Ambulated (ft) 220 ft  Mobility Ambulated with assistance in hallway  Mobility Response Tolerated well  Mobility performed by Mobility specialist  Bed Position Semi-fowlers   Patient eager to participate with anticipation to be discharged soon. Ambulated in hallway min guard with steady gait. Required standing rest break x2. Tolerated ambulation well, returned to room without incident or complaint. Was left dangling EOB with all needs met.    03/17/2021 12:22 PM

## 2022-08-06 IMAGING — US US RENAL
1 series · 14 of 25 positions shown · non-contrast
Comparison: None.

CLINICAL DATA: Acute kidney injury

EXAM:
RENAL / URINARY TRACT ULTRASOUND COMPLETE

[Series 1: us renal · 14 of 35 slices shown]
[im 1/35]
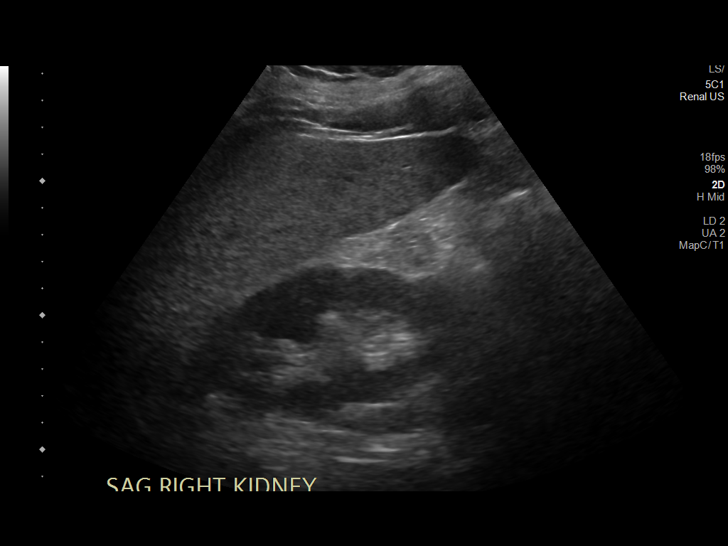
[im 3/35]
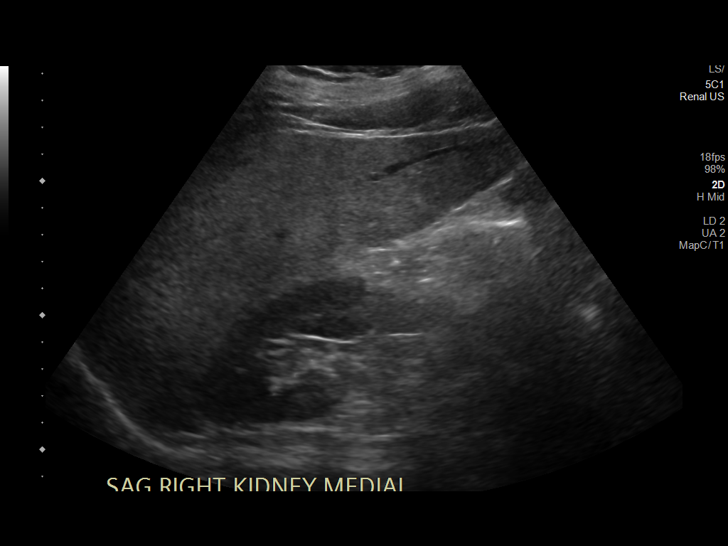
[im 6/35]
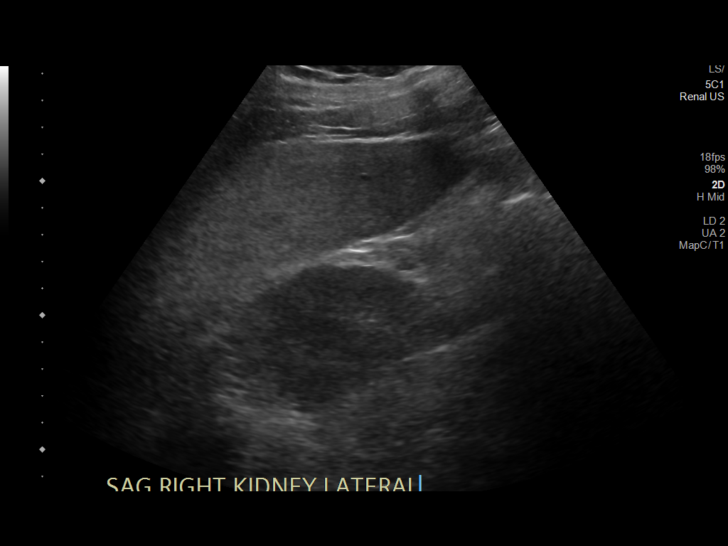
[im 9/35]
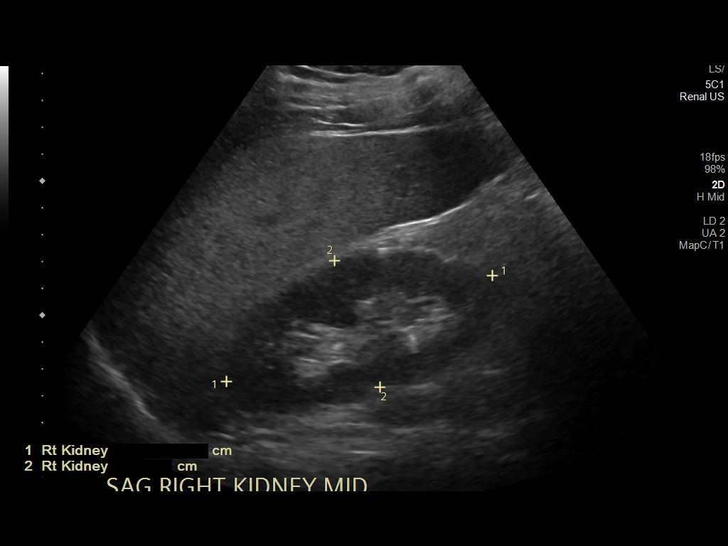
[im 12/35]
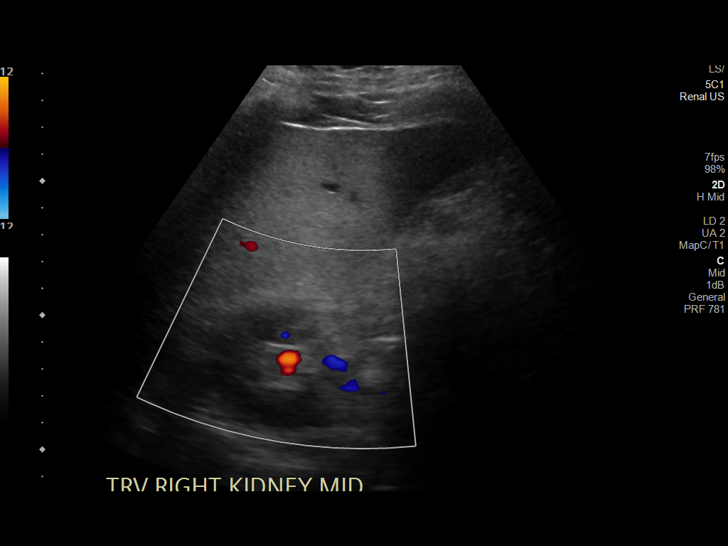
[im 13/35]
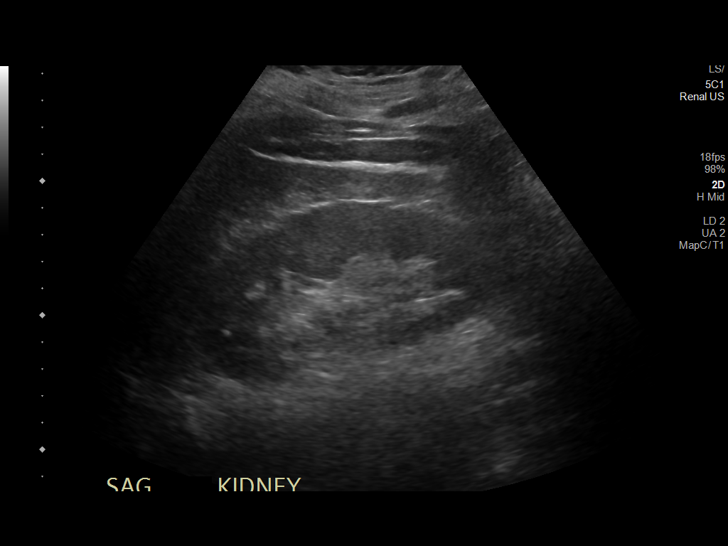
[im 16/35]
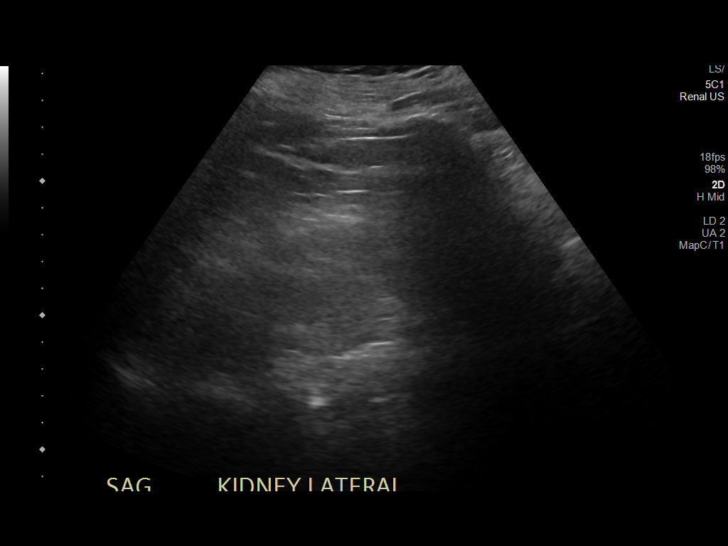
[im 19/35]
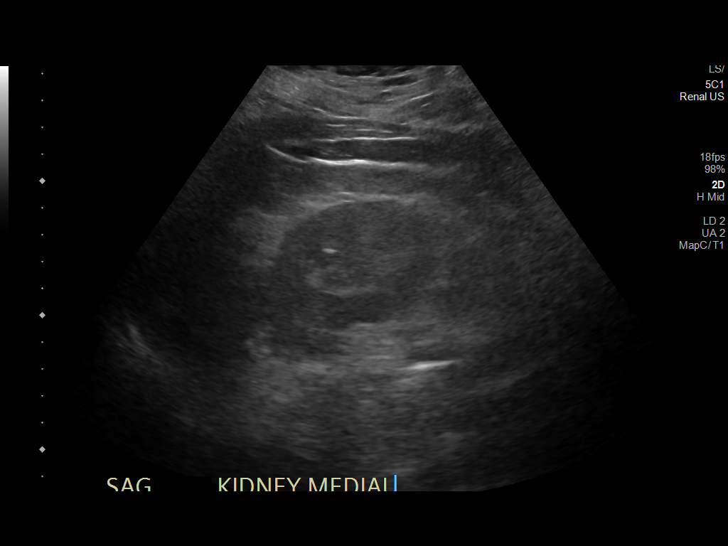
[im 22/35]
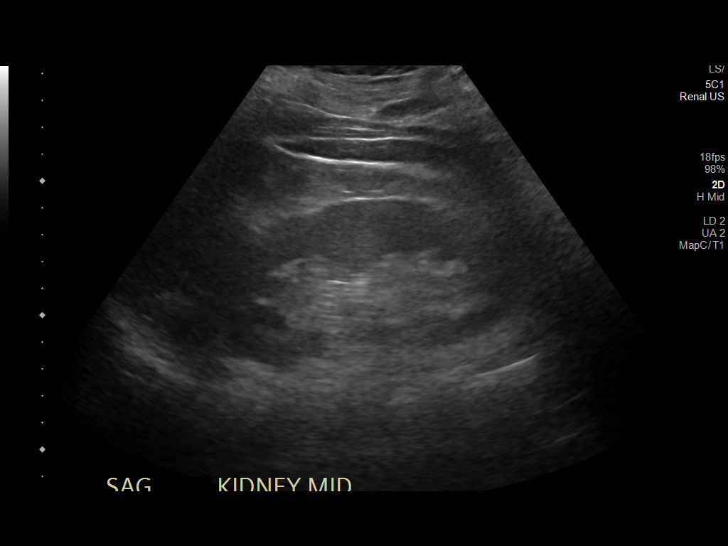
[im 23/35]
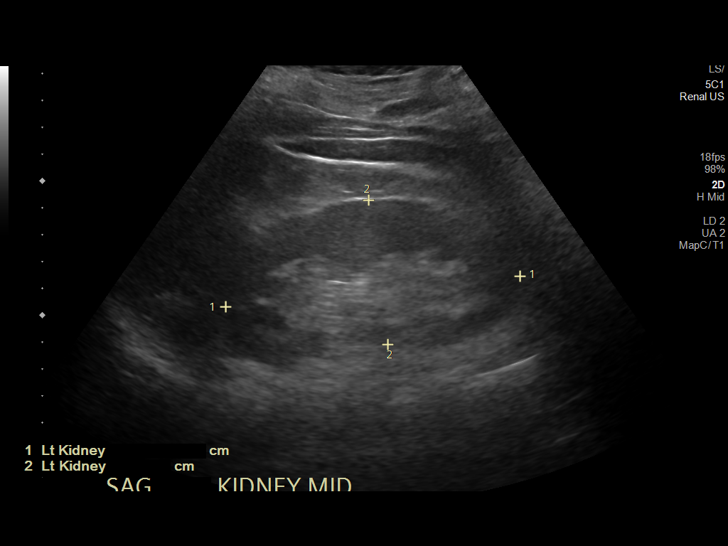
[im 26/35]
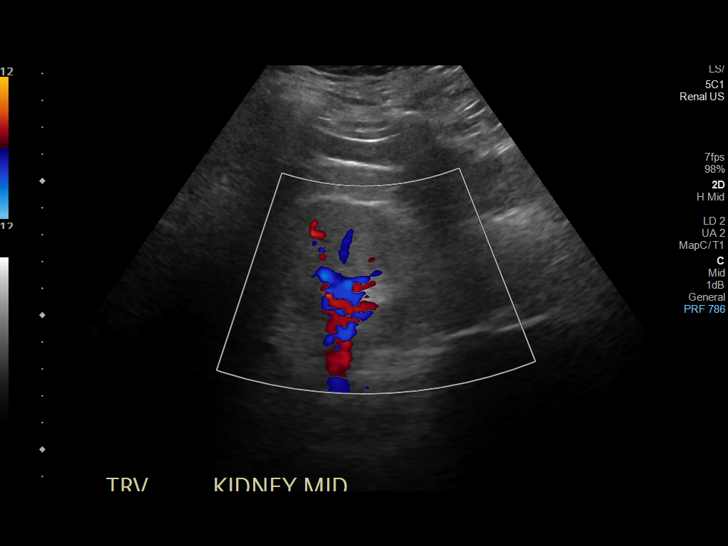
[im 29/35]
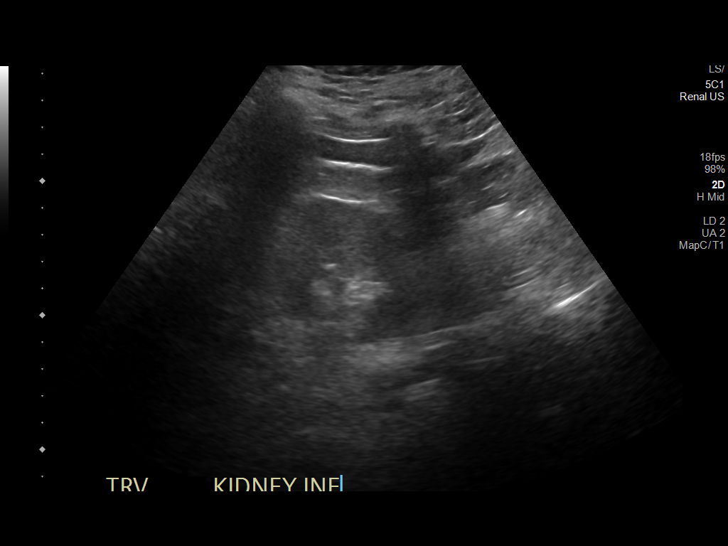
[im 32/35]
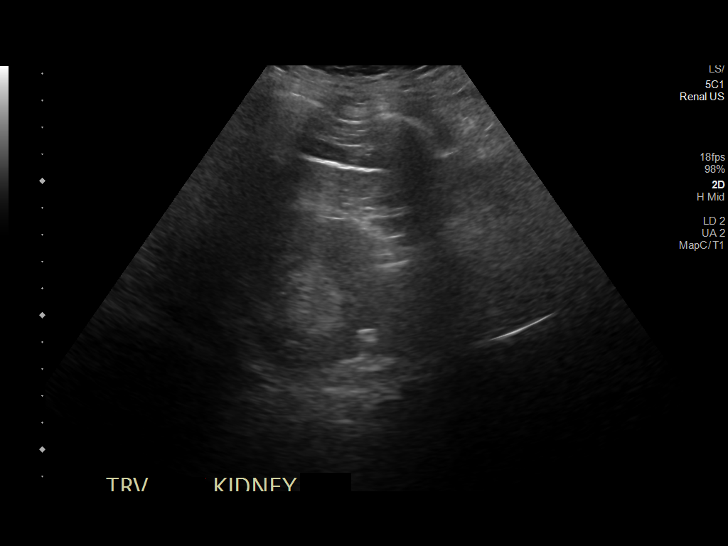
[im 35/35]
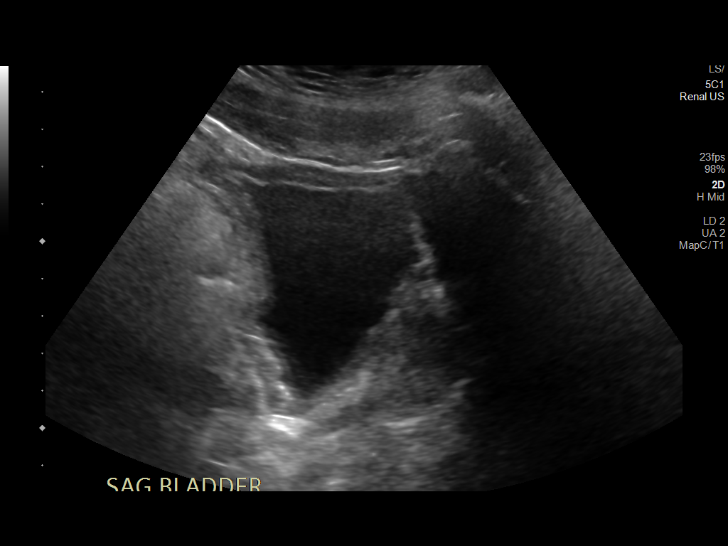

[14 of 25 positions shown; findings below may reference images not displayed]

FINDINGS: Right Kidney:

Renal measurements: 10.7 x 5.0 x 3.9 = volume: 109 mL. Echogenicity
within normal limits. No mass or hydronephrosis visualized.

Left Kidney:

Renal measurements: 11.0 x 5.4 x 4.7 = volume: 145 mL. Echogenicity
within normal limits. No mass or hydronephrosis visualized.

Bladder:

Appears normal for degree of bladder distention.

Other:

None.
IMPRESSION: Normal examination

## 2023-02-05 ENCOUNTER — Inpatient Hospital Stay
Admission: RE | Admit: 2023-02-05 | Payer: No Typology Code available for payment source | Source: Other Acute Inpatient Hospital

## 2023-02-05 ENCOUNTER — Other Ambulatory Visit (HOSPITAL_COMMUNITY): Payer: Self-pay

## 2023-02-05 ENCOUNTER — Inpatient Hospital Stay
Admission: RE | Admit: 2023-02-05 | Discharge: 2023-02-24 | Disposition: E | Payer: Self-pay | Attending: Internal Medicine | Admitting: Internal Medicine

## 2023-02-05 LAB — COMPREHENSIVE METABOLIC PANEL
ALT: 44 U/L (ref 0–44)
AST: 39 U/L (ref 15–41)
Albumin: 3.2 g/dL — ABNORMAL LOW (ref 3.5–5.0)
Alkaline Phosphatase: 108 U/L (ref 38–126)
Anion gap: 8 (ref 5–15)
BUN: 27 mg/dL — ABNORMAL HIGH (ref 6–20)
CO2: 24 mmol/L (ref 22–32)
Calcium: 10.1 mg/dL (ref 8.9–10.3)
Chloride: 107 mmol/L (ref 98–111)
Creatinine, Ser: 0.75 mg/dL (ref 0.61–1.24)
GFR, Estimated: >60 mL/min (ref >60)
Glucose, Bld: 173 mg/dL — ABNORMAL HIGH (ref 70–99)
Potassium: 4.1 mmol/L (ref 3.5–5.1)
Sodium: 139 mmol/L (ref 135–145)
Total Bilirubin: 0.7 mg/dL (ref <1.2)
Total Protein: 5.6 g/dL — ABNORMAL LOW (ref 6.5–8.1)

## 2023-02-05 LAB — CBC WITH DIFFERENTIAL/PLATELET
Abs Immature Granulocytes: 0.06 10*3/uL (ref 0.00–0.07)
Basophils Absolute: 0 10*3/uL (ref 0.0–0.1)
Basophils Relative: 0 %
Eosinophils Absolute: 0.1 10*3/uL (ref 0.0–0.5)
Eosinophils Relative: 1 %
HCT: 30.2 % — ABNORMAL LOW (ref 39.0–52.0)
Hemoglobin: 9.9 g/dL — ABNORMAL LOW (ref 13.0–17.0)
Immature Granulocytes: 1 %
Lymphocytes Relative: 28 %
Lymphs Abs: 1.4 10*3/uL (ref 0.7–4.0)
MCH: 33.1 pg (ref 26.0–34.0)
MCHC: 32.8 g/dL (ref 30.0–36.0)
MCV: 101 fL — ABNORMAL HIGH (ref 80.0–100.0)
Monocytes Absolute: 0.4 10*3/uL (ref 0.1–1.0)
Monocytes Relative: 9 %
Neutro Abs: 3 10*3/uL (ref 1.7–7.7)
Neutrophils Relative %: 61 %
Platelets: 52 10*3/uL — ABNORMAL LOW (ref 150–400)
RBC: 2.99 MIL/uL — ABNORMAL LOW (ref 4.22–5.81)
RDW: 16.6 % — ABNORMAL HIGH (ref 11.5–15.5)
WBC: 5 10*3/uL (ref 4.0–10.5)
nRBC: 0.6 % — ABNORMAL HIGH (ref 0.0–0.2)

## 2023-02-05 MED ORDER — DIATRIZOATE MEGLUMINE & SODIUM 66-10 % PO SOLN
30.0000 mL | Freq: Once | ORAL | Status: AC
  Administered 2023-02-05: 30 mL

## 2023-02-06 ENCOUNTER — Other Ambulatory Visit (HOSPITAL_COMMUNITY): Payer: Self-pay

## 2023-02-06 DIAGNOSIS — G40909 Epilepsy, unspecified, not intractable, without status epilepticus: Secondary | ICD-10-CM | POA: Diagnosis not present

## 2023-02-06 DIAGNOSIS — Z930 Tracheostomy status: Secondary | ICD-10-CM | POA: Diagnosis not present

## 2023-02-06 DIAGNOSIS — J9621 Acute and chronic respiratory failure with hypoxia: Secondary | ICD-10-CM | POA: Diagnosis not present

## 2023-02-06 DIAGNOSIS — I69298 Other sequelae of other nontraumatic intracranial hemorrhage: Secondary | ICD-10-CM | POA: Diagnosis not present

## 2023-02-06 LAB — BLOOD GAS, ARTERIAL
Acid-Base Excess: 3.7 mmol/L — ABNORMAL HIGH (ref 0.0–2.0)
Bicarbonate: 27.7 mmol/L (ref 20.0–28.0)
O2 Saturation: 99.3 %
Patient temperature: 35.9
pCO2 arterial: 37 mm[Hg] (ref 32–48)
pH, Arterial: 7.48 — ABNORMAL HIGH (ref 7.35–7.45)
pO2, Arterial: 100 mm[Hg] (ref 83–108)

## 2023-02-06 NOTE — Progress Notes (Signed)
   IR Note   G tube placed on outside facility Now malfunction per [facility]   11/12 Xray: Shows that contrast is intra-peritoneal, not within the bowel lumen.    Will order CT Abd w/o Cx per Dr Loreta Ave    Await results before a procedure.

## 2023-02-07 ENCOUNTER — Other Ambulatory Visit (HOSPITAL_COMMUNITY): Payer: Self-pay

## 2023-02-07 DIAGNOSIS — G40909 Epilepsy, unspecified, not intractable, without status epilepticus: Secondary | ICD-10-CM | POA: Diagnosis not present

## 2023-02-07 DIAGNOSIS — I69298 Other sequelae of other nontraumatic intracranial hemorrhage: Secondary | ICD-10-CM | POA: Diagnosis not present

## 2023-02-07 DIAGNOSIS — J9621 Acute and chronic respiratory failure with hypoxia: Secondary | ICD-10-CM | POA: Diagnosis not present

## 2023-02-07 DIAGNOSIS — Z930 Tracheostomy status: Secondary | ICD-10-CM | POA: Diagnosis not present

## 2023-02-07 NOTE — Progress Notes (Signed)
IR Note  Request made for evaluation of existing G tube (placed at outside facility) and new G tube placement  CT Abd yesterday: 1. [Examination is POSITIVE for free intraperitoneal air and a malpositioned gastrostomy tube, with tip lying anterior to the transverse colon. Consider urgent removal. 2. Anatomy is NOT amenable to percutaneous gastrostomy, with intervening viscera at gastric antrum and pylorus. 3. 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 Findings suspicious for discitis osteomyelitis, if continued clinical concern consider MR L-spine for further evaluation.]   Discussed with Dr Milford Cage Rec: [facility] to remove existing G tube now Pt NOT a candidate for IR placement of percutaneous gastric tube Consider Surgical consult for placement

## 2023-02-08 ENCOUNTER — Other Ambulatory Visit (HOSPITAL_COMMUNITY): Payer: Self-pay

## 2023-02-08 DIAGNOSIS — I69298 Other sequelae of other nontraumatic intracranial hemorrhage: Secondary | ICD-10-CM | POA: Diagnosis not present

## 2023-02-08 DIAGNOSIS — J9621 Acute and chronic respiratory failure with hypoxia: Secondary | ICD-10-CM | POA: Diagnosis not present

## 2023-02-08 DIAGNOSIS — G40909 Epilepsy, unspecified, not intractable, without status epilepticus: Secondary | ICD-10-CM | POA: Diagnosis not present

## 2023-02-08 DIAGNOSIS — Z930 Tracheostomy status: Secondary | ICD-10-CM | POA: Diagnosis not present

## 2023-02-08 LAB — URINALYSIS, W/ REFLEX TO CULTURE (INFECTION SUSPECTED)
Bilirubin Urine: NEGATIVE
Glucose, UA: NEGATIVE mg/dL
Ketones, ur: 5 mg/dL — AB
Nitrite: NEGATIVE
Protein, ur: 100 mg/dL — AB
RBC / HPF: >50 RBC/hpf (ref 0–5)
Specific Gravity, Urine: 1.027 (ref 1.005–1.030)
WBC, UA: >50 WBC/hpf (ref 0–5)
pH: 5 (ref 5.0–8.0)

## 2023-02-08 LAB — MAGNESIUM: Magnesium: 2.1 mg/dL (ref 1.7–2.4)

## 2023-02-08 MED ORDER — DIATRIZOATE MEGLUMINE & SODIUM 66-10 % PO SOLN
30.0000 mL | Freq: Once | ORAL | Status: AC
  Administered 2023-02-08: 30 mL

## 2023-02-08 MED ORDER — DIATRIZOATE MEGLUMINE & SODIUM 66-10 % PO SOLN
ORAL | Status: AC
  Filled 2023-02-08: qty 30

## 2023-02-09 ENCOUNTER — Other Ambulatory Visit (HOSPITAL_COMMUNITY): Payer: Self-pay

## 2023-02-09 DIAGNOSIS — Z930 Tracheostomy status: Secondary | ICD-10-CM | POA: Diagnosis not present

## 2023-02-09 DIAGNOSIS — G40909 Epilepsy, unspecified, not intractable, without status epilepticus: Secondary | ICD-10-CM | POA: Diagnosis not present

## 2023-02-09 DIAGNOSIS — I69298 Other sequelae of other nontraumatic intracranial hemorrhage: Secondary | ICD-10-CM | POA: Diagnosis not present

## 2023-02-09 DIAGNOSIS — J9621 Acute and chronic respiratory failure with hypoxia: Secondary | ICD-10-CM | POA: Diagnosis not present

## 2023-02-09 LAB — BASIC METABOLIC PANEL
Anion gap: 9 (ref 5–15)
BUN: 52 mg/dL — ABNORMAL HIGH (ref 6–20)
CO2: 23 mmol/L (ref 22–32)
Calcium: 9.6 mg/dL (ref 8.9–10.3)
Chloride: 108 mmol/L (ref 98–111)
Creatinine, Ser: 3.63 mg/dL — ABNORMAL HIGH (ref 0.61–1.24)
GFR, Estimated: 19 mL/min — ABNORMAL LOW (ref >60)
Glucose, Bld: 158 mg/dL — ABNORMAL HIGH (ref 70–99)
Potassium: 4.1 mmol/L (ref 3.5–5.1)
Sodium: 140 mmol/L (ref 135–145)

## 2023-02-09 LAB — CBC
HCT: 33.8 % — ABNORMAL LOW (ref 39.0–52.0)
Hemoglobin: 10.8 g/dL — ABNORMAL LOW (ref 13.0–17.0)
MCH: 33.9 pg (ref 26.0–34.0)
MCHC: 32 g/dL (ref 30.0–36.0)
MCV: 106 fL — ABNORMAL HIGH (ref 80.0–100.0)
Platelets: 48 10*3/uL — ABNORMAL LOW (ref 150–400)
RBC: 3.19 MIL/uL — ABNORMAL LOW (ref 4.22–5.81)
RDW: 17.1 % — ABNORMAL HIGH (ref 11.5–15.5)
WBC: 6.2 10*3/uL (ref 4.0–10.5)
nRBC: 0.5 % — ABNORMAL HIGH (ref 0.0–0.2)

## 2023-02-09 LAB — CULTURE, OB URINE: Culture: 40000 — AB

## 2023-02-10 DIAGNOSIS — J9621 Acute and chronic respiratory failure with hypoxia: Secondary | ICD-10-CM | POA: Diagnosis not present

## 2023-02-10 DIAGNOSIS — G40909 Epilepsy, unspecified, not intractable, without status epilepticus: Secondary | ICD-10-CM | POA: Diagnosis not present

## 2023-02-10 DIAGNOSIS — Z930 Tracheostomy status: Secondary | ICD-10-CM | POA: Diagnosis not present

## 2023-02-10 DIAGNOSIS — I69298 Other sequelae of other nontraumatic intracranial hemorrhage: Secondary | ICD-10-CM | POA: Diagnosis not present

## 2023-02-10 LAB — BASIC METABOLIC PANEL
Anion gap: 9 (ref 5–15)
BUN: 58 mg/dL — ABNORMAL HIGH (ref 6–20)
CO2: 23 mmol/L (ref 22–32)
Calcium: 9.1 mg/dL (ref 8.9–10.3)
Chloride: 114 mmol/L — ABNORMAL HIGH (ref 98–111)
Creatinine, Ser: 3.39 mg/dL — ABNORMAL HIGH (ref 0.61–1.24)
GFR, Estimated: 21 mL/min — ABNORMAL LOW (ref >60)
Glucose, Bld: 146 mg/dL — ABNORMAL HIGH (ref 70–99)
Potassium: 3.6 mmol/L (ref 3.5–5.1)
Sodium: 146 mmol/L — ABNORMAL HIGH (ref 135–145)

## 2023-02-10 LAB — URINE CULTURE: Culture: 40000 — AB

## 2023-02-11 ENCOUNTER — Other Ambulatory Visit (HOSPITAL_COMMUNITY): Payer: Self-pay

## 2023-02-11 DIAGNOSIS — G40909 Epilepsy, unspecified, not intractable, without status epilepticus: Secondary | ICD-10-CM | POA: Diagnosis not present

## 2023-02-11 DIAGNOSIS — J9621 Acute and chronic respiratory failure with hypoxia: Secondary | ICD-10-CM | POA: Diagnosis not present

## 2023-02-11 DIAGNOSIS — I69298 Other sequelae of other nontraumatic intracranial hemorrhage: Secondary | ICD-10-CM | POA: Diagnosis not present

## 2023-02-11 DIAGNOSIS — Z930 Tracheostomy status: Secondary | ICD-10-CM | POA: Diagnosis not present

## 2023-02-11 LAB — CULTURE, RESPIRATORY W GRAM STAIN

## 2023-02-12 DIAGNOSIS — J9621 Acute and chronic respiratory failure with hypoxia: Secondary | ICD-10-CM | POA: Diagnosis not present

## 2023-02-12 DIAGNOSIS — Z930 Tracheostomy status: Secondary | ICD-10-CM | POA: Diagnosis not present

## 2023-02-12 DIAGNOSIS — I69298 Other sequelae of other nontraumatic intracranial hemorrhage: Secondary | ICD-10-CM | POA: Diagnosis not present

## 2023-02-12 DIAGNOSIS — G40909 Epilepsy, unspecified, not intractable, without status epilepticus: Secondary | ICD-10-CM | POA: Diagnosis not present

## 2023-02-13 DIAGNOSIS — Z930 Tracheostomy status: Secondary | ICD-10-CM | POA: Diagnosis not present

## 2023-02-13 DIAGNOSIS — I69298 Other sequelae of other nontraumatic intracranial hemorrhage: Secondary | ICD-10-CM | POA: Diagnosis not present

## 2023-02-13 DIAGNOSIS — J9621 Acute and chronic respiratory failure with hypoxia: Secondary | ICD-10-CM | POA: Diagnosis not present

## 2023-02-13 DIAGNOSIS — G40909 Epilepsy, unspecified, not intractable, without status epilepticus: Secondary | ICD-10-CM | POA: Diagnosis not present

## 2023-02-13 LAB — BASIC METABOLIC PANEL
Anion gap: 6 (ref 5–15)
BUN: 39 mg/dL — ABNORMAL HIGH (ref 6–20)
CO2: 25 mmol/L (ref 22–32)
Calcium: 9.6 mg/dL (ref 8.9–10.3)
Chloride: 120 mmol/L — ABNORMAL HIGH (ref 98–111)
Creatinine, Ser: 1.33 mg/dL — ABNORMAL HIGH (ref 0.61–1.24)
GFR, Estimated: >60 mL/min (ref >60)
Glucose, Bld: 78 mg/dL (ref 70–99)
Potassium: 3.2 mmol/L — ABNORMAL LOW (ref 3.5–5.1)
Sodium: 151 mmol/L — ABNORMAL HIGH (ref 135–145)

## 2023-02-13 LAB — CBC
HCT: 27.3 % — ABNORMAL LOW (ref 39.0–52.0)
Hemoglobin: 8.8 g/dL — ABNORMAL LOW (ref 13.0–17.0)
MCH: 32.7 pg (ref 26.0–34.0)
MCHC: 32.2 g/dL (ref 30.0–36.0)
MCV: 101.5 fL — ABNORMAL HIGH (ref 80.0–100.0)
Platelets: 38 10*3/uL — ABNORMAL LOW (ref 150–400)
RBC: 2.69 MIL/uL — ABNORMAL LOW (ref 4.22–5.81)
RDW: 16.1 % — ABNORMAL HIGH (ref 11.5–15.5)
WBC: 6.9 10*3/uL (ref 4.0–10.5)
nRBC: 0 % (ref 0.0–0.2)

## 2023-02-14 DIAGNOSIS — Z930 Tracheostomy status: Secondary | ICD-10-CM | POA: Diagnosis not present

## 2023-02-14 DIAGNOSIS — G40909 Epilepsy, unspecified, not intractable, without status epilepticus: Secondary | ICD-10-CM | POA: Diagnosis not present

## 2023-02-14 DIAGNOSIS — J9621 Acute and chronic respiratory failure with hypoxia: Secondary | ICD-10-CM | POA: Diagnosis not present

## 2023-02-14 DIAGNOSIS — I69298 Other sequelae of other nontraumatic intracranial hemorrhage: Secondary | ICD-10-CM | POA: Diagnosis not present

## 2023-02-14 LAB — COMPREHENSIVE METABOLIC PANEL
ALT: 26 U/L (ref 0–44)
AST: 31 U/L (ref 15–41)
Albumin: 2.9 g/dL — ABNORMAL LOW (ref 3.5–5.0)
Alkaline Phosphatase: 93 U/L (ref 38–126)
Anion gap: 6 (ref 5–15)
BUN: 37 mg/dL — ABNORMAL HIGH (ref 6–20)
CO2: 24 mmol/L (ref 22–32)
Calcium: 9.2 mg/dL (ref 8.9–10.3)
Chloride: 116 mmol/L — ABNORMAL HIGH (ref 98–111)
Creatinine, Ser: 1.12 mg/dL (ref 0.61–1.24)
GFR, Estimated: >60 mL/min (ref >60)
Glucose, Bld: 150 mg/dL — ABNORMAL HIGH (ref 70–99)
Potassium: 3.5 mmol/L (ref 3.5–5.1)
Sodium: 146 mmol/L — ABNORMAL HIGH (ref 135–145)
Total Bilirubin: 0.6 mg/dL (ref <1.2)
Total Protein: 4.8 g/dL — ABNORMAL LOW (ref 6.5–8.1)

## 2023-02-14 LAB — CBC
HCT: 26.7 % — ABNORMAL LOW (ref 39.0–52.0)
Hemoglobin: 9.1 g/dL — ABNORMAL LOW (ref 13.0–17.0)
MCH: 34.7 pg — ABNORMAL HIGH (ref 26.0–34.0)
MCHC: 34.1 g/dL (ref 30.0–36.0)
MCV: 101.9 fL — ABNORMAL HIGH (ref 80.0–100.0)
Platelets: 40 10*3/uL — ABNORMAL LOW (ref 150–400)
RBC: 2.62 MIL/uL — ABNORMAL LOW (ref 4.22–5.81)
RDW: 15.9 % — ABNORMAL HIGH (ref 11.5–15.5)
WBC: 7.7 10*3/uL (ref 4.0–10.5)
nRBC: 0.3 % — ABNORMAL HIGH (ref 0.0–0.2)

## 2023-02-15 DIAGNOSIS — J9621 Acute and chronic respiratory failure with hypoxia: Secondary | ICD-10-CM | POA: Diagnosis not present

## 2023-02-15 DIAGNOSIS — Z930 Tracheostomy status: Secondary | ICD-10-CM | POA: Diagnosis not present

## 2023-02-15 DIAGNOSIS — I69298 Other sequelae of other nontraumatic intracranial hemorrhage: Secondary | ICD-10-CM

## 2023-02-15 DIAGNOSIS — G40909 Epilepsy, unspecified, not intractable, without status epilepticus: Secondary | ICD-10-CM

## 2023-02-16 DIAGNOSIS — J9621 Acute and chronic respiratory failure with hypoxia: Secondary | ICD-10-CM

## 2023-02-16 DIAGNOSIS — I69298 Other sequelae of other nontraumatic intracranial hemorrhage: Secondary | ICD-10-CM

## 2023-02-16 DIAGNOSIS — G40909 Epilepsy, unspecified, not intractable, without status epilepticus: Secondary | ICD-10-CM

## 2023-02-16 DIAGNOSIS — Z930 Tracheostomy status: Secondary | ICD-10-CM | POA: Diagnosis not present

## 2023-02-17 DIAGNOSIS — Z930 Tracheostomy status: Secondary | ICD-10-CM | POA: Diagnosis not present

## 2023-02-17 DIAGNOSIS — I69298 Other sequelae of other nontraumatic intracranial hemorrhage: Secondary | ICD-10-CM | POA: Diagnosis not present

## 2023-02-17 DIAGNOSIS — J9621 Acute and chronic respiratory failure with hypoxia: Secondary | ICD-10-CM

## 2023-02-17 DIAGNOSIS — G40909 Epilepsy, unspecified, not intractable, without status epilepticus: Secondary | ICD-10-CM

## 2023-02-17 LAB — COMPREHENSIVE METABOLIC PANEL
ALT: 25 U/L (ref 0–44)
AST: 23 U/L (ref 15–41)
Albumin: 2.9 g/dL — ABNORMAL LOW (ref 3.5–5.0)
Alkaline Phosphatase: 100 U/L (ref 38–126)
Anion gap: 10 (ref 5–15)
BUN: 31 mg/dL — ABNORMAL HIGH (ref 6–20)
CO2: 24 mmol/L (ref 22–32)
Calcium: 9.2 mg/dL (ref 8.9–10.3)
Chloride: 105 mmol/L (ref 98–111)
Creatinine, Ser: 1.04 mg/dL (ref 0.61–1.24)
GFR, Estimated: >60 mL/min (ref >60)
Glucose, Bld: 192 mg/dL — ABNORMAL HIGH (ref 70–99)
Potassium: 4.3 mmol/L (ref 3.5–5.1)
Sodium: 139 mmol/L (ref 135–145)
Total Bilirubin: 0.2 mg/dL (ref <1.2)
Total Protein: 4.9 g/dL — ABNORMAL LOW (ref 6.5–8.1)

## 2023-02-17 LAB — CBC
HCT: 28 % — ABNORMAL LOW (ref 39.0–52.0)
Hemoglobin: 9.4 g/dL — ABNORMAL LOW (ref 13.0–17.0)
MCH: 34.3 pg — ABNORMAL HIGH (ref 26.0–34.0)
MCHC: 33.6 g/dL (ref 30.0–36.0)
MCV: 102.2 fL — ABNORMAL HIGH (ref 80.0–100.0)
Platelets: 56 10*3/uL — ABNORMAL LOW (ref 150–400)
RBC: 2.74 MIL/uL — ABNORMAL LOW (ref 4.22–5.81)
RDW: 15.9 % — ABNORMAL HIGH (ref 11.5–15.5)
WBC: 13.5 10*3/uL — ABNORMAL HIGH (ref 4.0–10.5)
nRBC: 0 % (ref 0.0–0.2)

## 2023-02-17 LAB — MAGNESIUM: Magnesium: 1.4 mg/dL — ABNORMAL LOW (ref 1.7–2.4)

## 2023-02-18 ENCOUNTER — Other Ambulatory Visit (HOSPITAL_COMMUNITY): Payer: Self-pay

## 2023-02-18 DIAGNOSIS — J9621 Acute and chronic respiratory failure with hypoxia: Secondary | ICD-10-CM | POA: Diagnosis not present

## 2023-02-18 DIAGNOSIS — Z930 Tracheostomy status: Secondary | ICD-10-CM | POA: Diagnosis not present

## 2023-02-18 DIAGNOSIS — I69298 Other sequelae of other nontraumatic intracranial hemorrhage: Secondary | ICD-10-CM | POA: Diagnosis not present

## 2023-02-18 DIAGNOSIS — G40909 Epilepsy, unspecified, not intractable, without status epilepticus: Secondary | ICD-10-CM | POA: Diagnosis not present

## 2023-02-18 LAB — CBC
HCT: 34.4 % — ABNORMAL LOW (ref 39.0–52.0)
Hemoglobin: 10.9 g/dL — ABNORMAL LOW (ref 13.0–17.0)
MCH: 33.2 pg (ref 26.0–34.0)
MCHC: 31.7 g/dL (ref 30.0–36.0)
MCV: 104.9 fL — ABNORMAL HIGH (ref 80.0–100.0)
Platelets: 72 10*3/uL — ABNORMAL LOW (ref 150–400)
RBC: 3.28 MIL/uL — ABNORMAL LOW (ref 4.22–5.81)
RDW: 16.2 % — ABNORMAL HIGH (ref 11.5–15.5)
WBC: 14.7 10*3/uL — ABNORMAL HIGH (ref 4.0–10.5)
nRBC: 0.1 % (ref 0.0–0.2)

## 2023-02-18 LAB — BLOOD GAS, ARTERIAL
Acid-Base Excess: 0.8 mmol/L (ref 0.0–2.0)
Acid-base deficit: 0.2 mmol/L (ref 0.0–2.0)
Bicarbonate: 25.3 mmol/L (ref 20.0–28.0)
Bicarbonate: 22.6 mmol/L (ref 20.0–28.0)
O2 Saturation: 99.5 %
O2 Saturation: 98.1 %
Patient temperature: 38.4
Patient temperature: 37.2
pCO2 arterial: 42 mm[Hg] (ref 32–48)
pCO2 arterial: 31 mm[Hg] — ABNORMAL LOW (ref 32–48)
pH, Arterial: 7.4 (ref 7.35–7.45)
pH, Arterial: 7.47 — ABNORMAL HIGH (ref 7.35–7.45)
pO2, Arterial: 103 mm[Hg] (ref 83–108)
pO2, Arterial: 86 mm[Hg] (ref 83–108)

## 2023-02-18 LAB — COMPREHENSIVE METABOLIC PANEL
ALT: 23 U/L (ref 0–44)
AST: 25 U/L (ref 15–41)
Albumin: 3.2 g/dL — ABNORMAL LOW (ref 3.5–5.0)
Alkaline Phosphatase: 82 U/L (ref 38–126)
Anion gap: 14 (ref 5–15)
BUN: 40 mg/dL — ABNORMAL HIGH (ref 6–20)
CO2: 21 mmol/L — ABNORMAL LOW (ref 22–32)
Calcium: 9.5 mg/dL (ref 8.9–10.3)
Chloride: 104 mmol/L (ref 98–111)
Creatinine, Ser: 1.63 mg/dL — ABNORMAL HIGH (ref 0.61–1.24)
GFR, Estimated: 50 mL/min — ABNORMAL LOW (ref >60)
Glucose, Bld: 198 mg/dL — ABNORMAL HIGH (ref 70–99)
Potassium: 5.3 mmol/L — ABNORMAL HIGH (ref 3.5–5.1)
Sodium: 139 mmol/L (ref 135–145)
Total Bilirubin: 0.6 mg/dL (ref <1.2)
Total Protein: 5.2 g/dL — ABNORMAL LOW (ref 6.5–8.1)

## 2023-02-18 LAB — URINALYSIS, ROUTINE W REFLEX MICROSCOPIC
Bilirubin Urine: NEGATIVE
Glucose, UA: NEGATIVE mg/dL
Ketones, ur: 5 mg/dL — AB
Nitrite: NEGATIVE
Protein, ur: 30 mg/dL — AB
Specific Gravity, Urine: 1.024 (ref 1.005–1.030)
WBC, UA: >50 WBC/hpf (ref 0–5)
pH: 5 (ref 5.0–8.0)

## 2023-02-18 LAB — TROPONIN I (HIGH SENSITIVITY): Troponin I (High Sensitivity): 33 ng/L — ABNORMAL HIGH (ref <18)

## 2023-02-18 LAB — MAGNESIUM: Magnesium: 1.5 mg/dL — ABNORMAL LOW (ref 1.7–2.4)

## 2023-02-18 LAB — BRAIN NATRIURETIC PEPTIDE: B Natriuretic Peptide: 99.1 pg/mL (ref 0.0–100.0)

## 2023-02-19 DIAGNOSIS — G40909 Epilepsy, unspecified, not intractable, without status epilepticus: Secondary | ICD-10-CM | POA: Diagnosis not present

## 2023-02-19 DIAGNOSIS — I69298 Other sequelae of other nontraumatic intracranial hemorrhage: Secondary | ICD-10-CM | POA: Diagnosis not present

## 2023-02-19 DIAGNOSIS — Z930 Tracheostomy status: Secondary | ICD-10-CM | POA: Diagnosis not present

## 2023-02-19 DIAGNOSIS — J9621 Acute and chronic respiratory failure with hypoxia: Secondary | ICD-10-CM | POA: Diagnosis not present

## 2023-02-20 ENCOUNTER — Institutional Professional Consult (permissible substitution) (HOSPITAL_COMMUNITY): Payer: Self-pay

## 2023-02-20 DIAGNOSIS — J9621 Acute and chronic respiratory failure with hypoxia: Secondary | ICD-10-CM | POA: Diagnosis not present

## 2023-02-20 DIAGNOSIS — I69298 Other sequelae of other nontraumatic intracranial hemorrhage: Secondary | ICD-10-CM | POA: Diagnosis not present

## 2023-02-20 DIAGNOSIS — G40909 Epilepsy, unspecified, not intractable, without status epilepticus: Secondary | ICD-10-CM | POA: Diagnosis not present

## 2023-02-20 DIAGNOSIS — Z930 Tracheostomy status: Secondary | ICD-10-CM | POA: Diagnosis not present

## 2023-02-20 HISTORY — PX: IR THORACENTESIS ASP PLEURAL SPACE W/IMG GUIDE: IMG5380

## 2023-02-20 LAB — CBC
HCT: 28.8 % — ABNORMAL LOW (ref 39.0–52.0)
Hemoglobin: 9.5 g/dL — ABNORMAL LOW (ref 13.0–17.0)
MCH: 33.5 pg (ref 26.0–34.0)
MCHC: 33 g/dL (ref 30.0–36.0)
MCV: 101.4 fL — ABNORMAL HIGH (ref 80.0–100.0)
Platelets: 66 10*3/uL — ABNORMAL LOW (ref 150–400)
RBC: 2.84 MIL/uL — ABNORMAL LOW (ref 4.22–5.81)
RDW: 16.4 % — ABNORMAL HIGH (ref 11.5–15.5)
WBC: 14.7 10*3/uL — ABNORMAL HIGH (ref 4.0–10.5)
nRBC: 0 % (ref 0.0–0.2)

## 2023-02-20 LAB — MAGNESIUM: Magnesium: 2.2 mg/dL (ref 1.7–2.4)

## 2023-02-20 LAB — BASIC METABOLIC PANEL
Anion gap: 13 (ref 5–15)
BUN: 75 mg/dL — ABNORMAL HIGH (ref 6–20)
CO2: 22 mmol/L (ref 22–32)
Calcium: 8.7 mg/dL — ABNORMAL LOW (ref 8.9–10.3)
Chloride: 97 mmol/L — ABNORMAL LOW (ref 98–111)
Creatinine, Ser: 2.69 mg/dL — ABNORMAL HIGH (ref 0.61–1.24)
GFR, Estimated: 27 mL/min — ABNORMAL LOW (ref >60)
Glucose, Bld: 181 mg/dL — ABNORMAL HIGH (ref 70–99)
Potassium: 5 mmol/L (ref 3.5–5.1)
Sodium: 132 mmol/L — ABNORMAL LOW (ref 135–145)

## 2023-02-20 MED ORDER — LIDOCAINE HCL 1 % IJ SOLN
20.0000 mL | Freq: Once | INTRAMUSCULAR | Status: AC
  Administered 2023-02-20: 10 mL

## 2023-02-21 ENCOUNTER — Institutional Professional Consult (permissible substitution) (HOSPITAL_COMMUNITY): Payer: Self-pay

## 2023-02-21 DIAGNOSIS — G40909 Epilepsy, unspecified, not intractable, without status epilepticus: Secondary | ICD-10-CM | POA: Diagnosis not present

## 2023-02-21 DIAGNOSIS — J9621 Acute and chronic respiratory failure with hypoxia: Secondary | ICD-10-CM | POA: Diagnosis not present

## 2023-02-21 DIAGNOSIS — I69298 Other sequelae of other nontraumatic intracranial hemorrhage: Secondary | ICD-10-CM | POA: Diagnosis not present

## 2023-02-21 DIAGNOSIS — Z930 Tracheostomy status: Secondary | ICD-10-CM | POA: Diagnosis not present

## 2023-02-21 LAB — BLOOD GAS, ARTERIAL
Acid-base deficit: 1.1 mmol/L (ref 0.0–2.0)
Bicarbonate: 23.5 mmol/L (ref 20.0–28.0)
O2 Saturation: 93.4 %
Patient temperature: 37
pCO2 arterial: 38 mm[Hg] (ref 32–48)
pH, Arterial: 7.4 (ref 7.35–7.45)
pO2, Arterial: 65 mm[Hg] — ABNORMAL LOW (ref 83–108)

## 2023-02-21 LAB — CULTURE, RESPIRATORY W GRAM STAIN

## 2023-02-21 LAB — POTASSIUM: Potassium: 4.1 mmol/L (ref 3.5–5.1)

## 2023-02-23 LAB — CULTURE, BLOOD (ROUTINE X 2)
Culture: NO GROWTH
Culture: NO GROWTH
Special Requests: ADEQUATE
Special Requests: ADEQUATE

## 2023-02-24 DEATH — deceased

## 2023-02-25 LAB — URINE CULTURE: Culture: 60000 — AB

## 2023-04-09 MED FILL — Medication: Qty: 1 | Status: AC
